# Patient Record
Sex: Male | Born: 1950 | Race: Asian | Hispanic: No | Marital: Married | Smoking: Former smoker
Health system: Southern US, Community
[De-identification: ages and names within clinical notes are randomized; demographics above are authoritative.]

## PROBLEM LIST (undated history)

## (undated) DIAGNOSIS — A809 Acute poliomyelitis, unspecified: Secondary | ICD-10-CM

## (undated) DIAGNOSIS — I10 Essential (primary) hypertension: Secondary | ICD-10-CM

## (undated) DIAGNOSIS — I639 Cerebral infarction, unspecified: Secondary | ICD-10-CM

## (undated) HISTORY — PX: NO PAST SURGERIES: SHX2092

## (undated) HISTORY — DX: Cerebral infarction, unspecified: I63.9

---

## 2006-04-21 ENCOUNTER — Ambulatory Visit: Payer: Self-pay | Admitting: Cardiology

## 2006-04-26 ENCOUNTER — Ambulatory Visit: Payer: Self-pay

## 2006-05-20 ENCOUNTER — Ambulatory Visit: Payer: Self-pay | Admitting: Cardiology

## 2006-06-03 ENCOUNTER — Ambulatory Visit: Payer: Self-pay | Admitting: Cardiology

## 2006-06-03 LAB — CONVERTED CEMR LAB
Chloride: 106 meq/L (ref 96–112)
Creatinine, Ser: 0.4 mg/dL (ref 0.4–1.5)
Glucose, Bld: 106 mg/dL — ABNORMAL HIGH (ref 70–99)
Potassium: 3.9 meq/L (ref 3.5–5.1)
Sodium: 142 meq/L (ref 135–145)

## 2009-07-22 ENCOUNTER — Encounter (INDEPENDENT_AMBULATORY_CARE_PROVIDER_SITE_OTHER): Payer: Self-pay | Admitting: *Deleted

## 2010-05-12 NOTE — Letter (Signed)
Summary: Colonoscopy Date Change Letter  [facility]  [address] [place], [place] [address]   Phone: [phone]  Fax: [phone]      July 22, 2009 MRN: [id number]   [facility] [address] [place], [place]  [address]   Dear Mr. Boyde,   Previously you were recommended to have a repeat colonoscopy around this time. Your chart was recently reviewed by Dr. Judie Petit T. Russella Dar of [facility]. Follow up colonoscopy is now recommended in March 2014. This revised recommendation is based on current, nationally recognized guidelines for colorectal cancer screening and polyp surveillance. These guidelines are endorsed by the American Cancer Society, [employer] on Colorectal Cancer as well as numerous other major medical organizations.  Please understand that our recommendation assumes that you do not have any new symptoms such as bleeding, a change in bowel habits, anemia, or significant abdominal discomfort. If you do have any concerning GI symptoms or want to discuss the guideline recommendations, please call to arrange an office visit at your earliest convenience. Otherwise we will keep you in our reminder system and contact you 1-2 months prior to the date listed above to schedule your next colonoscopy.  Thank you,  Judie Petit T. Russella Dar, M.D.  [facility] [phone]

## 2010-11-17 ENCOUNTER — Emergency Department (HOSPITAL_COMMUNITY): Payer: Medicare Other

## 2010-11-17 ENCOUNTER — Inpatient Hospital Stay (HOSPITAL_COMMUNITY)
Admission: EM | Admit: 2010-11-17 | Discharge: 2010-11-20 | DRG: 066 | Disposition: A | Discharge status: Home Health | Payer: Medicare Other | Attending: Neurology | Admitting: Neurology

## 2010-11-17 DIAGNOSIS — I619 Nontraumatic intracerebral hemorrhage, unspecified: Principal | ICD-10-CM | POA: Diagnosis present

## 2010-11-17 DIAGNOSIS — Z8673 Personal history of transient ischemic attack (TIA), and cerebral infarction without residual deficits: Secondary | ICD-10-CM

## 2010-11-17 DIAGNOSIS — Z8612 Personal history of poliomyelitis: Secondary | ICD-10-CM

## 2010-11-17 DIAGNOSIS — G8389 Other specified paralytic syndromes: Secondary | ICD-10-CM | POA: Diagnosis present

## 2010-11-17 DIAGNOSIS — I10 Essential (primary) hypertension: Secondary | ICD-10-CM | POA: Diagnosis present

## 2010-11-17 DIAGNOSIS — M216X9 Other acquired deformities of unspecified foot: Secondary | ICD-10-CM | POA: Diagnosis present

## 2010-11-17 DIAGNOSIS — F17200 Nicotine dependence, unspecified, uncomplicated: Secondary | ICD-10-CM | POA: Diagnosis present

## 2010-11-17 LAB — COMPREHENSIVE METABOLIC PANEL
Alkaline Phosphatase: 59 U/L (ref 39–117)
BUN: 21 mg/dL (ref 6–23)
Calcium: 9.7 mg/dL (ref 8.4–10.5)
Creatinine, Ser: 0.48 mg/dL — ABNORMAL LOW (ref 0.50–1.35)
GFR calc Af Amer: >60 mL/min (ref >60)
Glucose, Bld: 125 mg/dL — ABNORMAL HIGH (ref 70–99)
Total Protein: 8.1 g/dL (ref 6.0–8.3)

## 2010-11-17 LAB — CBC
HCT: 45.7 % (ref 39.0–52.0)
Hemoglobin: 16.1 g/dL (ref 13.0–17.0)
MCH: 30.7 pg (ref 26.0–34.0)
MCHC: 35.2 g/dL (ref 30.0–36.0)
MCV: 87.2 fL (ref 78.0–100.0)

## 2010-11-17 LAB — URINE MICROSCOPIC-ADD ON

## 2010-11-17 LAB — URINALYSIS, ROUTINE W REFLEX MICROSCOPIC
Ketones, ur: 15 mg/dL — AB
Leukocytes, UA: NEGATIVE
Protein, ur: NEGATIVE mg/dL
Urobilinogen, UA: 0.2 mg/dL (ref 0.0–1.0)

## 2010-11-17 LAB — CK TOTAL AND CKMB (NOT AT ARMC)
CK, MB: 2.2 ng/mL (ref 0.3–4.0)
Relative Index: INVALID (ref 0.0–2.5)
Total CK: 88 U/L (ref 7–232)

## 2010-11-17 LAB — DIFFERENTIAL
Lymphocytes Relative: 11 % — ABNORMAL LOW (ref 12–46)
Monocytes Absolute: 0.6 10*3/uL (ref 0.1–1.0)
Monocytes Relative: 5 % (ref 3–12)
Neutro Abs: 9.3 10*3/uL — ABNORMAL HIGH (ref 1.7–7.7)

## 2010-11-17 LAB — TROPONIN I: Troponin I: <0.3 ng/mL (ref <0.30)

## 2010-11-18 ENCOUNTER — Inpatient Hospital Stay (HOSPITAL_COMMUNITY): Payer: Medicare Other

## 2010-11-18 DIAGNOSIS — I517 Cardiomegaly: Secondary | ICD-10-CM

## 2010-11-19 DIAGNOSIS — I619 Nontraumatic intracerebral hemorrhage, unspecified: Secondary | ICD-10-CM

## 2010-12-03 NOTE — H&P (Signed)
NAMERAYDEL, HOSICK                  ACCOUNT NO.:  [id number]  MEDICAL RECORD NO.:  [id number]  LOCATION:  3104                         FACILITY:  [facility]  PHYSICIAN:  Marlan Palau, M.D.  DATE OF BIRTH:  1951/03/05  DATE OF ADMISSION:  11/17/2010 DATE OF DISCHARGE:                             HISTORY & PHYSICAL   This is a Neurology admission note on this patient who is being admitted on November 18, 2010.  HISTORY OF PRESENT ILLNESS:  [name] is a 60 year old left-handed [place] male, born on 1950-09-20, with a history of hypertension.  This patient comes to the [facility] Emergency Room for an evaluation of a headache that began approximately 5 days prior to this admission.  The patient began having dizziness and on the day prior to admission, had nausea and vomiting.  The patient noted no new numbness or weakness of the face, arms, or legs.  The patient denied any visual field changes, trouble with speech or swallowing.  This patient has significant deficits with right arm weakness, atrophy.  Both legs are weak since age 60.  The patient has undergone a CT scan of the brain that shows evidence of a small right medial thalamic intracranial hemorrhage with extension to the third ventricle without significant obstructive hydrocephalus.  The patient has evidence of basal ganglia lacunar type infarcts and left greater than right deep white matter infarcts that are chronic in nature.  Blood pressures upon arrival were 201/110.  The patient is being admitted for blood pressure management and treatment of the intracranial hemorrhage.  The patient is on a Cardene drip.  PAST MEDICAL HISTORY:  Significant for: 1. History of new onset of intracranial hemorrhage, headache as above     5 days prior to admission. 2. History of right arm weakness, bilateral leg weakness since age 31,     etiology unclear. 3. History of hypertension.  The patient has no surgical  history.  MEDICATIONS ON ADMISSION: 1. Hydrochlorothiazide 25 mg daily. 2. Naproxen if needed. 3. Tylenol if needed.  The patient has no known allergies.  Smokes about a pack of cigarettes a week.  Drinks alcohol on the form of beer on occasion.  SOCIAL HISTORY:  This patient is single, lives with the girlfriend.  Has one son, age 62, in the [place], [place] area.  The patient is on disability.  FAMILY MEDICAL HISTORY:  Mother is still living, has age 3, lives in [place], no known medical illnesses.  Father died of unknown cause.  The patient has one brother and one sister who are alive and well.  REVIEW OF SYSTEMS:  Notable for no recent fevers, chills.  The patient does note headache, dizziness, nausea, vomiting.  Denies shortness of breath, chest pains, abdominal pains.  Denies problems controlling the bowels or bladder.  Denies any blackout episodes or seizures.  PHYSICAL EXAMINATION:  VITAL SIGNS:  Blood pressure is 201/110 initially, heart rate 71, respiratory rate 18, temperature afebrile. GENERAL:  This patient is a fairly well-developed [place] male who is alert and cooperative at the time of examination. HEENT:  Head is atraumatic.  Eyes:  Pupils are 2-3 mm,  round, and reactive to light. NECK:  Supple.  No carotid bruits noted. RESPIRATORY:  Clear. CARDIOVASCULAR:  Reveals regular rate and rhythm.  No obvious murmurs or rubs noted. ABDOMEN:  Reveals positive bowel sounds.  No organomegaly or tenderness noted. EXTREMITIES:  Reveals some atrophy of the right arm as compared to the left.  The patient has a "frog leg" positioning of the legs bilaterally. NEUROLOGIC:  Facial symmetry is present.  The patient has full extraocular movements.  Visual fields are full.  Speech is normal.  No aphasia or dysarthria noted.  The patient has drift of the right arm to the bed, both legs drift to the bed with diffuse weakness in the right arm and both legs noted.  The  patient has significant bilateral foot drops.  The patient has good strength to the left arm.  No drift seen with left arm.  The patient has no obvious ataxia on the extremities. Pinprick, soft touch, and vibratory sensation are intact throughout.  No evidence of extinction is noted.  Deep tendon reflexes are depressed, but symmetric.  Toes are neutral bilaterally.  LABORATORY VALUES:  Notable for white count of 11.1, hemoglobin of 16.1, hematocrit of 45.7, MCV of 87.2, platelets of 230.  Sodium 135, potassium 3.2, chloride of 97, CO2 of 25, glucose of 125, BUN of 21, creatinine of 0.48.  Total bili 1.0, alk phosphatase of 59, SGOT of 28, SGPT of 36, total protein 8.1, albumin 4.2, calcium 9.7.  CK of 88, troponin-I of less than 0.3.  Urinalysis reveals specific gravity of 1.023, pH of 5.5, white count of 0-2, red cell count 3-6.  CT of the head is as above.  IMPRESSION: 1. New onset of medial right thalamic intracranial hemorrhage with     third ventricle extension. 2. Hypertension.  NIH stroke scale score is 7.  The patient is not a t-PA candidate secondary to duration of symptoms and intracranial hemorrhage.  The patient will be admitted for management of the bleed and for blood pressure control.  The patient will be admitted to the 3100 Intensive Care Unit.  Checks MRI of the brain and MRA of the head.  The patient will undergo Physical and Occupational Therapy evaluation, 2-D echocardiogram and carotid Doppler study.  We will follow the patient's clinical course while in- house.     Marlan Palau, M.D.     CKW/MEDQ  D:  11/18/2010  T:  11/18/2010  Job:  [id number]  cc:   [facility]  Electronically Signed by Thana Farr M.D. on 12/03/2010 09:24:05 AM

## 2010-12-09 NOTE — Discharge Summary (Signed)
  [name], [name]                  ACCOUNT NO.:  [id number]  MEDICAL RECORD NO.:  [id number]  LOCATION:  3104                         FACILITY:  [facility]  PHYSICIAN:  Pramod P. Pearlean Brownie, MD    DATE OF BIRTH:  [birth year]  DATE OF ADMISSION:  11/17/2010 DATE OF DISCHARGE:  11/20/2010                              DISCHARGE SUMMARY   ADMISSION DIAGNOSIS:  Intracerebral hemorrhage.  DISCHARGE DIAGNOSES: 1. Small right medial thalamic hemorrhage secondary to hypertension. 2. Hypertension. 3. 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  HOSPITAL COURSE:  Mr. [name] is a 60 year old [place] who was brought to [facility] Emergency Room for evaluation for headache that began 5 days prior to admission.  He also had some dizziness on the day prior to admission.  He had mild nausea and vomiting as well.  He denied any new focal weakness involving his body though he does have significant 3 extremity weakness from childhood, history of polio-like illness with wasting and atrophy in the muscles.  This has fallen him illness at age 47.  The patient had a CT scan of the head on admission which showed a small right middle thalamic hemorrhage with slight mass effect on third ventricle, but without hydrocephalus or intraventricular extension.  There was evidence of old-lacunar type infarcts in basal ganglia deep white matter bilaterally.  His blood pressure was elevated on admission.  He was started on Cardene drip for blood pressure control and transferred to the neurological intensive care unit where blood pressure was tightly controlled initially with an IV drip of nicardipine and subsequently his oral blood pressure medications were increased when he was able to swallow safely.  Complete metabolic panel labs on admission was unremarkable.  CBC was normal.  UA showed no evidence of infection and was normal.  MRSA screen was negative.  The patient was seen by physical, occupational,  speech therapy and was felt to be at his neurological baseline.  He was able to walk by himself with minimum assistance with bilateral foot drop.  He was even seen by inpatient rehab but was not felt to be a candidate for rehabilitation in the hospital.  On the day of discharge he was stable with blood pressure of 177/87.  MEDICATIONS:  At the time of discharge included clonidine 0.2 mg patch per day.  Hydrochlorothiazide 25 mg daily.  Lisinopril 5 mg daily. Potassium 20 mEq daily.  Tylenol p.r.n. for headache.  The patient was advised to follow up with Dr. Pearlean Brownie in his office in 2 months and with his primary care physician in a month.  He was also advised to maintain tight control of his hypertension.     Pramod P. Pearlean Brownie, MD    PPS/MEDQ  D:  11/20/2010  T:  11/20/2010  Job:  [id number]  Electronically Signed by Delia Heady MD on 12/09/2010 08:32:07 PM

## 2010-12-25 ENCOUNTER — Ambulatory Visit: Payer: Medicare Other | Attending: Neurology | Admitting: Physical Therapy

## 2011-01-22 ENCOUNTER — Other Ambulatory Visit: Payer: Self-pay | Admitting: Neurology

## 2011-01-22 DIAGNOSIS — I619 Nontraumatic intracerebral hemorrhage, unspecified: Secondary | ICD-10-CM

## 2012-06-09 ENCOUNTER — Encounter: Payer: Self-pay | Admitting: Gastroenterology

## 2013-08-22 ENCOUNTER — Encounter (HOSPITAL_COMMUNITY): Payer: Self-pay | Admitting: Emergency Medicine

## 2013-08-22 ENCOUNTER — Emergency Department (HOSPITAL_COMMUNITY): Payer: Medicare Other

## 2013-08-22 ENCOUNTER — Inpatient Hospital Stay (HOSPITAL_COMMUNITY)
Admission: EM | Admit: 2013-08-22 | Discharge: 2013-08-28 | DRG: 065 | Disposition: A | Discharge status: Rehabilitation Facility | Payer: Medicare Other | Attending: Neurology | Admitting: Neurology

## 2013-08-22 DIAGNOSIS — I498 Other specified cardiac arrhythmias: Secondary | ICD-10-CM | POA: Diagnosis present

## 2013-08-22 DIAGNOSIS — I10 Essential (primary) hypertension: Secondary | ICD-10-CM | POA: Diagnosis present

## 2013-08-22 DIAGNOSIS — E876 Hypokalemia: Secondary | ICD-10-CM | POA: Diagnosis present

## 2013-08-22 DIAGNOSIS — L8990 Pressure ulcer of unspecified site, unspecified stage: Secondary | ICD-10-CM | POA: Diagnosis present

## 2013-08-22 DIAGNOSIS — I619 Nontraumatic intracerebral hemorrhage, unspecified: Principal | ICD-10-CM | POA: Diagnosis present

## 2013-08-22 DIAGNOSIS — Z91199 Patient's noncompliance with other medical treatment and regimen due to unspecified reason: Secondary | ICD-10-CM

## 2013-08-22 DIAGNOSIS — F17200 Nicotine dependence, unspecified, uncomplicated: Secondary | ICD-10-CM | POA: Diagnosis present

## 2013-08-22 DIAGNOSIS — G8220 Paraplegia, unspecified: Secondary | ICD-10-CM | POA: Diagnosis not present

## 2013-08-22 DIAGNOSIS — D696 Thrombocytopenia, unspecified: Secondary | ICD-10-CM | POA: Diagnosis not present

## 2013-08-22 DIAGNOSIS — Z79899 Other long term (current) drug therapy: Secondary | ICD-10-CM

## 2013-08-22 DIAGNOSIS — Z5189 Encounter for other specified aftercare: Secondary | ICD-10-CM | POA: Diagnosis not present

## 2013-08-22 DIAGNOSIS — I517 Cardiomegaly: Secondary | ICD-10-CM | POA: Diagnosis not present

## 2013-08-22 DIAGNOSIS — R5383 Other fatigue: Secondary | ICD-10-CM | POA: Diagnosis not present

## 2013-08-22 DIAGNOSIS — I161 Hypertensive emergency: Secondary | ICD-10-CM

## 2013-08-22 DIAGNOSIS — I639 Cerebral infarction, unspecified: Secondary | ICD-10-CM | POA: Diagnosis present

## 2013-08-22 DIAGNOSIS — B91 Sequelae of poliomyelitis: Secondary | ICD-10-CM

## 2013-08-22 DIAGNOSIS — R52 Pain, unspecified: Secondary | ICD-10-CM

## 2013-08-22 DIAGNOSIS — Z9119 Patient's noncompliance with other medical treatment and regimen: Secondary | ICD-10-CM

## 2013-08-22 DIAGNOSIS — I6350 Cerebral infarction due to unspecified occlusion or stenosis of unspecified cerebral artery: Secondary | ICD-10-CM | POA: Diagnosis not present

## 2013-08-22 DIAGNOSIS — R918 Other nonspecific abnormal finding of lung field: Secondary | ICD-10-CM | POA: Diagnosis not present

## 2013-08-22 DIAGNOSIS — I6340 Cerebral infarction due to embolism of unspecified cerebral artery: Secondary | ICD-10-CM | POA: Diagnosis not present

## 2013-08-22 DIAGNOSIS — R5381 Other malaise: Secondary | ICD-10-CM | POA: Diagnosis not present

## 2013-08-22 DIAGNOSIS — M25579 Pain in unspecified ankle and joints of unspecified foot: Secondary | ICD-10-CM | POA: Diagnosis not present

## 2013-08-22 DIAGNOSIS — L89899 Pressure ulcer of other site, unspecified stage: Secondary | ICD-10-CM | POA: Diagnosis present

## 2013-08-22 DIAGNOSIS — R404 Transient alteration of awareness: Secondary | ICD-10-CM | POA: Diagnosis not present

## 2013-08-22 HISTORY — DX: Acute poliomyelitis, unspecified: A80.9

## 2013-08-22 HISTORY — DX: Essential (primary) hypertension: I10

## 2013-08-22 LAB — CBC
HCT: 45.6 % (ref 39.0–52.0)
Hemoglobin: 15.7 g/dL (ref 13.0–17.0)
MCH: 30.8 pg (ref 26.0–34.0)
MCHC: 34.4 g/dL (ref 30.0–36.0)
MCV: 89.4 fL (ref 78.0–100.0)
Platelets: 158 10*3/uL (ref 150–400)
RBC: 5.1 MIL/uL (ref 4.22–5.81)
RDW: 13.2 % (ref 11.5–15.5)
WBC: 5.5 10*3/uL (ref 4.0–10.5)

## 2013-08-22 LAB — URINALYSIS, ROUTINE W REFLEX MICROSCOPIC
Bilirubin Urine: NEGATIVE
GLUCOSE, UA: NEGATIVE mg/dL
Hgb urine dipstick: NEGATIVE
Ketones, ur: NEGATIVE mg/dL
LEUKOCYTES UA: NEGATIVE
Nitrite: NEGATIVE
Protein, ur: NEGATIVE mg/dL
SPECIFIC GRAVITY, URINE: 1.014 (ref 1.005–1.030)
Urobilinogen, UA: 1 mg/dL (ref 0.0–1.0)
pH: 7 (ref 5.0–8.0)

## 2013-08-22 LAB — I-STAT TROPONIN, ED: Troponin i, poc: 0.05 ng/mL (ref 0.00–0.08)

## 2013-08-22 LAB — BASIC METABOLIC PANEL
BUN: 23 mg/dL (ref 6–23)
CO2: 24 mEq/L (ref 19–32)
Calcium: 9.5 mg/dL (ref 8.4–10.5)
Chloride: 102 mEq/L (ref 96–112)
Creatinine, Ser: 0.53 mg/dL (ref 0.50–1.35)
GFR calc Af Amer: >90 mL/min (ref >90)
GFR calc non Af Amer: >90 mL/min (ref >90)
Glucose, Bld: 112 mg/dL — ABNORMAL HIGH (ref 70–99)
Potassium: 3.9 mEq/L (ref 3.7–5.3)
Sodium: 141 mEq/L (ref 137–147)

## 2013-08-22 LAB — GLUCOSE, CAPILLARY: GLUCOSE-CAPILLARY: 94 mg/dL (ref 70–99)

## 2013-08-22 LAB — MRSA PCR SCREENING: MRSA by PCR: NEGATIVE

## 2013-08-22 LAB — CBG MONITORING, ED: Glucose-Capillary: 91 mg/dL (ref 70–99)

## 2013-08-22 MED ORDER — ATROPINE SULFATE 1 MG/ML IJ SOLN: 0.5000 mg | Freq: Once | INTRAMUSCULAR | Status: DC

## 2013-08-22 MED ORDER — HYDROCHLOROTHIAZIDE 25 MG PO TABS
25.0000 mg | ORAL_TABLET | Freq: Every day | ORAL | Status: DC
  Administered 2013-08-22 – 2013-08-28 (×7): 25 mg via ORAL
  Filled 2013-08-22 (×7): qty 1

## 2013-08-22 MED ORDER — NICARDIPINE HCL IN NACL 20-0.86 MG/200ML-% IV SOLN: 3.0000 mg/h | INTRAVENOUS | Status: DC

## 2013-08-22 MED ORDER — SENNOSIDES-DOCUSATE SODIUM 8.6-50 MG PO TABS
1.0000 | ORAL_TABLET | Freq: Two times a day (BID) | ORAL | Status: DC
  Administered 2013-08-23 – 2013-08-28 (×10): 1 via ORAL
  Filled 2013-08-22 (×13): qty 1

## 2013-08-22 MED ORDER — SODIUM CHLORIDE 0.9 % IV SOLN
INTRAVENOUS | Status: DC
  Administered 2013-08-23 – 2013-08-27 (×6): via INTRAVENOUS

## 2013-08-22 MED ORDER — ATROPINE SULFATE 0.1 MG/ML IJ SOLN
1.0000 mg | Freq: Once | INTRAMUSCULAR | Status: AC
  Administered 2013-08-22: 0.5 mg via INTRAVENOUS

## 2013-08-22 MED ORDER — ACETAMINOPHEN 650 MG RE SUPP: 650.0000 mg | RECTAL | Status: DC | PRN

## 2013-08-22 MED ORDER — LISINOPRIL 10 MG PO TABS
10.0000 mg | ORAL_TABLET | Freq: Once | ORAL | Status: AC
  Administered 2013-08-22: 10 mg via ORAL
  Filled 2013-08-22: qty 1

## 2013-08-22 MED ORDER — SODIUM CHLORIDE 0.9 % IV BOLUS (SEPSIS)
1000.0000 mL | Freq: Once | INTRAVENOUS | Status: AC
  Administered 2013-08-22: 1000 mL via INTRAVENOUS

## 2013-08-22 MED ORDER — CLONIDINE HCL 0.2 MG PO TABS
0.2000 mg | ORAL_TABLET | Freq: Once | ORAL | Status: AC
  Administered 2013-08-22: 0.2 mg via ORAL
  Filled 2013-08-22: qty 1

## 2013-08-22 MED ORDER — NICARDIPINE HCL IN NACL 20-0.86 MG/200ML-% IV SOLN
3.0000 mg/h | INTRAVENOUS | Status: DC
  Administered 2013-08-22 – 2013-08-23 (×2): 5 mg/h via INTRAVENOUS
  Administered 2013-08-23: 3 mg/h via INTRAVENOUS
  Administered 2013-08-23 (×2): 5 mg/h via INTRAVENOUS
  Administered 2013-08-24: 1.5 mg/h via INTRAVENOUS
  Administered 2013-08-24 – 2013-08-26 (×5): 5 mg/h via INTRAVENOUS
  Filled 2013-08-22 (×13): qty 200

## 2013-08-22 MED ORDER — ACETAMINOPHEN 325 MG PO TABS: 650.0000 mg | ORAL_TABLET | ORAL | Status: DC | PRN

## 2013-08-22 MED ORDER — PANTOPRAZOLE SODIUM 40 MG IV SOLR
40.0000 mg | Freq: Every day | INTRAVENOUS | Status: DC
  Administered 2013-08-22: 40 mg via INTRAVENOUS
  Filled 2013-08-22 (×2): qty 40
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
## 2013-08-22 NOTE — H&P (Addendum)
H&P    Chief Complaint: ICH  HPI:                                                                                                                                         [name] is an 63 y.o. male who has not been feeling well for the last 10 days.  He describes a feeling of generalized weakness. Per daughter he is able to walk with assistance of cain but over last few days he has been so weak he cannot walk.  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 Per daughter he takes his BP medications only when he feels ill but not when he is feeling good--same with ASA. He has not seen his primary MD since early 2014. Due to his generalized weakness he was brought to the ED.  One in the ED he was noted to have BP 240/108, 235/111, 264/140.  While in the ED he was noted to be drowsy and HR dropped to 47. 0.5 mg Atropine were given and HR increased to 47.  On consultation he was noted to have BP of 201/89 and nicardipine was started. HE is awake and oriented and only complaint is general weakness.   Date last known well: Date: 08/12/2013 Time last known well: Unable to determine tPA Given: No: ICH  Past Medical History  Diagnosis Date  . Hypertension   . Polio     History reviewed. No pertinent past surgical history.  Family History  Problem Relation Age of Onset  . Hypertension Mother   . Hypertension Father    Social History:  reports that he has been smoking Cigarettes.  He has been smoking about 0.00 packs per day. He does not have any smokeless tobacco history on file. He reports that he does not drink alcohol or use illicit drugs.  Allergies: No Known Allergies  Medications:                                                                                                                           Current Facility-Administered Medications  Medication Dose Route Frequency Provider Last Rate Last Dose  . hydrochlorothiazide  (HYDRODIURIL) tablet 25 mg  25 mg Oral Daily Virgel Manifold, MD   25 mg at 08/22/13 1303  . niCARdipine (CARDENE-IV) infusion (0.1  mg/ml)  3-15 mg/hr Intravenous Continuous Marliss Coots, PA-C       Current Outpatient Prescriptions  Medication Sig Dispense Refill  . cloNIDine (CATAPRES) 0.2 MG tablet Take 0.2 mg by mouth 2 (two) times daily.      . hydrochlorothiazide (HYDRODIURIL) 25 MG tablet Take 25 mg by mouth daily.      Marland Kitchen lisinopril (PRINIVIL,ZESTRIL) 10 MG tablet Take 10 mg by mouth daily.      . naproxen (NAPROSYN) 500 MG tablet Take 500 mg by mouth 2 (two) times daily with a meal.         ROS:                                                                                                                                       History obtained from the patient  General ROS: negative for - chills, fatigue, fever, night sweats, weight gain or weight loss Psychological ROS: negative for - behavioral disorder, hallucinations, memory difficulties, mood swings or suicidal ideation Ophthalmic ROS: negative for - blurry vision, double vision, eye pain or loss of vision ENT ROS: negative for - epistaxis, nasal discharge, oral lesions, sore throat, tinnitus or vertigo Allergy and Immunology ROS: negative for - hives or itchy/watery eyes Hematological and Lymphatic ROS: negative for - bleeding problems, bruising or swollen lymph nodes Endocrine ROS: negative for - galactorrhea, hair pattern changes, polydipsia/polyuria or temperature intolerance Respiratory ROS: negative for - cough, hemoptysis, shortness of breath or wheezing Cardiovascular ROS: negative for - chest pain, dyspnea on exertion, edema or irregular heartbeat Gastrointestinal ROS: negative for - abdominal pain, diarrhea, hematemesis, nausea/vomiting or stool incontinence Genito-Urinary ROS: negative for - dysuria, hematuria, incontinence or urinary frequency/urgency Musculoskeletal ROS: negative for - joint swelling or muscular  weakness Neurological ROS: as noted in HPI Dermatological ROS: negative for rash and skin lesion changes  Neurologic Examination:                                                                                                      Blood pressure 164/94, pulse 49, temperature 98.8 F (37.1 C), temperature source Oral, resp. rate 20, SpO2 100.00%.  General: CV: S1, S2 Abd: S ND/NT Resp: CTAB Skin: WDI  Mental Status: Alert, oriented, thought content appropriate.  Speech fluent without evidence of aphasia.  Able to follow simple commands without difficulty. Cranial Nerves: II: Discs flat bilaterally; Visual fields grossly normal, pupils equal, round, reactive to light and accommodation III,IV, VI: ptosis not present,  extra-ocular motions intact bilaterally V,VII: smile symmetric, facial light touch sensation normal bilaterally VIII: hearing normal bilaterally IX,X: gag reflex present XI: bilateral shoulder shrug weak XII: midline tongue extension   Motor: Right grip moderately strong, unable to flex extend elbow and left right arm off the bed.  He has polio which has left him with old right sided weakness.  Left grip is strong, 4/5 left bicep flexion, no triceps extension or shoulder abduction. Unable to flex bilateral hips, flex and extend knees bilaterally. (pateint keeps stating he is too weak).  HE is able to plantar flex bilateral ankles and shows no DF.  Sensory: Pinprick and light touch intact throughout, bilaterally Deep Tendon Reflexes:  Right: Upper Extremity   Left: Upper extremity   biceps (C-5 to C-6) 2/4   biceps (C-5 to C-6) 2/4 tricep (C7) 2/4    triceps (C7) 2/4 Brachioradialis (C6) 2/4  Brachioradialis (C6) 2/4  Lower Extremity Lower Extremity  quadriceps (L-2 to L-4) 1/4   quadriceps (L-2 to L-4) 1/4 Achilles (S1) 2/4   Achilles (S1) 1/4  Plantars: Right: downgoing   Left: downgoing Cerebellar: Unable to take part in .  Gait: not tested.  CV: pulses  palpable throughout    Lab Results: Basic Metabolic Panel:  Recent Labs Lab 08/22/13 1150  NA 141  K 3.9  CL 102  CO2 24  GLUCOSE 112*  BUN 23  CREATININE 0.53  CALCIUM 9.5    Liver Function Tests: No results found for this basename: AST, ALT, ALKPHOS, BILITOT, PROT, ALBUMIN,  in the last 168 hours No results found for this basename: LIPASE, AMYLASE,  in the last 168 hours No results found for this basename: AMMONIA,  in the last 168 hours  CBC:  Recent Labs Lab 08/22/13 1150  WBC 5.5  HGB 15.7  HCT 45.6  MCV 89.4  PLT 158    Cardiac Enzymes: No results found for this basename: CKTOTAL, CKMB, CKMBINDEX, TROPONINI,  in the last 168 hours  Lipid Panel: No results found for this basename: CHOL, TRIG, HDL, CHOLHDL, VLDL, LDLCALC,  in the last 168 hours  CBG: No results found for this basename: GLUCAP,  in the last 168 hours  Microbiology: Results for orders placed during the hospital encounter of 11/17/10  MRSA PCR SCREENING     Status: None   Collection Time    11/18/10  3:37 AM      Result Value Ref Range Status   MRSA by PCR NEGATIVE  NEGATIVE Final   Comment:            The GeneXpert MRSA Assay (FDA     approved for NASAL specimens     only), is one component of a     comprehensive MRSA colonization     surveillance program. It is not     intended to diagnose MRSA     infection nor to guide or     monitor treatment for     MRSA infections.    Coagulation Studies: No results found for this basename: LABPROT, INR,  in the last 72 hours  Imaging: Ct Head Wo Contrast  08/22/2013   CLINICAL DATA:  WEAKNESS  EXAM: CT HEAD WITHOUT CONTRAST  TECHNIQUE: Contiguous axial images were obtained from the base of the skull through the vertex without intravenous contrast.  COMPARISON:  MR MRA HEAD W/O CM dated 11/18/2010  FINDINGS: A 1.5 x 1.6 cm intraparenchymal hemorrhage within the right basal ganglial region. There is mild surrounding  edema. No evidence of  subfalcine or tonsillar herniation. Ventricles and cisterns are patent. There is mild diffuse global atrophy. Diffuse areas of low attenuation within the subcortical, deep, and periventricular white matter regions consistent with small vessel white matter ischaemia all. Focal area of low attenuation within the basal ganglia on the left and right consistent with chronic lacunar infarctions. The visualized paranasal sinuses and mastoid air cells are patent. The calvarium is unremarkable.  IMPRESSION: Acute Intraparenchymal hemorrhage right basal ganglial. Critical Value/emergent results were called by telephone at the time of interpretation on 08/22/2013 at 2:20 PM to Dr. Virgel Manifold , who verbally acknowledged these results.  Chronic and involutional changes.   Electronically Signed   By: Margaree Mackintosh M.D.   On: 08/22/2013 14:21       Assessment and plan discussed with with attending physician and they are in agreement.    Etta Quill PA-C [facility] [phone]  08/22/2013, 3:13 PM  I have seen and evaluated the patient. I have reviewed the above note and made appropriate changes.     Assessment: 63 y.o. male with past history of Polio leaving him with right arm and leg weakness.  Over past 10 days he has noted generalized weakness which I suspect is actually weakness of his dominant side.  On admission BP 240/140 and CT head showing acute right BG hemorrhage.    Stroke Risk Factors - hypertension  Plan: 1) Admit to ICU 2) BP control with gaol SBP <160 3) hold all antiplatelet 4) repeat head CT in AM  Asymptomatic bradycardia- Continue to monitor.    This patient is critically ill and at significant risk of neurological worsening, death and care requires constant monitoring of vital signs, hemodynamics,respiratory and cardiac monitoring, neurological assessment, discussion with family, other specialists and medical decision making of high complexity. I spent 45 minutes  of neurocritical care time  in the care of  this patient.  Roland Rack, MD [facility] [phone]  If 7pm- 7am, please page neurology on call as listed in [place]. 08/22/2013  7:13 PM

## 2013-08-22 NOTE — ED Notes (Signed)
Social Worker at bedside talking with daughter about Medical Power Attorney status.

## 2013-08-22 NOTE — ED Notes (Signed)
Pt has hx of high blood pressure-- states not taking medicine like supposed to-- took it yesterday, hx of polio as a child. States feels weak for 10 days, and takes BP medicine when feeling bad. Pt is montagnard-- daughter at bedside.

## 2013-08-22 NOTE — ED Notes (Signed)
Pt presents to department for evaluation of weakness to bilateral legs x10 day. States difficulty standing and walking. States he feels very fatigued. He is alert and oriented x4. History of polio as a child. Also states hypertension, but not taking medications at the time.

## 2013-08-22 NOTE — ED Notes (Signed)
CBG 91 

## 2013-08-23 ENCOUNTER — Inpatient Hospital Stay (HOSPITAL_COMMUNITY): Payer: Medicare Other

## 2013-08-23 DIAGNOSIS — I6350 Cerebral infarction due to unspecified occlusion or stenosis of unspecified cerebral artery: Secondary | ICD-10-CM

## 2013-08-23 LAB — LIPID PANEL
CHOL/HDL RATIO: 4.6 ratio
CHOLESTEROL: 157 mg/dL (ref 0–200)
HDL: 34 mg/dL — AB (ref >39)
LDL Cholesterol: 89 mg/dL (ref 0–99)
Triglycerides: 168 mg/dL — ABNORMAL HIGH (ref <150)
VLDL: 34 mg/dL (ref 0–40)

## 2013-08-23 LAB — GLUCOSE, CAPILLARY
GLUCOSE-CAPILLARY: 90 mg/dL (ref 70–99)
Glucose-Capillary: 102 mg/dL — ABNORMAL HIGH (ref 70–99)
Glucose-Capillary: 106 mg/dL — ABNORMAL HIGH (ref 70–99)
Glucose-Capillary: 132 mg/dL — ABNORMAL HIGH (ref 70–99)

## 2013-08-23 MED ORDER — PANTOPRAZOLE SODIUM 40 MG PO TBEC
40.0000 mg | DELAYED_RELEASE_TABLET | Freq: Every day | ORAL | Status: DC
  Administered 2013-08-23 – 2013-08-27 (×5): 40 mg via ORAL
  Filled 2013-08-23 (×5): qty 1

## 2013-08-23 MED FILL — Medication: Qty: 1 | Status: AC

## 2013-08-23 NOTE — Evaluation (Signed)
Physical Therapy Evaluation Patient Details Name: [name] MRN: [id number] DOB: 08/06/1950 Today's Date: 08/23/2013   History of Present Illness  [name] is a 63 y.o. male who had not been feeling well for the last 10 days. He describes a feeling of generalized weakness. Per daughter he is able to walk with assistance of cane but over last few days he has been so weak he cannot walk. 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 Per daughter he takes his BP medications only when he feels ill but not when he is feeling good--same with ASA. He has not seen his primary MD since early 2014. Due to his generalized weakness he was brought to the ED. One in the ED he was noted to have BP 240/108, 235/111, 264/140. While in the ED he was noted to be drowsy and HR dropped to 47.   Clinical Impression  Pt admitted with above. Pt currently with functional limitations due to the deficits listed below (see PT Problem List). Pt will benefit from skilled PT to increase their independence and safety with mobility to allow discharge to the venue listed below.     Follow Up Recommendations CIR;Supervision/Assistance - 24 hour    Equipment Recommendations  Other (comment) (TBA)    Recommendations for Other Services Rehab consult     Precautions / Restrictions Precautions Precautions: Fall Restrictions Weight Bearing Restrictions: No      Mobility  Bed Mobility Overal bed mobility: Needs Assistance;+2 for physical assistance Bed Mobility: Supine to Sit     Supine to sit: Max assist;+2 for physical assistance     General bed mobility comments: Pt needed assist for LEs and elevation of trunk.  Transfers                 General transfer comment: Unable to stand pt as he only had active movement trace knee on left in sitting.  Feet plantar flexed.    Ambulation/Gait                Stairs            Wheelchair  Mobility    Modified Rankin (Stroke Patients Only) Modified Rankin (Stroke Patients Only) Pre-Morbid Rankin Score: Moderately severe disability Modified Rankin: Severe disability     Balance Overall balance assessment: Needs assistance;History of Falls Sitting-balance support: Bilateral upper extremity supported;Feet supported Sitting balance-Leahy Scale: Poor Sitting balance - Comments: Pt initially needing mod to max assist to sit EOB.As pt sat, he was able to obtain balanc with UE support and did maintain balance with min guard assist for brief episode of time.  Pt kept asking to lie down and BP 196/99 therefore did lie pt back down and notified nursing.   Postural control: Left lateral lean;Posterior lean                                   Pertinent Vitals/Pain BP 196/99 sitting EOB.  Nursing made aware.  Other VSS, No pain    Home Living Family/patient expects to be discharged to:: Private residence Living Arrangements: Children Available Help at Discharge: Family;Other (Comment) (unsure)             Additional Comments: No family present so unsure of home situation or assist level PTA.    Prior Function Level of Independence: Needs assistance   Gait / Transfers Assistance  Needed: per chart, ambulated with cane PTA.           Hand Dominance        Extremity/Trunk Assessment   Upper Extremity Assessment: Defer to OT evaluation           Lower Extremity Assessment: RLE deficits/detail;LLE deficits/detail RLE Deficits / Details: Noted 1/5 movement at knee but no active movement noted in hip and ankle.   Lying in frog leg position on arrival.  Pt with significant plantar flexion as well as external hip rotation.   LLE Deficits / Details: Ankle no active movement noted with significant plantar flexion and external rotation of foot.  Knee 2/5 movement and hip 2-/5.  Keeps LE in frog leg position in supine and sitting.  Cervical / Trunk  Assessment: Kyphotic  Communication   Communication: Expressive difficulties  Cognition Arousal/Alertness: Awake/alert Behavior During Therapy: Flat affect Overall Cognitive Status: Difficult to assess                      General Comments      Exercises General Exercises - Lower Extremity Ankle Circles/Pumps: AAROM;Both;5 reps;Supine Heel Slides: AAROM;Both;10 reps;Supine      Assessment/Plan    PT Assessment Patient needs continued PT services  PT Diagnosis Generalized weakness   PT Problem List Decreased activity tolerance;Decreased balance;Decreased mobility;Decreased knowledge of use of DME;Decreased safety awareness;Decreased knowledge of precautions  PT Treatment Interventions DME instruction;Gait training;Functional mobility training;Therapeutic activities;Therapeutic exercise;Balance training;Neuromuscular re-education;Cognitive remediation;Patient/family education   PT Goals (Current goals can be found in the Care Plan section) Acute Rehab PT Goals Patient Stated Goal: pt unable to state PT Goal Formulation: With patient Time For Goal Achievement: 08/30/13 Potential to Achieve Goals: Good    Frequency Min 3X/week   Barriers to discharge Decreased caregiver support unsure of family support    Co-evaluation               End of Session Equipment Utilized During Treatment: Gait belt Activity Tolerance: Patient limited by fatigue Patient left: in bed;with call bell/phone within reach Nurse Communication: Mobility status;Need for lift equipment         Time: 0918-0940 PT Time Calculation (min): 22 min   Charges:   PT Evaluation $Initial PT Evaluation Tier I: 1 Procedure PT Treatments $Therapeutic Activity: 8-22 mins   PT G Codes:          Christianne Dolin 08/28/13, 10:22 AM [facility] [phone] (pager)

## 2013-08-23 NOTE — Progress Notes (Signed)
Stroke Team Progress Note  HISTORY [name] is an 63 y.o. male who has not been feeling well for the last 10 days. He describes a feeling of generalized weakness. Per daughter he is able to walk with assistance of cain but over last few days he has been so weak he cannot walk. 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 Per daughter he takes his BP medications only when he feels ill but not when he is feeling good--same with ASA. He has not seen his primary MD since early 2014. Due to his generalized weakness he was brought to the ED. One in the ED he was noted to have BP 240/108, 235/111, 264/140. While in the ED he was noted to be drowsy and HR dropped to 47. 0.5 mg Atropine were given and HR increased to 47. On consultation he was noted to have BP of 201/89 and nicardipine was started. HE is awake and oriented and only complaint is general weakness.  Date last known well: Date: 08/12/2013  Time last known well: Unable to determine  tPA Given: No: ICH   . He was admitted to the medical ICU for further evaluation and treatment. Overnight his blood pressure has been well controlled and he has remained neurologically stable. He also has a remote history of right thalamic intracerebral hemorrhage in 2012 from which he apparently made good recovery. He has history of childhood polio with significant deformities in both feet and hands but at baseline is able to ambulate with a cane  SUBJECTIVE His wife is at the bedside.  Overall he feels his condition is unchanged.    OBJECTIVE Most recent Vital Signs: Filed Vitals:   08/23/13 1530 08/23/13 1545 08/23/13 1600 08/23/13 1700  BP: 129/68 154/60 144/64 142/69  Pulse: 52 54 51 56  Temp:   98.7 F (37.1 C)   TempSrc:   Oral   Resp: 18 20 14 22   Height:      Weight:      SpO2: 100% 96% 100% 94%   CBG (last 3)   Recent Labs  08/23/13 0740 08/23/13 1217 08/23/13 1605  GLUCAP 90 102*  106*    IV Fluid Intake:   . sodium chloride 75 mL/hr at 08/23/13 1257  . niCARDipine 5 mg/hr (08/23/13 1700)    MEDICATIONS  . hydrochlorothiazide  25 mg Oral Daily  . pantoprazole  40 mg Oral QHS  . senna-docusate  1 tablet Oral BID   PRN:  acetaminophen, acetaminophen  Diet:  Cardiac   Activity:  Bedrest  DVT Prophylaxis:  SCDs CLINICALLY SIGNIFICANT STUDIES Basic Metabolic Panel:  Recent Labs Lab 08/22/13 1150  NA 141  K 3.9  CL 102  CO2 24  GLUCOSE 112*  BUN 23  CREATININE 0.53  CALCIUM 9.5   Liver Function Tests: No results found for this basename: AST, ALT, ALKPHOS, BILITOT, PROT, ALBUMIN,  in the last 168 hours CBC:  Recent Labs Lab 08/22/13 1150  WBC 5.5  HGB 15.7  HCT 45.6  MCV 89.4  PLT 158   Coagulation: No results found for this basename: LABPROT, INR,  in the last 168 hours Cardiac Enzymes: No results found for this basename: CKTOTAL, CKMB, CKMBINDEX, TROPONINI,  in the last 168 hours Urinalysis:  Recent Labs Lab 08/22/13 [place] 7.0  [place]  1.0  NITRITE NEGATIVE  LEUKOCYTESUR NEGATIVE   Lipid Panel No results found for this basename: chol, trig, hdl, cholhdl, vldl, ldlcalc   HgbA1C  No results found for this basename: HGBA1C    Urine Drug Screen:   No results found for this basename: labopia, cocainscrnur, labbenz, amphetmu, thcu, labbarb    Alcohol Level: No results found for this basename: ETH,  in the last 168 hours  Ct Head Wo Contrast  08/23/2013   CLINICAL DATA:  Followup intracranial hemorrhage  EXAM: CT HEAD WITHOUT CONTRAST  TECHNIQUE: Contiguous axial images were obtained from the base of the skull through the vertex without intravenous contrast.  COMPARISON:  08/22/2013  FINDINGS: Bony calvarium is intact.  Atrophic changes are seen. There is again noted a a an area of acute  hemorrhage within the right basal ganglia. It measures approximately 18 x 18 mm in greatest dimension. This is a slight increase of 2-3 mm in the each dimension from the prior exam. No significant midline shift is noted. Chronic white matter ischemic changes are seen. No other areas of hemorrhage are noted.  IMPRESSION: Slight increase in the right basal ganglia hemorrhage as described above.  Atrophic changes.  No other acute abnormality is noted.   Electronically Signed   By: Inez Catalina M.D.   On: 08/23/2013 03:00   Ct Head Wo Contrast  08/22/2013   CLINICAL DATA:  WEAKNESS  EXAM: CT HEAD WITHOUT CONTRAST  TECHNIQUE: Contiguous axial images were obtained from the base of the skull through the vertex without intravenous contrast.  COMPARISON:  MR MRA HEAD W/O CM dated 11/18/2010  FINDINGS: A 1.5 x 1.6 cm intraparenchymal hemorrhage within the right basal ganglial region. There is mild surrounding edema. No evidence of subfalcine or tonsillar herniation. Ventricles and cisterns are patent. There is mild diffuse global atrophy. Diffuse areas of low attenuation within the subcortical, deep, and periventricular white matter regions consistent with small vessel white matter ischaemia all. Focal area of low attenuation within the basal ganglia on the left and right consistent with chronic lacunar infarctions. The visualized paranasal sinuses and mastoid air cells are patent. The calvarium is unremarkable.  IMPRESSION: Acute Intraparenchymal hemorrhage right basal ganglial. Critical Value/emergent results were called by telephone at the time of interpretation on 08/22/2013 at 2:20 PM to Dr. Virgel Manifold , who verbally acknowledged these results.  Chronic and involutional changes.   Electronically Signed   By: Margaree Mackintosh M.D.   On: 08/22/2013 14:21    CT of the brain    MRI of the brain  ordered  MRA of the brain  Not ordered  2D Echocardiogram  ordered  Carotid Doppler  ordered  CXR  ordered  EKG  Sinus bradycardia Probable left atrial enlargement LVH with secondary repolarization abnormality Anterior ST elevation, probably due to LVH   Therapy Recommendations pending  Physical Exam  Frail elderly [place] male not in distress.Awake alert. Afebrile. Head is nontraumatic. Neck is supple without bruit. Hearing is normal. Cardiac exam no murmur or gallop. Lungs are clear to auscultation. Distal pulses are well felt. Mental Status:  Alert, oriented, thought content appropriate. Speech fluent without evidence of aphasia. Able to follow simple commands without difficulty.  Cranial Nerves:  II: Discs flat bilaterally; Visual fields grossly normal, pupils equal, round, reactive to light and accommodation  III,IV, VI: ptosis not present, extra-ocular motions intact bilaterally  V,VII: smile symmetric, facial light touch sensation normal bilaterally  VIII: hearing normal bilaterally  IX,X: gag  reflex present  XI: bilateral shoulder shrug weak  XII: midline tongue extension  Motor:  Right grip moderately strong, unable to flex extend elbow and left right arm off the bed. He has polio which has left him with old right sided weakness. Left grip is strong, 4/5 left bicep flexion, no triceps extension or shoulder abduction. Unable to flex bilateral hips, flex and extend knees bilaterally. (pateint keeps stating he is too weak). HE is able to plantar flex bilateral ankles and shows no DF.  Sensory: Pinprick and light touch intact throughout, bilaterally  Deep Tendon Reflexes:  Right: Upper Extremity Left: Upper extremity  biceps (C-5 to C-6) 2/4 biceps (C-5 to C-6) 2/4  tricep (C7) 2/4 triceps (C7) 2/4  Brachioradialis (C6) 2/4 Brachioradialis (C6) 2/4  Lower Extremity Lower Extremity  quadriceps (L-2 to L-4) 1/4 quadriceps (L-2 to L-4) 1/4  Achilles (S1) 2/4 Achilles (S1) 1/4  Plantars:  Right: downgoing Left: downgoing  Cerebellar:  Unable to take part in .  Gait: not tested.    ASSESSMENT Mr. [name] is a 63 y.o. male presenting with  Generalized weakness x 10 days.  . 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  On no prior to admission. Now on no anticoagulants for secondary stroke prevention. Patient with resultant  Paraparesis and left UE weanesss. Stroke work up underway.   Malignant HT  LDL pending  Childhood [facility] day # 1  TREATMENT/PLAN Continue ICU level of care with strict blood pressure control with Cardene drip and SBP goal below 16 0 for24 hours and then below 180. Close neurological monitoring. Repeat CT head without contrast to look for hematoma growth. Maintain euglycemia, normothermia and euvolemia.  Check swallow evaluation and resume home medications if able to swallow.  Check MRI scan of the brain, echocardiogram, Doppler studies, lipid profile.  This patient is critically ill and at significant risk of neurological worsening, death and care requires constant monitoring of vital signs, hemodynamics,respiratory and cardiac monitoring,review of multiple databases, neurological assessment, discussion with family, other specialists and medical decision making of high complexity. I spent 40 minutes of neurocritical care time  in the care of  this patient.  Antony Contras, MD 08/23/2013 6:41 PM      To contact Stroke Continuity provider, please refer to [email or web address] After hours, contact General Neurology

## 2013-08-23 NOTE — Progress Notes (Signed)
Rehab Admissions Coordinator Note:  Patient was screened by Retta Diones for appropriateness for an Inpatient Acute Rehab Consult.  At this time, we are recommending Inpatient Rehab consult.  [name] 08/23/2013, 11:06 AM  I can be reached at [phone].

## 2013-08-23 NOTE — Evaluation (Addendum)
Clinical/Bedside Swallow Evaluation Patient Details  Name: [name] MRN: [id number] Date of Birth: 08-Jul-1950  Today's Date: 08/23/2013 Time: 1000-1015 SLP Time Calculation (min): 15 min  Past Medical History:  Past Medical History  Diagnosis Date  . Hypertension   . Polio    Past Surgical History: History reviewed. No pertinent past surgical history. HPI:  63 y.o. male with past history of Polio leaving him with right arm and leg weakness. Admitted with generalized waekness. On admission BP 240/140 and CT head showed acute right BG hemorrhage.    Assessment / Plan / Recommendation Clinical Impression  Patient presents with a functional oropharyngeal swallow without overt indication of aspiration. No SLP f/u indicated for swallow at this time. Language barrier prevented screening of cognitive-linguistic function at this time although overall appeared intact for tasks provided. Will f/u for full cognitive-linguistic evaluation with interpreter 5/15.     Aspiration Risk  Mild    Diet Recommendation Regular;Thin liquid   Liquid Administration via: Cup;Straw Medication Administration: Whole meds with liquid Supervision: Patient able to self feed (may need staff assist) Compensations: Slow rate;Small sips/bites Postural Changes and/or Swallow Maneuvers: Seated upright 90 degrees    Other  Recommendations Oral Care Recommendations: Oral care BID   Follow Up Recommendations  None (for swallow)    Frequency and Duration        Pertinent Vitals/Pain n/a        Swallow Study    General HPI: 63 y.o. male with past history of Polio leaving him with right arm and leg weakness. Admitted with generalized waekness. On admission BP 240/140 and CT head showed acute right BG hemorrhage.  Type of Study: Bedside swallow evaluation Previous Swallow Assessment: none noted Diet Prior to this Study: NPO Temperature Spikes Noted: No Respiratory Status: Room air History of Recent  Intubation: No Behavior/Cognition: Alert;Cooperative;Pleasant mood Oral Cavity - Dentition: Adequate natural dentition Self-Feeding Abilities: Able to feed self;Needs assist (witih SLP assist) Patient Positioning: Upright in bed Baseline Vocal Quality: Clear Volitional Cough: Weak Volitional Swallow: Able to elicit    Oral/Motor/Sensory Function Overall Oral Motor/Sensory Function: Appears within functional limits for tasks assessed (subtle lingual deviation to the left?)   Ice Chips Ice chips: Not tested   Thin Liquid Thin Liquid: Within functional limits Presentation: Cup;Self Fed;Straw    Nectar Thick Nectar Thick Liquid: Not tested   Honey Thick Honey Thick Liquid: Not tested   Puree Puree: Within functional limits Presentation: Spoon   Solid   GO   Darrell Molyneux MA, CCC-SLP [phone]  Solid: Within functional limits Presentation: Self Fed       [name] [name] 08/23/2013,10:23 AM

## 2013-08-23 NOTE — Progress Notes (Signed)
Chaplain responded to request for advance directive assistance. Pt does not speak good [place], speaks [place]. Pt's daughter speaks [place] and was present. She would like an an interpreter to review AD with patient. Chaplain consulted with charge nurse about calling for an interpreter in the morning. Chaplain will follow up and be present with interpreter to review AD materials with patient and pt's daughter.   Ethelene Browns [phone]

## 2013-08-24 ENCOUNTER — Inpatient Hospital Stay (HOSPITAL_COMMUNITY): Payer: Medicare Other

## 2013-08-24 DIAGNOSIS — I619 Nontraumatic intracerebral hemorrhage, unspecified: Secondary | ICD-10-CM

## 2013-08-24 DIAGNOSIS — I10 Essential (primary) hypertension: Secondary | ICD-10-CM

## 2013-08-24 DIAGNOSIS — I517 Cardiomegaly: Secondary | ICD-10-CM

## 2013-08-24 LAB — GLUCOSE, CAPILLARY
GLUCOSE-CAPILLARY: 119 mg/dL — AB (ref 70–99)
GLUCOSE-CAPILLARY: 127 mg/dL — AB (ref 70–99)
Glucose-Capillary: 107 mg/dL — ABNORMAL HIGH (ref 70–99)
Glucose-Capillary: 117 mg/dL — ABNORMAL HIGH (ref 70–99)

## 2013-08-24 MED ORDER — CLONIDINE HCL 0.2 MG PO TABS
0.2000 mg | ORAL_TABLET | Freq: Two times a day (BID) | ORAL | Status: DC
  Administered 2013-08-24 (×2): 0.2 mg via ORAL
  Filled 2013-08-24 (×4): qty 1

## 2013-08-24 MED ORDER — LISINOPRIL 10 MG PO TABS
10.0000 mg | ORAL_TABLET | Freq: Every day | ORAL | Status: DC
  Administered 2013-08-24: 10 mg via ORAL
  Filled 2013-08-24 (×2): qty 1

## 2013-08-24 NOTE — Evaluation (Signed)
Speech Language Pathology Evaluation Patient Details Name: [name] MRN: [id number] DOB: 29-Mar-1951 Today's Date: 08/24/2013 Time: 3825-0539 SLP Time Calculation (min): 15 min  Problem List:  Patient Active Problem List   Diagnosis Date Noted  . ICH (intracerebral hemorrhage) 08/22/2013  . Stroke 08/22/2013   Past Medical History:  Past Medical History  Diagnosis Date  . Hypertension   . Polio    Past Surgical History: History reviewed. No pertinent past surgical history. HPI:   63 y.o. male presenting with  Generalized weakness x 10 days. 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  MRI 08/23/13: 1. right basal ganglia hemorrhage, compatible with acute hemorrhagic infarct related to history of malignant hypertension. 2. Additional multi focal ischemic infarcts involving the bilateral parietal and temporal lobes as well as the right occipital lobe as above. 3. Remote infarcts involving the left basal ganglia/left corona radiata and right middle cerebellar peduncle. 4. Atrophy with advanced chronic microvascular ischemic disease.    Assessment / Plan / Recommendation Clinical Impression  Pt's primary language is [place]; his daughter, [name], was present for eval and assisted with translation.  Pt speaks and understands limited English - has lived in the [place] for two years.  Presents with decreased spontaneity of verbal output; naming errors; difficulty following multi-step commands; disorientation to elements of time and situation.  Daughter perceives that his behavior is different than baseline.  Recommend trial of SLP intervention to address initiation and communication in order for pt to express basic needs/ideas.    SLP Assessment  Patient needs continued Speech Language Pathology Services    Follow Up Recommendations  Inpatient Rehab    Frequency and Duration min 2x/week  1 week      SLP Goals  SLP Goals Potential to Achieve  Goals: Fair Potential Considerations:  (language) Progress/Goals/Alternative treatment plan discussed with pt/caregiver and they: Agree  SLP Evaluation Prior Functioning  Cognitive/Linguistic Baseline: Information not available  Lives With: Spouse;Son Available Help at Discharge: Family   Cognition  Overall Cognitive Status: Impaired/Different from baseline Arousal/Alertness: Awake/alert Orientation Level: Disoriented to time;Oriented to person;Oriented to place Attention: Focused;Sustained Focused Attention: Appears intact Sustained Attention: Appears intact Awareness: Impaired Awareness Impairment: Intellectual impairment Safety/Judgment: Impaired    Comprehension  Auditory Comprehension Commands: Impaired Two Step Basic Commands: 75-100% accurate Conversation: Simple    Expression Expression Primary Mode of Expression: Verbal Verbal Expression Overall Verbal Expression: Impaired Naming: Impairment Confrontation: Impaired Written Expression Written Expression: Not tested   Oral / Motor Oral Motor/Sensory Function Overall Oral Motor/Sensory Function: Appears within functional limits for tasks assessed Motor Speech Overall Motor Speech: Appears within functional limits for tasks assessed   [name], [place] CCC/SLP Pager [phone]      Assunta Curtis 08/24/2013, 2:23 PM

## 2013-08-24 NOTE — Progress Notes (Signed)
Pts. Result of MRI was relayed to Dr. Tressie Ellis and updated neuro status.

## 2013-08-24 NOTE — Progress Notes (Signed)
Chaplain met with patient and his daughter with interpreter to assist with advance directive. Chaplain confirmed with charge RN that interpreter is approved to interpret legal documents such as advance directives. With assistance from interpreter with translation and cultural consultation, chaplain assessed that pt appears confused and was hesitating with advance directive. Uncertain as to whether patient's wishes are to complete advance directive or if it is primarily wishes of family. Interpreter expressed concern that there may be complicated family and cultural dynamics. Chaplain available for follow up with family, if needed.  ° °Interpreter: Thai Chung, [employer] [facility] [place], [phone] ° °Hillary D Irusta, Chaplain °[phone] °

## 2013-08-24 NOTE — Progress Notes (Signed)
Stroke Team Progress Note  HISTORY [name] is a 63 y.o. male who had not been feeling well for about 10 days prior to admission. He described a feeling of generalized weakness. Per daughter he was able to walk with assistance of cain but over the few days prior to admission he had been so weak he could not walk. 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 Per daughter he takes his BP medications only when he feels ill but not when he is feeling good--same with ASA. He had not seen his primary MD since early 2014. Due to his generalized weakness he was brought to the ED. Once in the ED he was noted to have BP 240/108, 235/111, 264/140. While in the ED he was noted to be drowsy and HR dropped to 47. 0.5 mg Atropine was given and HR increased to 47. On consultation he was noted to have BP of 201/89 and nicardipine was started. He was awake and oriented and only complained of general weakness.   Date last known well: Date: 08/12/2013  Time last known well: Unable to determine  tPA Given: No: ICH   . He was admitted to the medical ICU for further evaluation and treatment. Overnight his blood pressure had been well controlled and he has remained neurologically stable. He also has a remote history of right thalamic intracerebral hemorrhage in 2012 from which he apparently made good recovery. 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 The patient still has right-sided weakness. He will need a full stroke workup. We will check an EKG secondary to abnormalities on telemetry.  OBJECTIVE Most recent Vital Signs: Filed Vitals:   08/24/13 0300 08/24/13 0343 08/24/13 0400 08/24/13 0500  BP: 138/66  155/61 167/68  Pulse: 60  57 61  Temp:  98.6 F (37 C)    TempSrc:  Oral    Resp: 21  23 17   Height:      Weight:      SpO2:  98%  98% 93%   CBG (last 3)   Recent Labs  08/23/13 1217 08/23/13 1605 08/23/13 2202  GLUCAP 102* 106* 132*    IV Fluid Intake:   . sodium chloride 75 mL/hr at 08/24/13 0500  . niCARDipine 5 mg/hr (08/24/13 0713)    MEDICATIONS  . hydrochlorothiazide  25 mg Oral Daily  . pantoprazole  40 mg Oral QHS  . senna-docusate  1 tablet Oral BID   PRN:  acetaminophen, acetaminophen  Diet:  Cardiac  with thin liquids. Activity:  Up with assistance DVT Prophylaxis:  SCDs CLINICALLY SIGNIFICANT STUDIES Basic Metabolic Panel:   Recent Labs Lab 08/22/13 1150  NA 141  K 3.9  CL 102  CO2 24  GLUCOSE 112*  BUN 23  CREATININE 0.53  CALCIUM 9.5   Liver Function Tests: No results found for this basename: AST, ALT, ALKPHOS, BILITOT, PROT, ALBUMIN,  in the last 168 hours CBC:   Recent Labs Lab 08/22/13 1150  WBC 5.5  HGB 15.7  HCT 45.6  MCV 89.4  PLT 158   Coagulation: No results found for this basename: LABPROT, INR,  in the last 168 hours Cardiac Enzymes: No results found for this basename: CKTOTAL, CKMB, CKMBINDEX, TROPONINI,  in the last 168 hours Urinalysis:   Recent Labs Lab 08/22/13 [place]  LABSPEC 1.014  PHURINE 7.0  GLUCOSEU NEGATIVE  HGBUR NEGATIVE  BILIRUBINUR NEGATIVE  KETONESUR NEGATIVE  PROTEINUR NEGATIVE  UROBILINOGEN 1.0  NITRITE NEGATIVE  LEUKOCYTESUR NEGATIVE   Lipid Panel    Component Value Date/Time   CHOL 157 08/23/2013 2225   HgbA1C  No results found for this basename: HGBA1C    Urine Drug Screen:   No results found for this basename: labopia,  cocainscrnur,  labbenz,  amphetmu,  thcu,  labbarb    Alcohol Level: No results found for this basename: ETH,  in the last 168 hours  Ct Head Wo Contrast 08/24/2013    1. No significant interval change in size and appearance of right basal ganglia intraparenchymal hemorrhage with associated vasogenic edema. No midline shift.  2. Grossly stable appearance of additional multi  focal ischemic infarcts, better evaluated on prior brain MRI.      Ct Head Wo Contrast 08/23/2013    Slight increase in the right basal ganglia hemorrhage as described above.  Atrophic changes.  No other acute abnormality is noted.     Ct Head Wo Contrast 08/22/2013    Acute Intraparenchymal hemorrhage right basal ganglial.    Mr Brain Wo Contrast 08/23/2013    1. Similar appearance of right basal ganglia hemorrhage, compatible with acute hemorrhagic infarct related to history of malignant hypertension.  2. Additional multi focal ischemic infarcts involving the bilateral parietal and temporal lobes as well as the right occipital lobe as above.  3. Remote infarcts involving the left basal ganglia/left corona radiata and right middle cerebellar peduncle.  4. Atrophy with advanced chronic microvascular ischemic disease.      2D Echocardiogram  ordered  Carotid Doppler  ordered  CXR  ordered  EKG Sinus bradycardia Probable left atrial enlargement LVH with secondary repolarization abnormality Anterior ST elevation, probably due to LVH   ECG - 08/24/2013 - heart rate 84 beats per minute otherwise no significant change from admission.  Therapy Recommendations pending  Physical Exam  Frail elderly [place] male not in distress.Awake alert. Afebrile. Head is nontraumatic. Neck is supple without bruit. Hearing is normal. Cardiac exam no murmur or gallop. Lungs are clear to auscultation. Distal pulses are well felt. Mental Status:  Alert, oriented, thought content appropriate. Speech fluent without evidence of aphasia. Able to follow simple commands without difficulty.  Cranial Nerves:  II: Discs flat bilaterally; Visual fields grossly normal, pupils equal, round, reactive to light and accommodation  III,IV, VI: ptosis not present, extra-ocular motions intact bilaterally  V,VII: smile symmetric, facial light touch sensation normal bilaterally  VIII: hearing normal bilaterally  IX,X:  gag reflex present  XI: bilateral shoulder shrug weak  XII: midline tongue extension  Motor:  Right grip moderately strong, unable to flex extend elbow and left right arm off the bed. He has polio which has left him with old right sided weakness. Left grip is strong, 4/5 left bicep flexion, no triceps extension or shoulder abduction. Unable to flex bilateral hips, flex and extend knees bilaterally. (pateint keeps stating he is too weak). HE is able to plantar flex bilateral ankles and shows no DF.  Sensory: Pinprick and light touch intact throughout, bilaterally  Deep Tendon Reflexes:  Right: Upper Extremity Left: Upper extremity  biceps (C-5 to C-6) 2/4 biceps (C-5 to C-6) 2/4  tricep (C7) 2/4 triceps (C7) 2/4  Brachioradialis (C6) 2/4 Brachioradialis (C6) 2/4  Lower Extremity Lower Extremity  quadriceps (L-2 to L-4) 1/4 quadriceps (L-2 to L-4) 1/4  Achilles (S1) 2/4  Achilles (S1) 1/4  Plantars:  Right: downgoing Left: downgoing  Cerebellar:  Unable to take part in .  Gait: not tested.   ASSESSMENT [name] is a 63 y.o. male presenting with  Generalized weakness x 10 days. 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  On no prior to admission. Now on no anticoagulants for secondary stroke prevention due to hemorrhagic stroke.  Patient with resultant  paraparesis and left UE weanesss. Stroke work up underway.   Malignant HT  CHOLESTEROL 168  - LDL 89  Childhood Polio  History of noncompliance with medications.   Hospital day # 2  TREATMENT/PLAN  Taper and discontinue Cardene. Systolic blood pressure goal <180.   Resume home medications. Resume Catapres and Lotensin and HCTZ. Hold NSAID for now.  Transfer out of the unit when blood pressure is controlled on PO medications  Await carotid Dopplers and 2-D echo  Await therapy evaluations.  No antithrombotic secondary to hemorrhagic stroke.  Risk factor modification - encourage  compliance with medications.  Mikey Bussing PA-C [facility] Pager [phone] 08/24/2013, 11:34 AM This patient is critically ill and at significant risk of neurological worsening, death and care requires constant monitoring of vital signs, hemodynamics,respiratory and cardiac monitoring,review of multiple databases, neurological assessment, discussion with family, other specialists and medical decision making of high complexity. I spent 30 minutes of neurocritical care time  in the care of  this patient. I have examined this patient, reviewed pertinent data, the plan of care and agree with the above  Antony Contras, MD       To contact Stroke Continuity provider, please refer to [email or web address] After hours, contact General Neurology

## 2013-08-24 NOTE — Progress Notes (Signed)
VASCULAR LAB PRELIMINARY  PRELIMINARY  PRELIMINARY  PRELIMINARY  Carotid duplex  completed.    Preliminary report:  Bilateral:  1-39% ICA stenosis.  Vertebral artery flow is antegrade.      Nani Ravens, RVT 08/24/2013, 10:31 AM

## 2013-08-24 NOTE — Progress Notes (Signed)
  Echocardiogram 2D Echocardiogram has been performed.  Libby 08/24/2013, 12:17 PM

## 2013-08-25 ENCOUNTER — Encounter (HOSPITAL_COMMUNITY): Payer: Self-pay | Admitting: *Deleted

## 2013-08-25 ENCOUNTER — Inpatient Hospital Stay (HOSPITAL_COMMUNITY): Payer: Medicare Other

## 2013-08-25 DIAGNOSIS — I10 Essential (primary) hypertension: Secondary | ICD-10-CM

## 2013-08-25 LAB — GLUCOSE, CAPILLARY
GLUCOSE-CAPILLARY: 120 mg/dL — AB (ref 70–99)
Glucose-Capillary: 115 mg/dL — ABNORMAL HIGH (ref 70–99)
Glucose-Capillary: 123 mg/dL — ABNORMAL HIGH (ref 70–99)
Glucose-Capillary: 165 mg/dL — ABNORMAL HIGH (ref 70–99)
Glucose-Capillary: 72 mg/dL (ref 70–99)

## 2013-08-25 MED ORDER — ATROPINE SULFATE 0.1 MG/ML IJ SOLN
INTRAMUSCULAR | Status: AC
  Filled 2013-08-25: qty 10

## 2013-08-25 MED ORDER — LISINOPRIL 20 MG PO TABS
20.0000 mg | ORAL_TABLET | Freq: Every day | ORAL | Status: DC
  Administered 2013-08-25 – 2013-08-28 (×4): 20 mg via ORAL
  Filled 2013-08-25 (×4): qty 1

## 2013-08-25 NOTE — Progress Notes (Signed)
Back from stat CT scan .  Pt. Continue to be sleepy hard to arouse sometimes.

## 2013-08-25 NOTE — Progress Notes (Signed)
Pt. Has change in neuro status sleepy and at times hard to arouse, open eyes to name calling grips noticed to be weaker. HR is slower than before in  low 40's, Dr. Tressie Ellis made aware of pts. neuro status with orders made.

## 2013-08-25 NOTE — Progress Notes (Signed)
Pt. Brought down for stat CT of the head without contrast as ordered.

## 2013-08-25 NOTE — Progress Notes (Signed)
Pts. HR sustained in the low 40's bradycardic, breathing at times shallow, neuro same lethargic and at times hard to arouse, Dr. Tressie Ellis made aware of pts. Vital signs and neuro status and the result of CT scan which no changes.  Will continue to monitor.

## 2013-08-25 NOTE — Progress Notes (Signed)
Stroke Team Progress Note  HISTORY [name] is a 63 y.o. male who had not been feeling well for about 10 days prior to admission. He described a feeling of generalized weakness. Per daughter he was able to walk with assistance of cain but over the few days prior to admission he had been so weak he could not walk. 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 Per daughter he takes his BP medications only when he feels ill but not when he is feeling good--same with ASA. He had not seen his primary MD since early 2014. Due to his generalized weakness he was brought to the ED. Once in the ED he was noted to have BP 240/108, 235/111, 264/140. While in the ED he was noted to be drowsy and HR dropped to 47. 0.5 mg Atropine was given and HR increased to 47. On consultation he was noted to have BP of 201/89 and nicardipine was started. He was awake and oriented and only complained of general weakness.   Date last known well: Date: 08/12/2013  Time last known well: Unable to determine  tPA Given: No: ICH   . He was admitted to the medical ICU for further evaluation and treatment. Overnight his blood pressure had been well controlled and he has remained neurologically stable. He also has a remote history of right thalamic intracerebral hemorrhage in 2012 from which he apparently made good recovery. 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 The patient still has right-sided weakness. He will need a full stroke workup.  OBJECTIVE Most recent Vital Signs: Filed Vitals:   08/25/13 0500 08/25/13 0600 08/25/13 0700 08/25/13 0740  BP:  131/56 129/68   Pulse:  45 36   Temp:    98.1 F (36.7 C)  TempSrc:    Oral  Resp:  23 22   Height:      Weight: 123 lb 7.3 oz (56 kg)     SpO2:  100% 100%    CBG (last 3)   Recent  Labs  08/24/13 2139 08/25/13 0114 08/25/13 0738  GLUCAP 127* 120* 115*    IV Fluid Intake:   . sodium chloride 75 mL/hr at 08/25/13 0728  . niCARDipine 0.5 mg/hr (08/24/13 2100)    MEDICATIONS  . atropine      . cloNIDine  0.2 mg Oral BID  . hydrochlorothiazide  25 mg Oral Daily  . lisinopril  10 mg Oral Daily  . pantoprazole  40 mg Oral QHS  . senna-docusate  1 tablet Oral BID   PRN:  acetaminophen, acetaminophen  Diet:  Cardiac  with thin liquids. Activity:  Up with assistance DVT Prophylaxis:  SCDs CLINICALLY SIGNIFICANT STUDIES Basic Metabolic Panel:   Recent Labs Lab 08/22/13 1150  NA 141  K 3.9  CL 102  CO2 24  GLUCOSE 112*  BUN 23  CREATININE 0.53  CALCIUM 9.5   Liver Function Tests: No results found for this basename: AST, ALT, ALKPHOS, BILITOT, PROT, ALBUMIN,  in the last 168 hours CBC:   Recent Labs Lab 08/22/13 1150  WBC 5.5  HGB 15.7  HCT 45.6  MCV 89.4  PLT 158   Coagulation: No results found for this basename: LABPROT, INR,  in the last 168 hours Cardiac Enzymes: No results found for this basename: CKTOTAL,  CKMB, CKMBINDEX, TROPONINI,  in the last 168 hours Urinalysis:   Recent Labs Lab 08/22/13 1550  COLORURINE YELLOW  LABSPEC 1.014  PHURINE 7.0  GLUCOSEU NEGATIVE  HGBUR NEGATIVE  BILIRUBINUR NEGATIVE  KETONESUR NEGATIVE  PROTEINUR NEGATIVE  UROBILINOGEN 1.0  NITRITE NEGATIVE  LEUKOCYTESUR NEGATIVE   Lipid Panel    Component Value Date/Time   CHOL 157 08/23/2013 2225   HgbA1C  No results found for this basename: HGBA1C    Urine Drug Screen:   No results found for this basename: labopia,  cocainscrnur,  labbenz,  amphetmu,  thcu,  labbarb    Alcohol Level: No results found for this basename: ETH,  in the last 168 hours  Ct Head Wo Contrast 08/24/2013    1. No significant interval change in size and appearance of right basal ganglia intraparenchymal hemorrhage with associated vasogenic edema. No midline shift.  2.  Grossly stable appearance of additional multi focal ischemic infarcts, better evaluated on prior brain MRI.      Ct Head Wo Contrast 08/23/2013    Slight increase in the right basal ganglia hemorrhage as described above.  Atrophic changes.  No other acute abnormality is noted.     Ct Head Wo Contrast 08/22/2013    Acute Intraparenchymal hemorrhage right basal ganglial.    Mr Brain Wo Contrast 08/23/2013    1. Similar appearance of right basal ganglia hemorrhage, compatible with acute hemorrhagic infarct related to history of malignant hypertension.  2. Additional multi focal ischemic infarcts involving the bilateral parietal and temporal lobes as well as the right occipital lobe as above.  3. Remote infarcts involving the left basal ganglia/left corona radiata and right middle cerebellar peduncle.  4. Atrophy with advanced chronic microvascular ischemic disease.      2D Echocardiogram   Study Conclusions  - Left ventricle: The cavity size was normal. There was moderate concentric hypertrophy. Systolic function was vigorous. The estimated ejection fraction was 75%, in the range of 65% to 70%. Wall motion was normal; there were no regional wall motion abnormalities. Doppler parameters are consistent with abnormal left ventricular relaxation (grade 1 diastolic dysfunction). - Aortic valve: Trileaflet; normal thickness leaflets. No regurgitation. - Mitral valve: No regurgitation. - Right ventricle: Systolic function was normal. - Right atrium: The atrium was normal in size. - Tricuspid valve: No regurgitation. - Pulmonary arteries: Systolic pressure was within the normal range. - Pericardium, extracardiac: There was no pericardial effusion.   Carotid Doppler   Carotid duplex completed.  Preliminary report: Bilateral: 1-39% ICA stenosis. Vertebral artery flow is antegrade.   CXR  ordered  EKG Sinus bradycardia Probable left atrial enlargement LVH with secondary  repolarization abnormality Anterior ST elevation, probably due to LVH   ECG - 08/24/2013 - heart rate 84 beats per minute otherwise no significant change from admission.  Therapy Recommendations CIR/24 hr assistance   Physical Exam  General: The patient is sleepy, but he will alert at the time of the exam.  Respiratory: clear lung fields  Cardiovascular: regular rate, 55  Abdomen: Soft, nontender  Skin: No significant peripheral edema is noted.   Neurologic Exam  Mental status: The patient is minimally verbal.  Cranial nerves: Facial symmetry is present. Speech is dysarthric, difficult to understand. Extraocular movements are full. Visual fields are full to threat.  Motor: The patient has good effort with the left arm, right arm is flaccid. Legs are externally rotated, minimal voluntary movement is seen with either side.  Sensory examination: Difficult to determine, minimal  response to pain stimulation on all 4's.  Coordination: The patient will not cooperate for cerebellar testing.  Gait and station: The gait could not be tested.  Reflexes: Deep tendon reflexes are symmetric, depressed, toes are down bilaterally.   ASSESSMENT [name] is a 63 y.o. male presenting with  Generalized weakness x 10 days. 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  On no prior to admission. Now on no anticoagulants for secondary stroke prevention due to hemorrhagic stroke.  Patient with resultant  paraparesis and left UE weanesss. Stroke work up underway.   Malignant HT  CHOLESTEROL 168  - LDL 89  Childhood Polio  History of noncompliance with medications.   Hospital day # 3  The patient has been noted to have bradycardia over night, and the CT head was done, shows not change in ICH. Patient is on catapres, ? Cause of the bradycardia.  TREATMENT/PLAN  Taper and discontinue Cardene. Systolic blood pressure goal <180.   Resume home  medications.  Hold NSAID for now.  Transfer out of the unit when blood pressure is controlled on PO medications  Discontinue the catapress due to bradycardia, follow heart rates, Cardiology to see if bradycardia does not resolve.  PT and ST following  No antithrombotic secondary to hemorrhagic stroke.  Risk factor modification - encourage compliance with medications.   08/25/2013, 8:48 AM Kathrynn Ducking [phone]     To contact Stroke Continuity provider, please refer to [email or web address] After hours, contact General Neurology

## 2013-08-26 ENCOUNTER — Inpatient Hospital Stay (HOSPITAL_COMMUNITY): Payer: Medicare Other

## 2013-08-26 LAB — CBC WITH DIFFERENTIAL/PLATELET
BASOS ABS: 0 10*3/uL (ref 0.0–0.1)
Basophils Relative: 0 % (ref 0–1)
Eosinophils Absolute: 0.2 10*3/uL (ref 0.0–0.7)
Eosinophils Relative: 2 % (ref 0–5)
HEMATOCRIT: 39.3 % (ref 39.0–52.0)
HEMOGLOBIN: 13.4 g/dL (ref 13.0–17.0)
LYMPHS ABS: 1.4 10*3/uL (ref 0.7–4.0)
LYMPHS PCT: 15 % (ref 12–46)
MCH: 30.5 pg (ref 26.0–34.0)
MCHC: 34.1 g/dL (ref 30.0–36.0)
MCV: 89.5 fL (ref 78.0–100.0)
MONO ABS: 0.8 10*3/uL (ref 0.1–1.0)
Monocytes Relative: 9 % (ref 3–12)
NEUTROS ABS: 7.3 10*3/uL (ref 1.7–7.7)
Neutrophils Relative %: 74 % (ref 43–77)
Platelets: 141 10*3/uL — ABNORMAL LOW (ref 150–400)
RBC: 4.39 MIL/uL (ref 4.22–5.81)
RDW: 13.4 % (ref 11.5–15.5)
WBC: 9.8 10*3/uL (ref 4.0–10.5)

## 2013-08-26 LAB — COMPREHENSIVE METABOLIC PANEL
ALBUMIN: 3.1 g/dL — AB (ref 3.5–5.2)
ALK PHOS: 42 U/L (ref 39–117)
ALT: 21 U/L (ref 0–53)
AST: 23 U/L (ref 0–37)
BUN: 14 mg/dL (ref 6–23)
CALCIUM: 8.9 mg/dL (ref 8.4–10.5)
CO2: 22 mEq/L (ref 19–32)
CREATININE: 0.69 mg/dL (ref 0.50–1.35)
Chloride: 104 mEq/L (ref 96–112)
GFR calc non Af Amer: >90 mL/min (ref >90)
GLUCOSE: 98 mg/dL (ref 70–99)
Potassium: 3.6 mEq/L — ABNORMAL LOW (ref 3.7–5.3)
Sodium: 141 mEq/L (ref 137–147)
Total Bilirubin: 1.4 mg/dL — ABNORMAL HIGH (ref 0.3–1.2)
Total Protein: 6.5 g/dL (ref 6.0–8.3)

## 2013-08-26 LAB — GLUCOSE, CAPILLARY
GLUCOSE-CAPILLARY: 144 mg/dL — AB (ref 70–99)
GLUCOSE-CAPILLARY: 90 mg/dL (ref 70–99)
Glucose-Capillary: 98 mg/dL (ref 70–99)
Glucose-Capillary: 85 mg/dL (ref 70–99)

## 2013-08-26 MED ORDER — ENSURE COMPLETE PO LIQD
237.0000 mL | Freq: Two times a day (BID) | ORAL | Status: DC
  Administered 2013-08-27 – 2013-08-28 (×3): 237 mL via ORAL

## 2013-08-26 MED ORDER — AMLODIPINE BESYLATE 5 MG PO TABS
5.0000 mg | ORAL_TABLET | Freq: Every day | ORAL | Status: DC
  Administered 2013-08-26 – 2013-08-28 (×3): 5 mg via ORAL
  Filled 2013-08-26 (×3): qty 1

## 2013-08-26 MED ORDER — POTASSIUM CHLORIDE CRYS ER 10 MEQ PO TBCR
10.0000 meq | EXTENDED_RELEASE_TABLET | Freq: Two times a day (BID) | ORAL | Status: DC
  Administered 2013-08-26 – 2013-08-28 (×4): 10 meq via ORAL
  Filled 2013-08-26 (×6): qty 1

## 2013-08-26 NOTE — Progress Notes (Addendum)
Pt admitted in from 73M into 3S13. Pt alert and oriented, speaks very little english but is able to communicate needs. Pt oriented to room and all vital signs WDL. Will continue to monitor.

## 2013-08-26 NOTE — Progress Notes (Signed)
Stroke Team Progress Note  HISTORY [name] is a 63 y.o. male who had not been feeling well for about 10 days prior to admission. He described a feeling of generalized weakness. Per daughter he was able to walk with assistance of cain but over the few days prior to admission he had been so weak he could not walk. 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 Per daughter he takes his BP medications only when he feels ill but not when he is feeling good--same with ASA. He had not seen his primary MD since early 2014. Due to his generalized weakness he was brought to the ED. Once in the ED he was noted to have BP 240/108, 235/111, 264/140. While in the ED he was noted to be drowsy and HR dropped to 47. 0.5 mg Atropine was given and HR increased to 47. On consultation he was noted to have BP of 201/89 and nicardipine was started. He was awake and oriented and only complained of general weakness.   Date last known well: Date: 08/12/2013  Time last known well: Unable to determine  tPA Given: No: ICH   . He was admitted to the medical ICU for further evaluation and treatment. Overnight his blood pressure had been well controlled and he has remained neurologically stable. He also has a remote history of right thalamic intracerebral hemorrhage in 2012 from which he apparently made good recovery. 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 The patient still has right-sided weakness. More alert today. C/O left ankle pain with movement.  OBJECTIVE Most recent Vital Signs: Filed Vitals:   08/26/13 0400 08/26/13 0500 08/26/13 0600 08/26/13 0700  BP: 166/71 159/74 153/68 174/75  Pulse: 69 74 67 73  Temp: 98 F (36.7 C)   98.9 F (37.2 C)  TempSrc: Oral   Oral  Resp: 23 19 22 29   Height:      Weight:      SpO2: 97%  96% 97% 98%   CBG (last 3)   Recent Labs  08/25/13 2216 08/26/13 0442 08/26/13 0735  GLUCAP 72 98 85    IV Fluid Intake:   . sodium chloride 75 mL/hr at 08/25/13 2105  . niCARDipine 5 mg/hr (08/26/13 0523)    MEDICATIONS  . hydrochlorothiazide  25 mg Oral Daily  . lisinopril  20 mg Oral Daily  . pantoprazole  40 mg Oral QHS  . senna-docusate  1 tablet Oral BID   PRN:  acetaminophen, acetaminophen  Diet:  Cardiac  with thin liquids. Activity:  Up with assistance DVT Prophylaxis:  SCDs CLINICALLY SIGNIFICANT STUDIES Basic Metabolic Panel:   Recent Labs Lab 08/22/13 1150 08/26/13 0246  NA 141 141  K 3.9 3.6*  CL 102 104  CO2 24 22  GLUCOSE 112* 98  BUN 23 14  CREATININE 0.53 0.69  CALCIUM 9.5 8.9   Liver Function Tests:   Recent Labs Lab 08/26/13 0246  AST 23  ALT 21  ALKPHOS 42  BILITOT 1.4*  PROT 6.5  ALBUMIN 3.1*   CBC:   Recent Labs Lab 08/22/13 1150 08/26/13 0246  WBC 5.5 9.8  NEUTROABS  --  7.3  HGB 15.7 13.4  HCT 45.6 39.3  MCV 89.4 89.5  PLT 158 141*   Coagulation: No results found for this basename:  LABPROT, INR,  in the last 168 hours Cardiac Enzymes: No results found for this basename: CKTOTAL, CKMB, CKMBINDEX, TROPONINI,  in the last 168 hours Urinalysis:   Recent Labs Lab 08/22/13 1550  COLORURINE YELLOW  LABSPEC 1.014  PHURINE 7.0  Rafael Gonzalez 1.0  NITRITE NEGATIVE  LEUKOCYTESUR NEGATIVE   Lipid Panel    Component Value Date/Time   CHOL 157 08/23/2013 2225   HgbA1C  No results found for this basename: HGBA1C    Urine Drug Screen:   No results found for this basename: labopia,  cocainscrnur,  labbenz,  amphetmu,  thcu,  labbarb    Alcohol Level: No results found for this basename: ETH,  in the last 168 hours  Ct Head Wo Contrast 08/24/2013    1. No significant interval change in size and appearance of right  basal ganglia intraparenchymal hemorrhage with associated vasogenic edema. No midline shift.  2. Grossly stable appearance of additional multi focal ischemic infarcts, better evaluated on prior brain MRI.      Ct Head Wo Contrast 08/23/2013    Slight increase in the right basal ganglia hemorrhage as described above.  Atrophic changes.  No other acute abnormality is noted.     Ct Head Wo Contrast 08/22/2013    Acute Intraparenchymal hemorrhage right basal ganglial.    Mr Brain Wo Contrast 08/23/2013    1. Similar appearance of right basal ganglia hemorrhage, compatible with acute hemorrhagic infarct related to history of malignant hypertension.  2. Additional multi focal ischemic infarcts involving the bilateral parietal and temporal lobes as well as the right occipital lobe as above.  3. Remote infarcts involving the left basal ganglia/left corona radiata and right middle cerebellar peduncle.  4. Atrophy with advanced chronic microvascular ischemic disease.      2D Echocardiogram   Study Conclusions  - Left ventricle: The cavity size was normal. There was moderate concentric hypertrophy. Systolic function was vigorous. The estimated ejection fraction was 75%, in the range of 65% to 70%. Wall motion was normal; there were no regional wall motion abnormalities. Doppler parameters are consistent with abnormal left ventricular relaxation (grade 1 diastolic dysfunction). - Aortic valve: Trileaflet; normal thickness leaflets. No regurgitation. - Mitral valve: No regurgitation. - Right ventricle: Systolic function was normal. - Right atrium: The atrium was normal in size. - Tricuspid valve: No regurgitation. - Pulmonary arteries: Systolic pressure was within the normal range. - Pericardium, extracardiac: There was no pericardial effusion.   Carotid Doppler   Carotid duplex completed.  Preliminary report: Bilateral: 1-39% ICA stenosis. Vertebral artery flow is antegrade.   CXR   ordered  EKG Sinus bradycardia Probable left atrial enlargement LVH with secondary repolarization abnormality Anterior ST elevation, probably due to LVH   ECG - 08/24/2013 - heart rate 84 beats per minute otherwise no significant change from admission.  Therapy Recommendations CIR/24 hr assistance   Physical Exam  General: The patient is alert, cooperative.  Respiratory: clear lung fields  Cardiovascular: regular rate, 75  Abdomen: Soft, nontender  Skin: No significant peripheral edema is noted.   Neurologic Exam  Mental status: The patient is alert and fully cooperative.  Cranial nerves: Facial symmetry is present. Speech is dysarthric, difficult to understand. Extraocular movements are full. Visual fields are full.  Motor: The patient has good effort with the left arm, left deltoid weakness is seen. right arm is almost flaccid, some flexion at the elbow  is noted. Legs are externally rotated, minimal voluntary movement is seen with either side. Left leg is in flexion. The left ankle is painful to move, slightly warm.  Sensory examination: Symmetric to soft touch.  Coordination: The patient cannot perform heal to shin on either side, cannot perform finger nose finger with the right arm and has difficulty with the left arm due to deltoid weakness.  Gait and station: The gait could not be tested.  Reflexes: Deep tendon reflexes are symmetric, depressed, toes are down bilaterally.   ASSESSMENT Mr. Vihaan Gloss is a 63 y.o. male presenting with  Generalized weakness x 10 days. 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  On no prior to admission. Now on no anticoagulants for secondary stroke prevention due to hemorrhagic stroke.  Patient with resultant  paraparesis and left UE weanesss. Stroke work up underway.   Malignant HT  CHOLESTEROL 168  - LDL 89  Childhood Polio  History of noncompliance with medications.   Hospital day #  4  The patient has been noted to have bradycardia that has improved with cessation of the clonidine. Drowsiness has also improved. The patient is not eating well.  TREATMENT/PLAN  Taper and discontinue Cardene. Systolic blood pressure goal <180. Will add norvasc.  Resume home medications.  Hold NSAID for now.  Transfer out of the unit when blood pressure is controlled on PO medications will transfer today  X-ray left ankle due to pain ? Could be gout  PT and ST following  No antithrombotic secondary to hemorrhagic stroke.  Risk factor modification - encourage compliance with medications.  OOB into chair   08/26/2013, 7:59 AM Kathrynn Ducking [phone]     To contact Stroke Continuity provider, please refer to [email or web address] After hours, contact General Neurology

## 2013-08-27 ENCOUNTER — Encounter (HOSPITAL_COMMUNITY): Payer: Self-pay | Admitting: *Deleted

## 2013-08-27 ENCOUNTER — Inpatient Hospital Stay (HOSPITAL_COMMUNITY): Payer: Medicare Other

## 2013-08-27 MED ORDER — ENOXAPARIN SODIUM 40 MG/0.4ML ~~LOC~~ SOLN
40.0000 mg | SUBCUTANEOUS | Status: DC
  Administered 2013-08-27: 40 mg via SUBCUTANEOUS
  Filled 2013-08-27 (×2): qty 0.4

## 2013-08-27 NOTE — Progress Notes (Signed)
Stroke Team Progress Note  HISTORY [name] is a 63 y.o. male who had not been feeling well for about 10 days prior to admission. He described a feeling of generalized weakness. Per daughter he was able to walk with assistance of cain but over the few days prior to admission he had been so weak he could not walk. 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 Per daughter he takes his BP medications only when he feels ill but not when he is feeling good--same with ASA. He had not seen his primary MD since early 2014. Due to his generalized weakness he was brought to the ED. Once in the ED he was noted to have BP 240/108, 235/111, 264/140. While in the ED he was noted to be drowsy and HR dropped to 47. 0.5 mg Atropine was given and HR increased to 47. On consultation he was noted to have BP of 201/89 and nicardipine was started. He was awake and oriented and only complained of general weakness.   Date last known well: Date: 08/12/2013  Time last known well: Unable to determine  tPA Given: No: ICH   . He was admitted to the medical ICU for further evaluation and treatment. Overnight his blood pressure had been well controlled and he has remained neurologically stable. He also has a remote history of right thalamic intracerebral hemorrhage in 2012 from which he apparently made good recovery. 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 Off cardene drip and Bp better, left ankle pain present  OBJECTIVE Most recent Vital Signs: Filed Vitals:   08/27/13 0400 08/27/13 0500 08/27/13 0600 08/27/13 0735  BP: 148/74 145/81 165/77 141/82  Pulse: 63 52 52 62  Temp:    98.5 F (36.9 C)  TempSrc:    Oral  Resp: 15 24 18 19   Height:      Weight:      SpO2: 95% 96% 100% 97%   CBG (last 3)   Recent Labs  08/26/13 0735 08/26/13 1123  08/26/13 1536  GLUCAP 85 144* 90    IV Fluid Intake:   . sodium chloride 75 mL/hr at 08/25/13 2105    MEDICATIONS  . amLODipine  5 mg Oral Daily  . feeding supplement (ENSURE COMPLETE)  237 mL Oral BID BM  . hydrochlorothiazide  25 mg Oral Daily  . lisinopril  20 mg Oral Daily  . pantoprazole  40 mg Oral QHS  . potassium chloride  10 mEq Oral BID  . senna-docusate  1 tablet Oral BID   PRN:  acetaminophen, acetaminophen  Diet:  Cardiac  with thin liquids. Activity:  Up with assistance DVT Prophylaxis:  SCDs  CLINICALLY SIGNIFICANT STUDIES Basic Metabolic Panel:   Recent Labs Lab 08/22/13 1150 08/26/13 0246  NA 141 141  K 3.9 3.6*  CL 102 104  CO2 24 22  GLUCOSE 112* 98  BUN 23 14  CREATININE 0.53 0.69  CALCIUM 9.5 8.9   Liver Function Tests:   Recent Labs Lab 08/26/13 0246  AST 23  ALT 21  ALKPHOS 42  BILITOT 1.4*  PROT 6.5  ALBUMIN 3.1*   CBC:   Recent Labs Lab 08/22/13 1150 08/26/13 0246  WBC 5.5 9.8  NEUTROABS  --  7.3  HGB 15.7 13.4  HCT 45.6 39.3  MCV 89.4 89.5  PLT 158 141*   Coagulation: No results found for this basename: LABPROT, INR,  in the last 168 hours Cardiac Enzymes: No results found for this basename: CKTOTAL, CKMB, CKMBINDEX, TROPONINI,  in the last 168 hours Urinalysis:   Recent Labs Lab 08/22/13 1550  COLORURINE YELLOW  LABSPEC 1.014  PHURINE 7.0  [place] 1.0  NITRITE NEGATIVE  LEUKOCYTESUR NEGATIVE   Lipid Panel    Component Value Date/Time   CHOL 157 08/23/2013 2225   HgbA1C  No results found for this basename: HGBA1C    Urine Drug Screen:   No results found for this basename: labopia,  cocainscrnur,  labbenz,  amphetmu,  thcu,  labbarb    Alcohol Level: No results found for this basename: ETH,  in the last 168 hours  Ct Head Wo Contrast 08/24/2013    1. No significant interval change in size and  appearance of right basal ganglia intraparenchymal hemorrhage with associated vasogenic edema. No midline shift.  2. Grossly stable appearance of additional multi focal ischemic infarcts, better evaluated on prior brain MRI.     Ct Head Wo Contrast 08/23/2013    Slight increase in the right basal ganglia hemorrhage as described above.  Atrophic changes.  No other acute abnormality is noted.     Ct Head Wo Contrast 08/22/2013    Acute Intraparenchymal hemorrhage right basal ganglial.   Mr Brain Wo Contrast 08/23/2013    1. Similar appearance of right basal ganglia hemorrhage, compatible with acute hemorrhagic infarct related to history of malignant hypertension.  2. Additional multi focal ischemic infarcts involving the bilateral parietal and temporal lobes as well as the right occipital lobe as above.  3. Remote infarcts involving the left basal ganglia/left corona radiata and right middle cerebellar peduncle.  4. Atrophy with advanced chronic microvascular ischemic disease.     2D Echocardiogram   - Left ventricle: The cavity size was normal. There was moderate concentric hypertrophy. Systolic function was vigorous. The estimated ejection fraction was 75%, in the range of 65% to 70%. Wall motion was normal; there were no regional wall motion abnormalities. Doppler parameters are consistent with abnormal left ventricular relaxation (grade 1 diastolic dysfunction). - Aortic valve: Trileaflet; normal thickness leaflets. No regurgitation. - Mitral valve: No regurgitation. - Right ventricle: Systolic function was normal. - Right atrium: The atrium was normal in size. - Tricuspid valve: No regurgitation. - Pulmonary arteries: Systolic pressure was within the normal range. - Pericardium, extracardiac: There was no pericardial effusion.  Carotid Doppler   Carotid duplex completed.  Preliminary report: Bilateral: 1-39% ICA stenosis. Vertebral artery flow is antegrade.   CXR   pending Left ankle xray 08/26/13 ; Question old trauma to the talus with remodeling. 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 No acute fracture. Mortise  appears grossly intact. No joint effusion. Bones osteoporotic.   EKG Sinus bradycardia Probable left atrial enlargement LVH with secondary repolarization abnormality Anterior ST elevation, probably due to LVH   ECG - 08/24/2013 - heart rate 84 beats per minute otherwise no significant change from admission.  Therapy Recommendations CIR/24 hr assistance  Physical Exam General: The patient is alert, cooperative. Respiratory: clear lung fields Cardiovascular: regular rate, 75 Abdomen: Soft, nontender Skin: No significant peripheral edema is noted. Left ankle movements painful. Bilateral ankle and wrist deformities from polio  Neurologic Exam Mental status: The patient is alert and fully cooperative. Cranial nerves: Facial symmetry  is present. Speech is dysarthric, difficult to understand. Extraocular movements are full. Visual fields are full. Motor: The patient has good effort with the left arm, left deltoid weakness is seen. right arm is almost flaccid, some flexion at the elbow is noted. Legs are externally rotated, minimal voluntary movement is seen with either side. Left leg is in flexion. The left ankle is painful to move, slightly warm. Sensory examination: Symmetric to soft touch. Coordination: The patient cannot perform heal to shin on either side, cannot perform finger nose finger with the right arm and has difficulty with the left arm due to deltoid weakness. Gait and station: The gait could not be tested. Reflexes: Deep tendon reflexes are symmetric, depressed, toes are down bilaterally.   ASSESSMENT Mr. [name] is a 63 y.o. male presenting with  Generalized weakness x 10 days. 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  On no prior to admission. Now on no anticoagulants for  secondary stroke prevention due to hemorrhagic stroke.  Patient with resultant  paraparesis and left UE weanesss. Stroke work up underway.   Malignant HT  CHOLESTEROL 168  - LDL 89  Childhood Polio  History of noncompliance with medications.   Hospital day # 5  The patient has been noted to have bradycardia that has improved with cessation of the clonidine. Drowsiness has also improved. The patient is not eating well.  TREATMENT/PLAN  Taper and discontinue Cardene. Systolic blood pressure goal <180. Will add norvasc.  Resume home medications.  Hold NSAID for now.  Transfer out of the unit when blood pressure is controlled on PO medications will transfer today  X-ray left ankle no fracturet  PT and ST following  No antithrombotic secondary to hemorrhagic stroke.  Risk factor modification - encourage compliance with medications.  OOB into chair   start tramadol for pain  Transfer to floor bed  Burnetta Sabin, MSN, RN, ANVP-BC, ANP-BC, [facility] Pager: [phone] 08/27/2013 8:48 AM  I have personally obtained a history, examined the patient, evaluated imaging results, and formulated the assessment and plan of care. I agree with the above.  Antony Contras, MD   To contact Stroke Continuity provider, please refer to [email or web address] After hours, contact General Neurology

## 2013-08-27 NOTE — Progress Notes (Signed)
Occupational Therapy Evaluation Patient Details Name: [name] MRN: [id number] DOB: [birth year] Today's Date: 08/27/2013    History of Present Illness  Pt with right basal ganglia hemorrhage, additional multi focal ischemic infarcts involving the bilateral parietal and temporal lobes as well as the right occipital lobe.  Remote infarcts involving the left basal ganglia/left corona radiata and right middle cerebellar peduncle. 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     Clinical Impression   PTA, per pt and daughter, pt mod I with ADL and  mobility @ cane level. Pt swam and fished. Lived at home with wife and 43 yo son. Per family, R side was weaker from polio. Pt with significant functional decline, requiring total A with ADL and +2 assist with mobility. Supportive family, but unsure if family can provide 24/7 assistance after D/C. Feel pt is excellent CIR candidate if family can provide support after D/C. Pt will benefit from skilled OT services to facilitate D/C to next venue due to below deficits.    Follow Up Recommendations  CIR;Supervision/Assistance - 24 hour    Equipment Recommendations  3 in 1 bedside comode;Tub/shower bench;Wheelchair (measurements OT);Wheelchair cushion (measurements OT);Hospital bed    Recommendations for Other Services Rehab consult     Precautions / Restrictions Precautions Precautions: Fall      Mobility Bed Mobility Overal bed mobility: +2 for physical assistance;Needs Assistance                Transfers Overall transfer level:  (currently need to use lift system - maximove)                    Balance Overall balance assessment: Needs assistance Sitting-balance support: Bilateral upper extremity supported Sitting balance-Leahy Scale: Poor Sitting balance - Comments: Unable to maintain postural control upright in bed without assistance. Using BUE to help position at midline                                     ADL Overall ADL's : Needs assistance/impaired Eating/Feeding: Total assistance   Grooming: Total assistance;Bed level   Upper Body Bathing: Total assistance;Bed level   Lower Body Bathing: Total assistance;Bed level   Upper Body Dressing : Total assistance;Bed level   Lower Body Dressing: Total assistance;Bed level               Functional mobility during ADLs:  (Needs +2) General ADL Comments: total assist at this time. Pt became tearful and said " i use to be able to do this for myself".      Vision                 Additional Comments: poor saccades and tracking. ? L field cut   Perception Perception Perception Tested?: No   Praxis Praxis Praxis tested?: Deficits Deficits: Perseveration;Limb apraxia;Ideomotor    Pertinent Vitals/Pain C/o pain L foot - nsg aware     Hand Dominance Right   Extremity/Trunk Assessment Upper Extremity Assessment Upper Extremity Assessment: RUE deficits/detail;LUE deficits/detail RUE Deficits / Details: Holding hand tightly fisted. Unsure of baseline. R shoudler with apparent ant/inf subluxation. Pt sttesthsi is baseline? Apraxic. Attempts to use RUE to assist but unable to open fist to grasp items. Able to complete hand to mouth movement wit gravity eliminated.  RUE Sensation: decreased proprioception RUE Coordination: decreased fine motor;decreased gross motor LUE Deficits / Details: limb apraxia. unable to raise  L shoulder against gravity from bed, but able to elicit active shoudler flexion with assistance. elbow flexion /ext agains gravity. Able to complete self feeding movment. difficulty releasing itmes on command, but able to use L hand funcitonally with dysmetric movemetns.  LUE Coordination: decreased fine motor;decreased gross motor   Lower Extremity Assessment Lower Extremity Assessment: Defer to PT evaluation RLE Deficits / Details:  ("frog leg position") LLE Deficits / Details: passively extended leg. Able  to actively flex hp and knee. No movment noted in foot. States L leg was his strong leg. c/o pain L foot.   Cervical / Trunk Assessment Cervical / Trunk Assessment: Other exceptions Cervical / Trunk Exceptions: poor trunk control   Communication     Cognition Arousal/Alertness: Awake/alert Behavior During Therapy: Flat affect (labile) Overall Cognitive Status: Impaired/Different from baseline Area of Impairment: Orientation;Attention;Memory;Following commands;Safety/judgement;Awareness;Problem solving Orientation Level: Disoriented to;Time Current Attention Level: Sustained (exteranlly distracted)   Following Commands: Follows one step commands with increased time   Awareness: Emergent Problem Solving: Slow processing;Difficulty sequencing     General Comments       Exercises       Shoulder Instructions  Instructed daughter to keep RUE elevated/supported on 2 pillows due to subluxed shoulder    Home Living Family/patient expects to be discharged to:: Inpatient rehab                                 Additional Comments: PTA, pt lived with wife and 28 yo son  Lives With: Spouse;Son    Prior Functioning/Environment Level of Independence: Independent with assistive device(s)  Gait / Transfers Assistance Needed: ambulated with cane. Mod I with ADL     Comments: daughter states that pt swims and loves to fish    OT Diagnosis:     OT Problem List: Decreased strength;Decreased range of motion;Decreased activity tolerance;Impaired balance (sitting and/or standing);Impaired vision/perception;Decreased coordination;Decreased cognition;Decreased safety awareness;Decreased knowledge of use of DME or AE;Decreased knowledge of precautions;Cardiopulmonary status limiting activity;Impaired sensation;Impaired tone;Impaired UE functional use;Pain;Increased edema   OT Treatment/Interventions: Self-care/ADL training;Therapeutic exercise;Neuromuscular education;DME and/or AE  instruction;Therapeutic activities;Cognitive remediation/compensation;Visual/perceptual remediation/compensation;Patient/family education;Balance training    OT Goals(Current goals can be found in the care plan section) Acute Rehab OT Goals Patient Stated Goal: to get better OT Goal Formulation: With patient/family Time For Goal Achievement: 09/10/13 Potential to Achieve Goals: Good  OT Frequency: Min 3X/week   Barriers to D/C:  (unsure of level of caregiver suppot)          Co-evaluation              End of Session Nurse Communication: Mobility status;Other (comment);Precautions;Need for lift equipment (will need to use maximove; support R UE with 2 pillows; keep)  Activity Tolerance: Patient tolerated treatment well Patient left: in bed;with call bell/phone within reach;with family/visitor present   Time: 1230-1301 OT Time Calculation (min): 31 min Charges:  OT General Charges $OT Visit: 1 Procedure OT Evaluation $Initial OT Evaluation Tier I: 1 Procedure OT Treatments $Self Care/Home Management : 8-22 mins G-Codes:    Roney Jaffe Ellenore Roscoe Sep 21, 2013, 2:15 PM   [place], OTR/L  [phone] 09/21/2013

## 2013-08-27 NOTE — Care Management Note (Unsigned)
    Page 1 of 1   08/27/2013     4:07:08 PM CARE MANAGEMENT NOTE 08/27/2013  Patient:  [facility]   Account Number:  [id number]  Date Initiated:  08/23/2013  Documentation initiated by:  Luz Lex  Subjective/Objective Assessment:   Admitted with weakness - found to be hypertensive.     Action/Plan:   Anticipated DC Date:  08/27/2013   Anticipated DC Plan:  [place]  CM consult      Choice offered to / List presented to:             Status of service:  In process, will continue to follow Medicare Important Message given?   (If response is "NO", the following Medicare IM given date fields will be blank) Date Medicare IM given:   Date Additional Medicare IM given:    Discharge Disposition:    Per UR Regulation:  Reviewed for med. necessity/level of care/duration of stay  If discussed at [place] of Stay Meetings, dates discussed:   08/28/2013    Comments:  ContactKenard Gower Spouse [phone]  08-27-13 [address] RN,BSN [phone] Per MD notes planto taper and discontinue Cardene. Systolic blood pressure goal <180. CIR has been consulted. CM will continue to monitor for disposition needs.

## 2013-08-27 NOTE — Progress Notes (Signed)
Occupational Therapy Treatment Patient Details Name: [name] MRN: [id number] DOB: 03/12/1951 Today's Date: 08/27/2013    History of present illness Pt with right basal ganglia hemorrhage, additional multi focal ischemic infarcts involving the bilateral parietal and temporal lobes as well as the right occipital lobe.  Remote infarcts involving the left basal ganglia/left corona radiata and right middle cerebellar peduncle. 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    OT comments  Focus of second session on increasing independence with self feeding. Pt with increased ability to self fed using compensatory techniques, adjusted positioning to compensate for decreased L shoulder strength and apparent limb apraxia. Pt using theratubing anad "sippy cup" with straw. Will continue to follow.   Follow Up Recommendations  CIR;Supervision/Assistance - 24 hour    Equipment Recommendations  3 in 1 bedside comode;Tub/shower bench;Wheelchair (measurements OT);Wheelchair cushion (measurements OT);Hospital bed    Recommendations for Other Services Rehab consult    Precautions / Restrictions Precautions Precautions: Fall       Mobility Bed Mobility Overal bed mobility: +2 for physical assistance;Needs Assistance                Transfers Overall transfer level:  (currently need to use lift system - maximove)                    Balance Overall balance assessment: Needs assistance Sitting-balance support: Bilateral upper extremity supported Sitting balance-Leahy Scale: Poor Sitting balance - Comments: Unable to maintain postural control upright in bed without assistance. Using BUE to help position at midline                           ADL Overall ADL's : Needs assistance/impaired Eating/Feeding: Moderate assistance;Sitting;Bed level   Grooming: Total assistance;Bed level   Upper Body Bathing: Total assistance;Bed level   Lower Body Bathing: Total  assistance;Bed level   Upper Body Dressing : Total assistance;Bed level   Lower Body Dressing: Total assistance;Bed level               Functional mobility during ADLs:  (Needs +2) General ADL Comments: Second session focusedon teaching pt/family compensatory techniques. Tray placed over pt on L side. elbow positioned on table with towel underneathto cushion elbow. Spoon adapted with curve to bowl. used theratubing to build up handle. physical cues for bite siae. Assisted with loading utensil. Given sippy cup to loop L fingers through to bring to mouth using straw in cup. Daughter able to return demonstrate. Pt very happy about self feeding.      Vision                 Additional Comments: poor saccades and tracking. ? L field cut   Perception Perception Perception Tested?: No   Praxis Praxis Praxis tested?: Deficits Deficits: Perseveration;Limb apraxia;Ideomotor    Cognition   Behavior During Therapy: Flat affect Overall Cognitive Status: Impaired/Different from baseline Area of Impairment: Orientation;Attention;Memory;Following commands;Safety/judgement;Awareness;Problem solving Orientation Level: Disoriented to;Time Current Attention Level: Sustained (exteranlly distracted)    Following Commands: Follows one step commands with increased time   Awareness: Emergent Problem Solving: Slow processing;Difficulty sequencing      Extremity/Trunk Assessment  Upper Extremity Assessment Upper Extremity Assessment: RUE deficits/detail;LUE deficits/detail RUE Deficits / Details: Holding hand tightly fisted. Unsure of baseline. R shoudler with apparent ant/inf subluxation. Pt sttesthsi is baseline? Apraxic. Attempts to use RUE to assist but unable to open fist to grasp items. Able to complete  hand to mouth movement wit gravity eliminated.  RUE Sensation: decreased proprioception RUE Coordination: decreased fine motor;decreased gross motor LUE Deficits / Details: limb  apraxia. unable to raise L shoulder against gravity from bed, but able to elicit active shoudler flexion with assistance. elbow flexion /ext agains gravity. Able to complete self feeding movment. difficulty releasing itmes on command, but able to use L hand funcitonally with dysmetric movemetns.  LUE Coordination: decreased fine motor;decreased gross motor   Lower Extremity Assessment Lower Extremity Assessment: Defer to PT evaluation RLE Deficits / Details:  ("frog leg position") LLE Deficits / Details: passively extended leg. Able to actively flex hp and knee. No movment noted in foot. States L leg was his strong leg. c/o pain L foot.   Cervical / Trunk Assessment Cervical / Trunk Assessment: Other exceptions Cervical / Trunk Exceptions: poor trunk control    Exercises Other Exercises Other Exercises: daughter educated onproper positionoing for RUE on 2 pillows Other Exercises: daughter educated on hand over hand AAROM RUE for hand to mouth pattern with elbow supported   Shoulder Instructions       General Comments      Pertinent Vitals/ Pain       Painful L foot.  Home Living Family/patient expects to be discharged to:: Inpatient rehab                                 Additional Comments: PTA, pt lived with wife and 61 yo son  Lives With: Spouse;Son    Prior Functioning/Environment Level of Independence: Independent with assistive device(s)  Gait / Transfers Assistance Needed: ambulated with cane. Mod I with ADL     Comments: daughter states that pt swims and loves to fish   Frequency Min 3X/week     Progress Toward Goals  OT Goals(current goals can now be found in the care plan section)  Progress towards OT goals: Progressing toward goals  Acute Rehab OT Goals Patient Stated Goal: to get better OT Goal Formulation: With patient/family Time For Goal Achievement: 09/10/13 Potential to Achieve Goals: Good  Plan Discharge plan remains appropriate     Co-evaluation                 End of Session Equipment Utilized During Treatment: Other (comment) (AE)   Activity Tolerance Patient tolerated treatment well   Patient Left in bed;with call bell/phone within reach;with family/visitor present   Nurse Communication Mobility status;Other (comment) (nursing tech educated on proper set up for feeding)        Time: 3825-0539 OT Time Calculation (min): 25 min  Charges: OT General Charges $OT Visit: 1 Procedure OT Evaluation $Initial OT Evaluation Tier I: 1 Procedure OT Treatments $Self Care/Home Management : 23-37 mins  Roney Jaffe [name] 08/27/2013, 2:46 PM   [facility], OTR/L  [phone] 08/27/2013

## 2013-08-27 NOTE — Progress Notes (Signed)
Physical Therapy Treatment Patient Details Name: Edmon Magid MRN: [id number] DOB: 1951-03-31 Today's Date: 08/27/2013    History of Present Illness Pt with right basal ganglia hemorrhage, additional multi focal ischemic infarcts involving the bilateral parietal and temporal lobes as well as the right occipital lobe.  Remote infarcts involving the left basal ganglia/left corona radiata and right middle cerebellar peduncle. 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     PT Comments    Patient with min to no LE movement.  Noted decreased tone BLE's, with feet in supinated/plantarflexed positions.  In sitting, unable to maintain balance, with decreased tone in trunk.  Agree with need for extensive therapies post-acute.  Unsure of family's ability to provide 24 hour assist.   Follow Up Recommendations  CIR;Supervision/Assistance - 24 hour     Equipment Recommendations  Wheelchair (measurements PT);Wheelchair cushion (measurements PT)    Recommendations for Other Services Rehab consult     Precautions / Restrictions Precautions Precautions: Fall Restrictions Weight Bearing Restrictions: No    Mobility  Bed Mobility Overal bed mobility: Needs Assistance;+2 for physical assistance Bed Mobility: Supine to Sit;Sit to Supine     Supine to sit: Max assist;+2 for physical assistance Sit to supine: Max assist;+2 for physical assistance   General bed mobility comments: Verbal and tactile cues for technique.  Patient reaching for PT's arm to pull self up.  Instructed patient to push up with UE's - patient unable to use RUE and continued to reach with LUE.  Assist to move LE's off of bed and bring trunk to sitting position.  Once sitting, patient required max assist to maintain sitting balance.  Attempted to have patient use LUE to support weight - unable to problem-solve how to do this.  Not using RUE for support at all.  Patient sat EOB x8 minutes.  Facilitated lower trunk extension  - unable to maintain.  Returned to supine with +2 assist.  Positioned LE's in neutral position with pillows on lateral aspect of knees.   Transfers                 General transfer comment: Unable to bear any weight on LE's.  Ambulation/Gait                 Stairs            Wheelchair Mobility    Modified Rankin (Stroke Patients Only) Modified Rankin (Stroke Patients Only) Pre-Morbid Rankin Score: Moderate disability Modified Rankin: Severe disability     Balance Overall balance assessment: Needs assistance Sitting-balance support: Single extremity supported;Feet unsupported Sitting balance-Leahy Scale: Poor Sitting balance - Comments: Patient unable to maintain balance today at EOB.  Not functionally using RUE for support.  Attempting to use LUE - unable to support self.  LE's with decreased tone, feet rolled into supination/plantarflexion. Postural control: Posterior lean;Left lateral lean                          Cognition Arousal/Alertness: Awake/alert Behavior During Therapy: Flat affect Overall Cognitive Status: Difficult to assess Area of Impairment: Following commands;Problem solving       Following Commands: Follows one step commands with increased time     Problem Solving: Slow processing;Decreased initiation;Difficulty sequencing;Requires verbal cues      Exercises Other Exercises Other Exercises: daughter educated onproper positionoing for RUE on 2 pillows Other Exercises: daughter educated on hand over hand AAROM RUE for hand to mouth pattern with  elbow supported    General Comments        Pertinent Vitals/Pain Pain Lt foot continues.    Home Living                      Prior Function            PT Goals (current goals can now be found in the care plan section) Acute Rehab PT Goals Patient Stated Goal: to get better Progress towards PT goals: Progressing toward goals    Frequency  Min 3X/week     PT Plan Current plan remains appropriate    Co-evaluation             End of Session   Activity Tolerance: Patient limited by fatigue;Patient limited by pain Patient left: in bed;with call bell/phone within reach;with family/visitor present     Time: 1410-1434 PT Time Calculation (min): 24 min  Charges:  $Therapeutic Activity: 23-37 mins                    G Codes:      Despina Pole 09/21/2013, 5:22 PM Carita Pian. Sanjuana Kava, [place] Pager [phone]

## 2013-08-27 NOTE — Consult Note (Signed)
Physical Medicine and Rehabilitation Consult  Reason for Consult: Paraparesis, LUE weakness in patient with postpolio syndrome.  Referring Physician: Dr. Leonie Man   HPI: [name] is a 63 y.o. 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  CT head with acute Intraparenchymal hemorrhage right basal ganglia. He had increase in lethargy on 05/14 and follow up CCT with additional multifocal ischemic infarcts involving bilateral parietal and temporal lobes as well as right occipital lobe. 2D echo with EF 65-70% with no wall abnormality. Carotid dopplers without ICA stenosis. Patient with resultant paraparesis as well as LUE weakness, apraxia, decreased verbal output with naming errors as well as difficulty with multistep commands. CIR recommended by rehab team.    Review of Systems  Unable to perform ROS Respiratory: Positive for cough.        Runny nose    Past Medical History  Diagnosis Date  . Hypertension   . Polio    History reviewed. No pertinent past surgical history. Family History  Problem Relation Age of Onset  . Hypertension Mother   . Hypertension Father    Social History:  Lives with wife and family. Has been in [place] for 20+ years?  Wife works at nights? He reports that he has been smoking Cigarettes--< 1 PPD.  He has been smoking about 0.00 packs per day. He does not have any smokeless tobacco history on file. Per reports that he does not drink alcohol or use illicit drugs.  Allergies: No Known Allergies  Medications Prior to Admission  Medication Sig Dispense Refill  . cloNIDine (CATAPRES) 0.2 MG tablet Take 0.2 mg by mouth 2 (two) times daily.      . hydrochlorothiazide (HYDRODIURIL) 25 MG  tablet Take 25 mg by mouth daily.      Marland Kitchen lisinopril (PRINIVIL,ZESTRIL) 10 MG tablet Take 10 mg by mouth daily.      . naproxen (NAPROSYN) 500 MG tablet Take 500 mg by mouth 2 (two) times daily with a meal.        Home: Home Living Family/patient expects to be discharged to:: Inpatient rehab Living Arrangements: Spouse/significant other Available Help at Discharge: Family Additional Comments: PTA, pt lived with wife and 15 yo son  Lives With: Spouse;Son  Functional History: Prior Function Level of Independence: Independent with assistive device(s) Gait / Transfers Assistance Needed: ambulated with cane. Mod I with ADL Comments: daughter states that pt swims and loves to fish Functional Status:  Mobility: Bed Mobility Overal bed mobility: Needs Assistance;+2 for physical assistance Bed Mobility: Supine to Sit;Sit to Supine Supine to sit: Max assist;+2 for physical assistance Sit to supine: Max assist;+2 for physical assistance General bed mobility comments: Verbal and tactile cues for technique.  Patient reaching for PT's arm to pull self up.  Instructed patient to push up with UE's - patient unable to use RUE and continued to reach with LUE.  Assist to move LE's off of bed and bring trunk to sitting position.  Once sitting, patient required max assist to maintain sitting balance.  Attempted to have patient use LUE to support weight - unable to problem-solve how to do this.  Not using RUE for support at all.  Patient  sat EOB x8 minutes.  Facilitated lower trunk extension - unable to maintain.  Returned to supine with +2 assist.  Positioned LE's in neutral position with pillows on lateral aspect of knees.  Transfers Overall transfer level:  (currently need to use lift system - maximove) General transfer comment: Unable to bear any weight on LE's.      ADL: ADL Overall ADL's : Needs assistance/impaired Eating/Feeding: Moderate assistance;Sitting;Bed level Grooming: Total assistance;Bed  level Upper Body Bathing: Total assistance;Bed level Lower Body Bathing: Total assistance;Bed level Upper Body Dressing : Total assistance;Bed level Lower Body Dressing: Total assistance;Bed level Functional mobility during ADLs:  (Needs +2) General ADL Comments: Second session focusedon teaching pt/family compensatory techniques. Tray placed over pt on L side. elbow positioned on table with towel underneathto cushion elbow. Spoon adapted with curve to bowl. used theratubing to build up handle. physical cues for bite siae. Assisted with loading utensil. Given sippy cup to loop L fingers through to bring to mouth using straw in cup. Daughter able to return demonstrate. Pt very happy about self feeding.  Cognition: Cognition Overall Cognitive Status: Difficult to assess Arousal/Alertness: Awake/alert Orientation Level: Oriented to person;Oriented to place;Oriented to time;Disoriented to situation (difficult neuro assessment due to language barrier) Attention: Focused;Sustained Focused Attention: Appears intact Sustained Attention: Appears intact Awareness: Impaired Awareness Impairment: Intellectual impairment Safety/Judgment: Impaired Cognition Arousal/Alertness: Awake/alert Behavior During Therapy: Flat affect Overall Cognitive Status: Difficult to assess Area of Impairment: Following commands;Problem solving Orientation Level: Disoriented to;Time Current Attention Level: Sustained (exteranlly distracted) Following Commands: Follows one step commands with increased time Awareness: Emergent Problem Solving: Slow processing;Decreased initiation;Difficulty sequencing;Requires verbal cues Difficult to assess due to: Non-English speaking  Blood pressure 169/85, pulse 57, temperature 97.9 F (36.6 C), temperature source Oral, resp. rate 22, height 5' (1.524 m), weight 56 kg (123 lb 7.3 oz), SpO2 98.00%. Physical Exam  Nursing note and vitals reviewed. Constitutional: He has a sickly  appearance.  Thin, ill appearing male with watery eyes with runny nose noted.   HENT:  Head: Normocephalic and atraumatic.  Eyes: Pupils are equal, round, and reactive to light.  Neck: Normal range of motion. Neck supple.  Cardiovascular: Normal rate and regular rhythm.   Respiratory: Effort normal and breath sounds normal.  GI: Soft. Bowel sounds are normal. He exhibits no distension.  Musculoskeletal:  Numerous RA deformities of hands/feet, both shoulders subluxed, right more than left. Had pain with PROM of numerous joints.   Neurological: He is alert.  Right facial paresis. Patient was able to answer occasional biographic question but expressive deficits as well as tendency to revert to [place].  BLE and RUE paresis noted. Was able to extend fingers on right hand with minimal assistance. Kept BLE flexed in frog leg position and had pain with attempts at extension of bilateral knees < feet. .  Multiple deformities BLE and bilateral hands noted.   Skin: Skin is warm and dry. Rash (Fine macular rash noted on bilateral thighs and RUE) noted.  Psychiatric: He has a normal mood and affect. His behavior is normal. Judgment and thought content normal.    No results found for this or any previous visit (from the past 24 hour(s)). Dg Chest 2 View  08/27/2013   CLINICAL DATA:  Stroke.  EXAM: CHEST  2 VIEW  COMPARISON:  Chest CT 07/21/2005.  FINDINGS: Patchy bilateral pulmonary infiltrates are noted most consistent with pneumonia, possibly related to aspiration. Interstitial pulmonary edema cannot be excluded. Cardiomegaly is present with normal pulmonary vascularity.  No pleural effusion or pneumothorax.  IMPRESSION: Patchy pulmonary infiltrates noted bilaterally most consistent with pneumonia.   Electronically Signed   By: Marcello Moores  Register   On: 08/27/2013 12:21   Dg Ankle Left Port  08/26/2013   CLINICAL DATA:  Pain and limited range of motion  EXAM: PORTABLE LEFT ANKLE - 2 VIEW  COMPARISON:   None.  FINDINGS: Frontal and lateral views were obtained. There is no acute fracture. Ankle mortise appears intact. There is remodeling of the tailor dome suggesting prior trauma in this area. There is narrowing posteriorly consistent with a degree of osteoarthritic change. There is pes planus. Bones appear somewhat osteoporotic.  IMPRESSION: Question old trauma to the talus with remodeling. 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 No acute fracture. Mortise appears grossly intact. No joint effusion. Bones osteoporotic.   Electronically Signed   By: Lowella Grip M.D.   On: 08/26/2013 13:44    Assessment/Plan: Diagnosis: ICH, embolic infarcts in a patient with post-polio syndrome 1. Does the need for close, 24 hr/day medical supervision in concert with the patient's rehab needs make it unreasonable for this patient to be served in a less intensive setting? Yes 2. Co-Morbidities requiring supervision/potential complications: pain, joint deformities 3. Due to bladder management, bowel management, safety, skin/wound care, disease management, medication administration, pain management and patient education, does the patient require 24 hr/day rehab nursing? Yes 4. Does the patient require coordinated care of a physician, rehab nurse, PT (1-2 hrs/day, 5 days/week), OT (1-2 hrs/day, 5 days/week) and SLP (1-2 hrs/day, 5 days/week) to address physical and functional deficits in the context of the above medical diagnosis(es)? Yes Addressing deficits in the following areas: balance, endurance, locomotion, strength, transferring, bowel/bladder control, bathing, dressing, feeding, grooming, toileting, cognition, speech, language, swallowing and psychosocial support 5. Can the patient actively participate in an intensive therapy program of at least 3 hrs of therapy per day at least 5 days per week? Yes and Potentially 6. The potential for patient to make measurable gains while on inpatient rehab is  excellent 7. Anticipated functional outcomes upon discharge from inpatient rehab are min assist and mod assist  with PT, min assist and mod assist with OT, supervision with SLP. 8. Estimated rehab length of stay to reach the above functional goals is: 20-28 days 9. Does the patient have adequate social supports to accommodate these discharge functional goals? Yes and Potentially 10. Anticipated D/C setting: Home 11. Anticipated post D/C treatments: HH therapy and Outpatient therapy 12. Overall Rehab/Functional Prognosis: good  RECOMMENDATIONS: This patient's condition is appropriate for continued rehabilitative care in the following setting: CIR Patient has agreed to participate in recommended program. Yes Note that insurance prior authorization may be required for reimbursement for recommended care.  Comment: Rehab Admissions Coordinator to follow up.  Thanks,  Meredith Staggers, MD, [facility]     08/28/2013

## 2013-08-27 NOTE — Progress Notes (Signed)
Report called to [facility].

## 2013-08-28 ENCOUNTER — Inpatient Hospital Stay (HOSPITAL_COMMUNITY)
Admission: RE | Admit: 2013-08-28 | Discharge: 2013-09-14 | DRG: 945 | Disposition: A | Discharge status: Skilled Nursing Facility | Payer: Medicare Other | Source: Intra-hospital | Attending: Physical Medicine & Rehabilitation | Admitting: Physical Medicine & Rehabilitation

## 2013-08-28 DIAGNOSIS — F17200 Nicotine dependence, unspecified, uncomplicated: Secondary | ICD-10-CM | POA: Diagnosis present

## 2013-08-28 DIAGNOSIS — F418 Other specified anxiety disorders: Secondary | ICD-10-CM | POA: Diagnosis present

## 2013-08-28 DIAGNOSIS — M109 Gout, unspecified: Secondary | ICD-10-CM | POA: Diagnosis not present

## 2013-08-28 DIAGNOSIS — M7752 Other enthesopathy of left foot: Secondary | ICD-10-CM | POA: Diagnosis present

## 2013-08-28 DIAGNOSIS — N289 Disorder of kidney and ureter, unspecified: Secondary | ICD-10-CM | POA: Diagnosis not present

## 2013-08-28 DIAGNOSIS — M7750 Other enthesopathy of unspecified foot: Secondary | ICD-10-CM | POA: Diagnosis present

## 2013-08-28 DIAGNOSIS — B91 Sequelae of poliomyelitis: Secondary | ICD-10-CM

## 2013-08-28 DIAGNOSIS — F341 Dysthymic disorder: Secondary | ICD-10-CM | POA: Diagnosis present

## 2013-08-28 DIAGNOSIS — M7989 Other specified soft tissue disorders: Secondary | ICD-10-CM | POA: Diagnosis not present

## 2013-08-28 DIAGNOSIS — M79609 Pain in unspecified limb: Secondary | ICD-10-CM | POA: Diagnosis not present

## 2013-08-28 DIAGNOSIS — G1221 Amyotrophic lateral sclerosis: Secondary | ICD-10-CM | POA: Diagnosis not present

## 2013-08-28 DIAGNOSIS — I6340 Cerebral infarction due to embolism of unspecified cerebral artery: Secondary | ICD-10-CM | POA: Diagnosis not present

## 2013-08-28 DIAGNOSIS — C384 Malignant neoplasm of pleura: Secondary | ICD-10-CM | POA: Diagnosis not present

## 2013-08-28 DIAGNOSIS — R52 Pain, unspecified: Secondary | ICD-10-CM

## 2013-08-28 DIAGNOSIS — R488 Other symbolic dysfunctions: Secondary | ICD-10-CM | POA: Diagnosis present

## 2013-08-28 DIAGNOSIS — R918 Other nonspecific abnormal finding of lung field: Secondary | ICD-10-CM | POA: Diagnosis not present

## 2013-08-28 DIAGNOSIS — G8220 Paraplegia, unspecified: Secondary | ICD-10-CM | POA: Diagnosis present

## 2013-08-28 DIAGNOSIS — Z8249 Family history of ischemic heart disease and other diseases of the circulatory system: Secondary | ICD-10-CM | POA: Diagnosis not present

## 2013-08-28 DIAGNOSIS — E860 Dehydration: Secondary | ICD-10-CM | POA: Diagnosis not present

## 2013-08-28 DIAGNOSIS — M7660 Achilles tendinitis, unspecified leg: Secondary | ICD-10-CM | POA: Diagnosis present

## 2013-08-28 DIAGNOSIS — L89899 Pressure ulcer of other site, unspecified stage: Secondary | ICD-10-CM | POA: Diagnosis present

## 2013-08-28 DIAGNOSIS — K5900 Constipation, unspecified: Secondary | ICD-10-CM | POA: Diagnosis not present

## 2013-08-28 DIAGNOSIS — R404 Transient alteration of awareness: Secondary | ICD-10-CM | POA: Diagnosis present

## 2013-08-28 DIAGNOSIS — F3289 Other specified depressive episodes: Secondary | ICD-10-CM | POA: Diagnosis not present

## 2013-08-28 DIAGNOSIS — F329 Major depressive disorder, single episode, unspecified: Secondary | ICD-10-CM | POA: Diagnosis not present

## 2013-08-28 DIAGNOSIS — R4789 Other speech disturbances: Secondary | ICD-10-CM | POA: Diagnosis not present

## 2013-08-28 DIAGNOSIS — K219 Gastro-esophageal reflux disease without esophagitis: Secondary | ICD-10-CM | POA: Diagnosis not present

## 2013-08-28 DIAGNOSIS — R279 Unspecified lack of coordination: Secondary | ICD-10-CM | POA: Diagnosis not present

## 2013-08-28 DIAGNOSIS — I10 Essential (primary) hypertension: Secondary | ICD-10-CM | POA: Diagnosis not present

## 2013-08-28 DIAGNOSIS — M19072 Primary osteoarthritis, left ankle and foot: Secondary | ICD-10-CM | POA: Diagnosis present

## 2013-08-28 DIAGNOSIS — Z5189 Encounter for other specified aftercare: Secondary | ICD-10-CM | POA: Diagnosis not present

## 2013-08-28 DIAGNOSIS — M6281 Muscle weakness (generalized): Secondary | ICD-10-CM | POA: Diagnosis not present

## 2013-08-28 DIAGNOSIS — L8990 Pressure ulcer of unspecified site, unspecified stage: Secondary | ICD-10-CM | POA: Diagnosis present

## 2013-08-28 DIAGNOSIS — E46 Unspecified protein-calorie malnutrition: Secondary | ICD-10-CM | POA: Diagnosis not present

## 2013-08-28 DIAGNOSIS — I619 Nontraumatic intracerebral hemorrhage, unspecified: Secondary | ICD-10-CM | POA: Diagnosis present

## 2013-08-28 DIAGNOSIS — M25579 Pain in unspecified ankle and joints of unspecified foot: Secondary | ICD-10-CM | POA: Diagnosis not present

## 2013-08-28 DIAGNOSIS — G40909 Epilepsy, unspecified, not intractable, without status epilepticus: Secondary | ICD-10-CM | POA: Diagnosis not present

## 2013-08-28 DIAGNOSIS — G14 Postpolio syndrome: Secondary | ICD-10-CM

## 2013-08-28 DIAGNOSIS — I639 Cerebral infarction, unspecified: Secondary | ICD-10-CM | POA: Diagnosis present

## 2013-08-28 MED ORDER — ALUM & MAG HYDROXIDE-SIMETH 200-200-20 MG/5ML PO SUSP
30.0000 mL | ORAL | Status: DC | PRN
Start: 2013-08-28 — End: 2013-09-14

## 2013-08-28 MED ORDER — LISINOPRIL 20 MG PO TABS
20.0000 mg | ORAL_TABLET | Freq: Every day | ORAL | Status: DC
  Administered 2013-08-29 – 2013-08-30 (×2): 20 mg via ORAL
  Filled 2013-08-28 (×4): qty 1

## 2013-08-28 MED ORDER — PANTOPRAZOLE SODIUM 40 MG PO TBEC
40.0000 mg | DELAYED_RELEASE_TABLET | Freq: Every day | ORAL | Status: DC
  Administered 2013-08-28 – 2013-09-13 (×17): 40 mg via ORAL
  Filled 2013-08-28 (×17): qty 1

## 2013-08-28 MED ORDER — TRAMADOL HCL 50 MG PO TABS
50.0000 mg | ORAL_TABLET | Freq: Four times a day (QID) | ORAL | Status: DC | PRN
  Administered 2013-08-31 – 2013-09-14 (×10): 50 mg via ORAL
  Filled 2013-08-28 (×11): qty 1

## 2013-08-28 MED ORDER — AMLODIPINE BESYLATE 10 MG PO TABS: 10.0000 mg | ORAL_TABLET | Freq: Every day | ORAL | Status: DC

## 2013-08-28 MED ORDER — POLYETHYLENE GLYCOL 3350 17 G PO PACK
17.0000 g | PACK | Freq: Every day | ORAL | Status: DC | PRN
  Administered 2013-09-14: 17 g via ORAL
  Filled 2013-08-28: qty 1

## 2013-08-28 MED ORDER — POTASSIUM CHLORIDE CRYS ER 10 MEQ PO TBCR
10.0000 meq | EXTENDED_RELEASE_TABLET | Freq: Two times a day (BID) | ORAL | Status: DC
  Administered 2013-08-28 – 2013-09-05 (×16): 10 meq via ORAL
  Filled 2013-08-28 (×19): qty 1

## 2013-08-28 MED ORDER — TRAZODONE HCL 50 MG PO TABS: 25.0000 mg | ORAL_TABLET | Freq: Every evening | ORAL | Status: DC | PRN

## 2013-08-28 MED ORDER — FLEET ENEMA 7-19 GM/118ML RE ENEM: 1.0000 | ENEMA | Freq: Once | RECTAL | Status: AC | PRN

## 2013-08-28 MED ORDER — HYDROCHLOROTHIAZIDE 25 MG PO TABS
25.0000 mg | ORAL_TABLET | Freq: Every day | ORAL | Status: DC
  Administered 2013-08-29 – 2013-08-30 (×2): 25 mg via ORAL
  Filled 2013-08-28 (×4): qty 1

## 2013-08-28 MED ORDER — TRAMADOL HCL 50 MG PO TABS
50.0000 mg | ORAL_TABLET | Freq: Four times a day (QID) | ORAL | Status: DC | PRN
  Filled 2013-08-28: qty 1

## 2013-08-28 MED ORDER — GUAIFENESIN-DM 100-10 MG/5ML PO SYRP: 5.0000 mL | ORAL_SOLUTION | Freq: Four times a day (QID) | ORAL | Status: DC | PRN

## 2013-08-28 MED ORDER — BISACODYL 10 MG RE SUPP: 10.0000 mg | Freq: Every day | RECTAL | Status: DC | PRN

## 2013-08-28 MED ORDER — PROCHLORPERAZINE EDISYLATE 5 MG/ML IJ SOLN
5.0000 mg | Freq: Four times a day (QID) | INTRAMUSCULAR | Status: DC | PRN
  Filled 2013-08-28: qty 2

## 2013-08-28 MED ORDER — ACETAMINOPHEN 325 MG PO TABS
325.0000 mg | ORAL_TABLET | ORAL | Status: DC | PRN
  Administered 2013-08-29 – 2013-09-12 (×3): 650 mg via ORAL
  Filled 2013-08-28 (×3): qty 2

## 2013-08-28 MED ORDER — PROCHLORPERAZINE MALEATE 5 MG PO TABS
5.0000 mg | ORAL_TABLET | Freq: Four times a day (QID) | ORAL | Status: DC | PRN
  Filled 2013-08-28: qty 2

## 2013-08-28 MED ORDER — LEVOFLOXACIN 750 MG PO TABS
750.0000 mg | ORAL_TABLET | Freq: Every day | ORAL | Status: DC
  Administered 2013-08-28: 750 mg via ORAL
  Filled 2013-08-28 (×2): qty 1

## 2013-08-28 MED ORDER — ENOXAPARIN SODIUM 40 MG/0.4ML ~~LOC~~ SOLN
40.0000 mg | SUBCUTANEOUS | Status: DC
  Administered 2013-08-28 – 2013-09-13 (×17): 40 mg via SUBCUTANEOUS
  Filled 2013-08-28 (×18): qty 0.4

## 2013-08-28 MED ORDER — LABETALOL HCL 5 MG/ML IV SOLN: 10.0000 mg | INTRAVENOUS | Status: DC | PRN

## 2013-08-28 MED ORDER — LABETALOL HCL 5 MG/ML IV SOLN
10.0000 mg | Freq: Once | INTRAVENOUS | Status: AC
  Administered 2013-08-28: 10 mg via INTRAVENOUS
  Filled 2013-08-28: qty 4

## 2013-08-28 MED ORDER — SENNOSIDES-DOCUSATE SODIUM 8.6-50 MG PO TABS
1.0000 | ORAL_TABLET | Freq: Two times a day (BID) | ORAL | Status: DC
  Administered 2013-08-28 – 2013-09-13 (×32): 1 via ORAL
  Filled 2013-08-28 (×10): qty 1
  Filled 2013-08-28: qty 2
  Filled 2013-08-28 (×23): qty 1

## 2013-08-28 MED ORDER — PROCHLORPERAZINE 25 MG RE SUPP
12.5000 mg | Freq: Four times a day (QID) | RECTAL | Status: DC | PRN
  Filled 2013-08-28: qty 1

## 2013-08-28 MED ORDER — ENSURE COMPLETE PO LIQD
237.0000 mL | Freq: Two times a day (BID) | ORAL | Status: DC
  Administered 2013-08-30 – 2013-09-13 (×22): 237 mL via ORAL

## 2013-08-28 MED ORDER — AMLODIPINE BESYLATE 5 MG PO TABS
5.0000 mg | ORAL_TABLET | Freq: Once | ORAL | Status: AC
  Administered 2013-08-28: 5 mg via ORAL
  Filled 2013-08-28: qty 1

## 2013-08-28 NOTE — Progress Notes (Signed)
Rehab admissions - Evaluated for possible admission.  I met with patient and his grandson at the bedside.  I gave them rehab booklets and explained inpatient rehab.  They are in agreement to inpatient rehab admission.  Bed available and will admit to acute inpatient rehab today.  Call me for questions.  #[phone]

## 2013-08-28 NOTE — Progress Notes (Signed)
Report called to [facility] on [address]. Patient will be transferred to unit once dinner is finished. Burnell Blanks, RN

## 2013-08-28 NOTE — H&P (View-Only) (Signed)
Physical Medicine and Rehabilitation Admission H&P    Chief Complaint  Patient presents with  . Paraparesis, LUE weakness in patient with postpolio syndrome    HPI: [name] is a 63 y.o. 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 CT head with acute Intraparenchymal hemorrhage right basal ganglia. He had increase in lethargy on 05/14 and follow up CCT with additional multifocal ischemic infarcts involving bilateral parietal and temporal lobes as well as right occipital lobe. 2D echo with EF 65-70% with no wall abnormality. Carotid dopplers without ICA stenosis. Patient with resultant paraparesis as well as LUE weakness, apraxia, decreased verbal output with naming errors as well as difficulty with multistep commands. CIR recommended by rehab team and patient admitted today.    Review of Systems  Respiratory: Positive for cough.        Runny nose  Musculoskeletal: Positive for joint pain (left ankle> right ankle pain.) and myalgias.  Neurological: Positive for speech change.    Past Medical History  Diagnosis Date  . Hypertension   . Polio    History reviewed. No pertinent past surgical history. Family History  Problem Relation Age of Onset  . Hypertension Mother   . Hypertension Father    Social History: Lives with wife and family. Has been in [place] for 20+ years? Wife works at nights? He reports that he has been smoking Cigarettes--< 1 PPD. He has been smoking about 0.00 packs per day. He does not have any smokeless tobacco history on file. Per reports that he does not drink alcohol or use illicit drugs.    Allergies: No Known Allergies   Medications Prior to Admission  Medication Sig Dispense  Refill  . cloNIDine (CATAPRES) 0.2 MG tablet Take 0.2 mg by mouth 2 (two) times daily.      . hydrochlorothiazide (HYDRODIURIL) 25 MG tablet Take 25 mg by mouth daily.      Marland Kitchen lisinopril (PRINIVIL,ZESTRIL) 10 MG tablet Take 10 mg by mouth daily.      . naproxen (NAPROSYN) 500 MG tablet Take 500 mg by mouth 2 (two) times daily with a meal.        Home: Home Living Family/patient expects to be discharged to:: Inpatient rehab Living Arrangements: Spouse/significant other Available Help at Discharge: Family Additional Comments: PTA, pt lived with wife and 19 yo son  Lives With: Spouse;Son   Functional History: Prior Function Level of Independence: Independent with assistive device(s) Gait / Transfers Assistance Needed: ambulated with cane. Mod I with ADL Comments: daughter states that pt swims and loves to fish  Functional Status:  Mobility: Bed Mobility Overal bed mobility: Needs Assistance;+2 for physical assistance Bed Mobility: Supine to Sit;Sit to Supine Supine to sit: Max assist;+2 for physical assistance Sit to supine: Max assist;+2 for physical assistance General bed mobility comments: Verbal and tactile cues for technique.  Patient reaching for PT's arm to pull self up.  Instructed patient to push up with UE's - patient unable to use RUE and continued to reach with LUE.  Assist to move LE's off of bed and bring trunk to sitting position.  Once sitting, patient required max assist to maintain sitting balance.  Attempted to have patient use LUE to support  weight - unable to problem-solve how to do this.  Not using RUE for support at all.  Patient sat EOB x8 minutes.  Facilitated lower trunk extension - unable to maintain.  Returned to supine with +2 assist.  Positioned LE's in neutral position with pillows on lateral aspect of knees.  Transfers Overall transfer level:  (currently need to use lift system - maximove) General transfer comment: Unable to bear any weight on LE's.       ADL: ADL Overall ADL's : Needs assistance/impaired Eating/Feeding: Moderate assistance;Sitting;Bed level Grooming: Total assistance;Bed level Upper Body Bathing: Total assistance;Bed level Lower Body Bathing: Total assistance;Bed level Upper Body Dressing : Total assistance;Bed level Lower Body Dressing: Total assistance;Bed level Functional mobility during ADLs:  (Needs +2) General ADL Comments: Second session focusedon teaching pt/family compensatory techniques. Tray placed over pt on L side. elbow positioned on table with towel underneathto cushion elbow. Spoon adapted with curve to bowl. used theratubing to build up handle. physical cues for bite siae. Assisted with loading utensil. Given sippy cup to loop L fingers through to bring to mouth using straw in cup. Daughter able to return demonstrate. Pt very happy about self feeding.  Cognition: Cognition Overall Cognitive Status: Difficult to assess Arousal/Alertness: Awake/alert Orientation Level: Oriented to person;Disoriented to place;Disoriented to time;Oriented to situation (difficult neuro assessment due to language barrier) Attention: Focused;Sustained Focused Attention: Appears intact Sustained Attention: Appears intact Awareness: Impaired Awareness Impairment: Intellectual impairment Safety/Judgment: Impaired Cognition Arousal/Alertness: Awake/alert Behavior During Therapy: Flat affect Overall Cognitive Status: Difficult to assess Area of Impairment: Following commands;Problem solving Orientation Level: Disoriented to;Time Current Attention Level: Sustained (exteranlly distracted) Following Commands: Follows one step commands with increased time Awareness: Emergent Problem Solving: Slow processing;Decreased initiation;Difficulty sequencing;Requires verbal cues Difficult to assess due to: Non-English speaking   Blood pressure 169/85, pulse 60, temperature 98.3 F (36.8 C), temperature source Axillary, resp. rate 20,  height 5' (1.524 m), weight 56 kg (123 lb 7.3 oz), SpO2 100.00%. Physical Exam Nursing note and vitals reviewed.  Constitutional: He has a sickly appearance.  Thin, ill appearing male with watery eyes with runny nose noted.  HENT: oral mucosa pink/moist Head: Normocephalic and atraumatic.  Eyes: Pupils are equal, round, and reactive to light.  Neck: Normal range of motion. Neck supple.  Cardiovascular: Normal rate and regular rhythm. No murmurs Respiratory: Effort normal and breath sounds normal. No wheezes GI: Soft. Bowel sounds are normal. He exhibits no distension.  Musculoskeletal:  Numerous joint deformities of hands/feet, both shoulders subluxed, right more than left. Had pain with PROM of numerous joints and tolerated limited movement today. Neurological: He is alert.  Right facial paresis. Patient was able to answer occasional biographic questions but expressive deficits as well as tendency to revert to [place]. BLE and RUE paresis noted. Was able to extend fingers on right hand with minimal assistance. Kept BLE flexed in frog leg position and had pain with attempts at extension of bilateral knees < feet. . Multiple deformities BLE and bilateral hands noted.  Skin: Skin is warm and dry. Rash (Fine macular rash noted on bilateral thighs and RUE) noted.  Psychiatric: He has a normal mood and affect. His behavior is normal. Judgment and thought content normal.     Results for orders placed during the hospital encounter of 08/22/13 (from the past 48 hour(s))  GLUCOSE, CAPILLARY     Status: None   Collection Time    08/26/13  3:36 PM      Result Value Ref Range  Glucose-Capillary 90  70 - 99 mg/dL   Dg Chest 2 View  08/27/2013   CLINICAL DATA:  Stroke.  EXAM: CHEST  2 VIEW  COMPARISON:  Chest CT 07/21/2005.  FINDINGS: Patchy bilateral pulmonary infiltrates are noted most consistent with pneumonia, possibly related to aspiration. Interstitial pulmonary edema cannot be excluded.  Cardiomegaly is present with normal pulmonary vascularity. No pleural effusion or pneumothorax.  IMPRESSION: Patchy pulmonary infiltrates noted bilaterally most consistent with pneumonia.   Electronically Signed   By: Marcello Moores  Register   On: 08/27/2013 12:21       Medical Problem List and Plan: 1. Functional deficits secondary to [place], embolic infarcts in a patient with post-polio syndrome 2.  DVT Prophylaxis/Anticoagulation: Pharmaceutical: Lovenox 3. Pain Management:  Used naprosyn bid at home. May need to schedule tylenol--monitor for now.   4. Mood:  Provide ego support. LCSW to follow for evaluation and support.  5. Neuropsych: This patient is is not capable of making decisions on his own behalf. 6. HTN: Will monitor BP every 8 hours and adjust medication as needed for tighter control. Continue lisinopril and HCTZ 7. Bilateral patchy infiltrates/Question aspiration PNA: Will start Levaquin for treatment. Monitor for temp curve for elevation and check WBC in am.  8. 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 Routine pressure relief measures-- will order air mattress overlay.      Post Admission Physician Evaluation: 1. Functional deficits secondary  to [place], embolic infarcts in a patient with post-polio syndrome 2. Patient is admitted to receive collaborative, interdisciplinary care between the physiatrist, rehab nursing staff, and therapy team. 3. Patient's level of medical complexity and substantial therapy needs in context of that medical necessity cannot be provided at a lesser intensity of care such as a SNF. 4. Patient has experienced substantial functional loss from his/her baseline which was documented above under the "Functional History" and "Functional Status" headings.  Judging by the patient's diagnosis, physical exam, and functional history, the patient has potential for functional progress which will result in  measurable gains while on inpatient rehab.  These gains will be of substantial and practical use upon discharge  in facilitating mobility and self-care at the household level. 5. Physiatrist will provide 24 hour management of medical needs as well as oversight of the therapy plan/treatment and provide guidance as appropriate regarding the interaction of the two. 6. 24 hour rehab nursing will assist with bladder management, bowel management, safety, skin/wound care, disease management, medication administration, pain management and patient education  and help integrate therapy concepts, techniques,education, etc. 7. PT will assess and treat for/with: Lower extremity strength, range of motion, stamina, balance, functional mobility, safety, adaptive techniques and equipment, joint management, contracture control, splinting, pain control, education.   Goals are: min to mod assist. 8. OT will assess and treat for/with: ADL's, functional mobility, safety, upper extremity strength, adaptive techniques and equipment, joint/contracture control, pain mgt, family education, splinting.   Goals are: mod assist. 9. SLP will assess and treat for/with: cognition, communication, swallowing.  Goals are: supervision to min assist. 10. Case Management and Social Worker will assess and treat for psychological issues and discharge planning. 11. Team conference will be held weekly to assess progress toward goals and to determine barriers to discharge. 12. Patient will receive at least 3 hours of therapy per day at least 5 days per week. 13. ELOS: 15-22 days depending upon progress       14. Prognosis:  excellent     Meredith Staggers, MD, [facility]   08/28/2013

## 2013-08-28 NOTE — PMR Pre-admission (Signed)
PMR Admission Coordinator Pre-Admission Assessment  Patient: [name] is an 63 y.o., male MRN: [id number] DOB: 05/13/1950 Height: 5' (152.4 cm) Weight: 56 kg (123 lb 7.3 oz)              Insurance Information HMO: No    PPO:       PCP:       IPA:       80/20:       OTHER:   PRIMARY: Medicare A/B      Policy#: [id number]      Subscriber: [place] Ouch CM Name:        Phone#:       Fax#:   Pre-Cert#:        Employer: Not employed Benefits:  Phone #:       Name: Checked in [place]. Date: 03/12/09     Deduct: $1260      Out of Pocket Max: none      Life Max: unlimited CIR: 100%      SNF: 100 days Outpatient: 80%     Co-Pay: 20% Home Health: 100%      Co-Pay: none DME: 80%     Co-Pay: 20% Providers: patient's choice  SECONDARY: Medicaid of [place]      Policy#: [id number]      Subscriber: [place] Ouch CM Name:        Phone#:       Fax#:   Pre-Cert#:        Employer: Not employed Benefits:  Phone #:  [phone]      Name:   Eff. 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 He was noted to be drowsy with elevated BP in ED. CT head with acute Intraparenchymal hemorrhage right basal ganglia. He had increase in lethargy on 05/14 and follow up CCT with additional multifocal ischemic infarcts involving  bilateral parietal and temporal lobes as well as right occipital lobe.  2D echo with EF 65-70% with no wall abnormality. Carotid dopplers without ICA stenosis. Patient with resultant paraparesis as well as LUE weakness, apraxia, decreased verbal output with naming errors as well as difficulty with multistep commands. CIR recommended by rehab team.     Total: 11=NIH  Past Medical History  Past Medical History  Diagnosis Date  . Hypertension   . Polio     Family History  family history includes Hypertension in his father and mother.  Prior Rehab/Hospitalizations:  No previous rehab admissions   Current Medications  Current facility-administered medications:0.9 %  sodium chloride infusion, , Intravenous, Continuous, Loralee Pacas  Darrell Julian, PA-C, Last Rate: 75 mL/hr at 08/27/13 1418;  acetaminophen (TYLENOL) tablet 650 mg, 650 mg, Oral, Q4H PRN, Darrell Coots, PA-C;  amLODipine (NORVASC) tablet 5 mg, 5 mg, Oral, Daily, Darrell Ducking, MD, 5 mg at 08/28/13 0954;  enoxaparin (LOVENOX) injection 40 mg, 40 mg, Subcutaneous, Q24H, Darrell Fila, MD, 40 mg at 08/27/13 2058 feeding supplement (ENSURE COMPLETE) (ENSURE COMPLETE) liquid 237 mL, 237 mL, Oral, BID BM, Darrell Ducking, MD, 237 mL at 08/28/13 0954;  hydrochlorothiazide (HYDRODIURIL) tablet 25 mg, 25 mg, Oral, Daily, Darrell Manifold, MD, 25 mg at 08/28/13 0954;  lisinopril (PRINIVIL,ZESTRIL) tablet 20 mg, 20 mg, Oral, Daily, Darrell Ducking, MD, 20 mg at 08/28/13 0954 pantoprazole (PROTONIX) EC tablet 40 mg, 40 mg, Oral, QHS, Darrell Rack, MD, 40 mg at 08/27/13 2225;  potassium chloride (K-DUR,KLOR-CON) CR tablet 10 mEq, 10 mEq, Oral, BID, Darrell Ducking, MD, 10 mEq at 08/28/13 2542;  senna-docusate (Senokot-S) tablet 1 tablet, 1 tablet, Oral, BID, Darrell Coots, PA-C, 1 tablet at 08/28/13 7062;  traMADol (ULTRAM) tablet 50 mg, 50 mg, Oral, Q6H PRN, Early Chars Rinehuls, PA-C  Patients Current Diet: Cardiac  Precautions /  Restrictions Precautions Precautions: Fall Restrictions Weight Bearing Restrictions: No   Prior Activity Level Household: Homebound for past 1-2 weeks.  Went to [place] this past month and had to have assistance with mobility due to weakness.  Home Assistive Devices / Equipment Home Assistive Devices/Equipment: None  Prior Functional Level Prior Function Level of Independence: Independent with assistive device(s) Gait / Transfers Assistance Needed: ambulated with cane. Mod I with ADL Comments: daughter states that pt swims and loves to fish  Current Functional Level Cognition  Arousal/Alertness: Awake/alert Overall Cognitive Status: Difficult to assess Difficult to assess due to: Non-English speaking Current Attention Level: Sustained (exteranlly distracted) Orientation Level: Oriented to person;Disoriented to place;Disoriented to time;Oriented to situation (difficult neuro assessment due to language barrier) Following Commands: Follows one step commands with increased time Attention: Focused;Sustained Focused Attention: Appears intact Sustained Attention: Appears intact Awareness: Impaired Awareness Impairment: Intellectual impairment Safety/Judgment: Impaired    Extremity Assessment (includes Sensation/Coordination)  Upper Extremity Assessment: Defer to OT evaluation  Lower Extremity Assessment: RLE deficits/detail;LLE deficits/detail  RLE Deficits / Details: Noted 1/5 movement at knee but no active movement noted in hip and ankle. Lying in frog leg position on arrival. Pt with significant plantar flexion as well as external hip rotation.  LLE Deficits / Details: Ankle no active movement noted with significant plantar flexion and external rotation of foot. Knee 2/5 movement and hip 2-/5. Keeps LE in frog leg position in supine and sitting.  Cervical / Trunk Assessment: Kyphotic     ADLs  Overall ADL's : Needs assistance/impaired Eating/Feeding: Moderate  assistance;Sitting;Bed level Grooming: Total assistance;Bed level Upper Body Bathing: Total assistance;Bed level Lower Body Bathing: Total assistance;Bed level Upper Body Dressing : Total assistance;Bed level Lower Body Dressing: Total assistance;Bed level Functional mobility during ADLs:  (Needs +2) General ADL Comments: Second session focusedon teaching pt/family compensatory techniques. Tray placed over pt on L side. elbow positioned on table with towel underneathto cushion elbow. Spoon adapted with curve to bowl. used theratubing to build up handle. physical cues for bite siae. Assisted with loading utensil. Given sippy cup to loop L fingers through to bring to mouth using straw in cup. Daughter able to return demonstrate. Pt very happy about self feeding.    Mobility  Overal bed mobility: Needs Assistance;+2 for physical assistance Bed Mobility: Supine  to Sit;Sit to Supine Supine to sit: Max assist;+2 for physical assistance Sit to supine: Max assist;+2 for physical assistance General bed mobility comments: Verbal and tactile cues for technique.  Patient reaching for PT's arm to pull self up.  Instructed patient to push up with UE's - patient unable to use RUE and continued to reach with LUE.  Assist to move LE's off of bed and bring trunk to sitting position.  Once sitting, patient required max assist to maintain sitting balance.  Attempted to have patient use LUE to support weight - unable to problem-solve how to do this.  Not using RUE for support at all.  Patient sat EOB x8 minutes.  Facilitated lower trunk extension - unable to maintain.  Returned to supine with +2 assist.  Positioned LE's in neutral position with pillows on lateral aspect of knees.     Transfers  Overall transfer level:  (currently need to use lift system - maximove) General transfer comment: Unable to bear any weight on LE's.    Ambulation / Gait / Stairs / Scientist, research (physical sciences) / Balance Dynamic Sitting  Balance Sitting balance - Comments: Patient unable to maintain balance today at EOB.  Not functionally using RUE for support.  Attempting to use LUE - unable to support self.  LE's with decreased tone, feet rolled into supination/plantarflexion.    Special needs/care consideration BiPAP/CPAP No CPM No Continuous Drip IV 0.9% NS 75 ml/hr Dialysis No        Life Vest No Oxygen No Special Bed No Trach Size No Wound Vac (area) No       Skin No                               Bowel mgmt: Last BM 08/27/13 Bladder mgmt: Condom catheter in place Diabetic mgmt No    Previous Home Environment Living Arrangements: Spouse/significant other  Lives With: Spouse;Son Available Help at Discharge: Family Home Care Services: No Additional Comments: PTA, pt lived with wife and 2 yo son  Discharge Living Setting Plans for Discharge Living Setting: Patient's home;House;Lives with (comment) (Lives with wife and 90 yo son.) Type of Home at Discharge: House Discharge Home Layout: One level Discharge Home Access: Level entry (Narrow hallways and narrow door openings.) Does the patient have any problems obtaining your medications?: No  Social/Family/Support Systems Patient Roles: Spouse;Parent (Has a wife, 2 Dtrs and 2 sons.) Contact Information: Virgie Dad - daughter  Anticipated Caregiver: Daughter Anticipated Caregiver's Contact Information: [name] - dtr (h) [phone] (c) [phone] Ability/Limitations of Caregiver: Dtr not working and can assist.  Wife works days.  Yolanda Bonine here today but goes to college. Caregiver Availability: 24/7 (Family aware of need for 24/7 supervision.) Discharge Plan Discussed with Primary Caregiver: Yes Is Caregiver In Agreement with Plan?: Yes Does Caregiver/Family have Issues with Lodging/Transportation while Pt is in Rehab?: No  Goals/Additional Needs Patient/Family Goal for Rehab: PT/OT min/mod assist, ST Supervision goals Expected length of stay: 20-28  days Cultural Considerations: From [place].  Has been here 24 yrs.  Understand English, speaks some.  Is a Buddhist, but does not go to [place] Needs: Heart diet, thin liquids Equipment Needs: TBD Pt/Family Agrees to Admission and willing to participate: Yes Program Orientation Provided & Reviewed with Pt/Caregiver Including Roles  & Responsibilities: Yes  Decrease burden of Care through IP rehab admission: N/A  Possible need for SNF placement upon discharge:  Yes, if daughter does not feel she can manage level of care at the end of rehab stay.  Dtr is planning to care for patient after discharge.  Patient Condition: This patient's condition remains as documented in the consult dated 08/28/17, in which the Rehabilitation Physician determined and documented that the patient's condition is appropriate for intensive rehabilitative care in an inpatient rehabilitation facility. Will admit to inpatient rehab today.  Preadmission Screen Completed By:  Retta Diones, 08/28/2013 1:25 PM ______________________________________________________________________   Discussed status with Dr. Naaman Plummer on 08/28/17 at 1331 and received telephone approval for admission today.  Admission Coordinator:  Retta Diones, time1331/Date05/19/15

## 2013-08-28 NOTE — Discharge Summary (Signed)
Stroke Discharge Summary  Patient ID: [name]   MRN: [id number]      DOB: 03/14/1951  Date of Admission: 08/22/2013 Date of Discharge: 08/28/2013  Attending Physician:  Suzzanne Cloud, MD, Stroke MD  Consulting Physician(s):  Treatment Team:  Md Stroke, MD rehabilitation medicine  Patient's PCP:  No PCP Per Patient  Discharge Diagnoses:  Right basal ganglia hemorrhage,  Active Problems:   ICH (intracerebral hemorrhage)   Stroke BMI  Body mass index is 24.11 kg/(m^2).  Past Medical History  Diagnosis Date  . Hypertension   . Polio    History reviewed. No pertinent past surgical history.  Medications to be continued on Rehab . [START ON 08/29/2013] amLODipine  10 mg Oral Daily  . amLODipine  5 mg Oral Once  . enoxaparin (LOVENOX) injection  40 mg Subcutaneous Q24H  . feeding supplement (ENSURE COMPLETE)  237 mL Oral BID BM  . hydrochlorothiazide  25 mg Oral Daily  . lisinopril  20 mg Oral Daily  . pantoprazole  40 mg Oral QHS  . potassium chloride  10 mEq Oral BID  . senna-docusate  1 tablet Oral BID    LABORATORY STUDIES CBC    Component Value Date/Time   WBC 9.8 08/26/2013 0246   RBC 4.39 08/26/2013 0246   HGB 13.4 08/26/2013 0246   HCT 39.3 08/26/2013 0246   PLT 141* 08/26/2013 0246   MCV 89.5 08/26/2013 0246   MCH 30.5 08/26/2013 0246   MCHC 34.1 08/26/2013 0246   RDW 13.4 08/26/2013 0246   LYMPHSABS 1.4 08/26/2013 0246   MONOABS 0.8 08/26/2013 0246   EOSABS 0.2 08/26/2013 0246   BASOSABS 0.0 08/26/2013 0246   CMP    Component Value Date/Time   NA 141 08/26/2013 0246   K 3.6* 08/26/2013 0246   CL 104 08/26/2013 0246   CO2 22 08/26/2013 0246   GLUCOSE 98 08/26/2013 0246   BUN 14 08/26/2013 0246   CREATININE 0.69 08/26/2013 0246   CALCIUM 8.9 08/26/2013 0246   PROT 6.5 08/26/2013 0246   ALBUMIN 3.1* 08/26/2013 0246   AST 23 08/26/2013 0246   ALT 21 08/26/2013 0246   ALKPHOS 42 08/26/2013 0246   BILITOT 1.4* 08/26/2013 0246   GFRNONAA >90 08/26/2013 0246   GFRAA  >90 08/26/2013 0246   COAGS No results found for this basename: INR, PROTIME   Lipid Panel    Component Value Date/Time   CHOL 157 08/23/2013 2225   TRIG 168* 08/23/2013 2225   HDL 34* 08/23/2013 2225   CHOLHDL 4.6 08/23/2013 2225   VLDL 34 08/23/2013 2225   LDLCALC 89 08/23/2013 2225   HgbA1C  No results found for this basename: HGBA1C   Cardiac Panel (last 3 results) No results found for this basename: CKTOTAL, CKMB, TROPONINI, RELINDX,  in the last 72 hours Urinalysis    Component Value Date/Time   COLORURINE YELLOW 08/22/2013 Marissa 08/22/2013 1550   LABSPEC 1.014 08/22/2013 [place] 7.0 08/22/2013 1550   GLUCOSEU NEGATIVE 08/22/2013 1550   HGBUR NEGATIVE 08/22/2013 1550   BILIRUBINUR NEGATIVE 08/22/2013 1550   KETONESUR NEGATIVE 08/22/2013 1550   PROTEINUR NEGATIVE 08/22/2013 1550   UROBILINOGEN 1.0 08/22/2013 1550   NITRITE NEGATIVE 08/22/2013 1550   LEUKOCYTESUR NEGATIVE 08/22/2013 1550   Urine Drug Screen  No results found for this basename: labopia, cocainscrnur, labbenz, amphetmu, thcu, labbarb    Alcohol Level No results found for this basename: eth  SIGNIFICANT DIAGNOSTIC STUDIES Ct Head Wo Contrast  08/24/2013  1. No significant interval change in size and appearance of right basal ganglia intraparenchymal hemorrhage with associated vasogenic edema. No midline shift.  2. Grossly stable appearance of additional multi focal ischemic infarcts, better evaluated on prior brain MRI.   Ct Head Wo Contrast  08/23/2013  Slight increase in the right basal ganglia hemorrhage as described above. Atrophic changes. No other acute abnormality is noted.   Ct Head Wo Contrast  08/22/2013  Acute Intraparenchymal hemorrhage right basal ganglial.   Mr Brain Wo Contrast  08/23/2013  1. Similar appearance of right basal ganglia hemorrhage, compatible with acute hemorrhagic infarct related to history of malignant hypertension.  2. Additional multi focal ischemic  infarcts involving the bilateral parietal and temporal lobes as well as the right occipital lobe as above.  3. Remote infarcts involving the left basal ganglia/left corona radiata and right middle cerebellar peduncle.  4. Atrophy with advanced chronic microvascular ischemic disease.   2D Echocardiogram  - Left ventricle: The cavity size was normal. There was moderate concentric hypertrophy. Systolic function was vigorous. The estimated ejection fraction was 75%, in the range of 65% to 70%. Wall motion was normal; there were no regional wall motion abnormalities. Doppler parameters are consistent with abnormal left ventricular relaxation (grade 1 diastolic dysfunction). - Aortic valve: Trileaflet; normal thickness leaflets. No regurgitation. - Mitral valve: No regurgitation. - Right ventricle: Systolic function was normal. - Right atrium: The atrium was normal in size. - Tricuspid valve: No regurgitation. - Pulmonary arteries: Systolic pressure was within the normal range. - Pericardium, extracardiac: There was no pericardial effusion.  Carotid Doppler  Carotid duplex completed.  Preliminary report: Bilateral: 1-39% ICA stenosis. Vertebral artery flow is antegrade.   CXR pending   Left ankle xray 08/26/13 ; Question old trauma to the talus with remodeling. 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 No acute fracture. Mortise  appears grossly intact. No joint effusion. Bones osteoporotic.   EKG Sinus bradycardia  Probable left atrial enlargement  LVH with secondary repolarization abnormality  Anterior ST elevation, probably due to LVH   ECG - 08/24/2013 - heart rate 84 beats per minute otherwise no significant change from admission.     History of Present Illness   [name] is a 63 y.o. male who had not been feeling well for about 10 days prior to admission. He described a feeling of generalized weakness. Per daughter he was able to walk with assistance of cane but over  the few days prior to admission he had been so weak he could not walk. 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 Per daughter he takes his BP medications only when he feels ill but not when he is feeling good--same with ASA. He had not seen his primary MD since early 2014. Due to his generalized weakness he was brought to the ED. Once in the ED he was noted to have BP 240/108, 235/111, 264/140. While in the ED he was noted to be drowsy and HR dropped to 47. 0.5 mg Atropine was given and HR increased to 47. On consultation he was noted to have BP of 201/89 and nicardipine was started. He was awake and oriented and only complained of general weakness.    Hospital Course  This patient was admitted to [facility] on 08/22/2013 with a right basal ganglia intraparenchymal hemorrhage felt secondary to malignant hypertension. The patient has  a history of noncompliance with medications. He was treated with intravenous Cardene to keep his systolic blood pressure less than 180 mm mercury. He was admitted to the intensive care unit. The following day he was doing better and was transferred a medical floor. The patient's home medications were started except for his nonsteroidal anti-inflammatory drug.   While in the emergency department the patient was noted to be bradycardic and drowsy. Later in his stay his clonidine was discontinued and the bradycardia and drowsiness improved. He was placed on Norvasc instead of the clonidine. The patient has a childhood history of polio. He was experiencing some left ankle pain. The ankle was x-rayed and was negative for a fracture. He was placed on tramadol for the pain.   the patient was evaluated by the physical occupational and speech therapists. Inpatient rehabilitation was recommended. A consult was obtained from Dr. Naaman Plummer who agreed with the admission. Arrangements were made to admit the patient to the rehabilitation unit  on 08/28/2013 in stable condition.    Patient with vascular risk factors of:   Hypertension - malignant  Noncompliance with medications  History of tobacco use  Patient also with:  History of childhood polio  Intolerance to clonidine secondary to bradycardia and drowsiness.  Mild hypokalemia  Mild thrombocytopenia  Patient with resultant right hemiparesis. Physical therapy, occupational therapy and speech therapy evaluated patient. All agreed inpatient rehab is needed. Patient's family is supportive and can provide care at discharge. CIR bed is available today and patient will be transferred there.  Discharge Exam  Blood pressure 196/85, pulse 66, temperature 98 F (36.7 C), temperature source Axillary, resp. rate 20, height 5' (1.524 m), weight 123 lb 7.3 oz (56 kg), SpO2 100.00%.  Mental Status:  Alert, oriented, thought content appropriate. Speech fluent without evidence of aphasia. Able to follow simple commands without difficulty.  Cranial Nerves:  II: Discs flat bilaterally; Visual fields grossly normal, pupils equal, round, reactive to light and accommodation  III,IV, VI: ptosis not present, extra-ocular motions intact bilaterally  V,VII: smile symmetric, facial light touch sensation normal bilaterally  VIII: hearing normal bilaterally  IX,X: gag reflex present  XI: bilateral shoulder shrug weak  XII: midline tongue extension  Motor:  Right grip moderately strong, unable to flex extend elbow and left right arm off the bed. He has polio which has left him with old right sided weakness. Left grip is strong, 4/5 left bicep flexion, no triceps extension or shoulder abduction. Unable to flex bilateral hips, flex and extend knees bilaterally. (pateint keeps stating he is too weak). HE is able to plantar flex bilateral ankles and shows no DF.  Sensory: Pinprick and light touch intact throughout, bilaterally  Deep Tendon Reflexes:  Right: Upper Extremity Left: Upper extremity   biceps (C-5 to C-6) 2/4 biceps (C-5 to C-6) 2/4  tricep (C7) 2/4 triceps (C7) 2/4  Brachioradialis (C6) 2/4 Brachioradialis (C6) 2/4  Lower Extremity Lower Extremity  quadriceps (L-2 to L-4) 1/4 quadriceps (L-2 to L-4) 1/4  Achilles (S1) 2/4 Achilles (S1) 1/4  Plantars:  Right: downgoing Left: downgoing  Cerebellar:  Unable to take part in .  Gait: not tested.  CV: pulses palpable throughout   Discharge Diet  Cardiac thin liquids  Discharge Plan  Disposition:  Transfer to [place] for ongoing PT, OT and ST  No antithrombotic secondary to bleed for secondary stroke prevention.  Recommend ongoing risk factor control by Primary Care Physician at time of discharge from inpatient rehabilitation.  Risk factor recommendations:  Hypertension target range 130-140/70-80   Follow-up No PCP Per Patient in 2 weeks following discharge from rehab.  Follow-up with Dr. Antony Contras, Stroke Clinic in 2 months.  Smoking cessation  Greater than 30 minutes were spent preparing discharge.  Signed Mikey Bussing PA-C [facility] Pager [phone] 08/28/2013, 3:20 PM  I have personally examined this patient, reviewed pertinent data and developed the plan of care. I agree with above.

## 2013-08-28 NOTE — Progress Notes (Signed)
Stroke Team Progress Note  HISTORY [name] is a 63 y.o. male who had not been feeling well for about 10 days prior to admission. He described a feeling of generalized weakness. Per daughter he was able to walk with assistance of cane but over the few days prior to admission he had been so weak he could not walk. 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 Per daughter he takes his BP medications only when he feels ill but not when he is feeling good--same with ASA. He had not seen his primary MD since early 2014. Due to his generalized weakness he was brought to the ED. Once in the ED he was noted to have BP 240/108, 235/111, 264/140. While in the ED he was noted to be drowsy and HR dropped to 47. 0.5 mg Atropine was given and HR increased to 47. On consultation he was noted to have BP of 201/89 and nicardipine was started. He was awake and oriented and only complained of general weakness.   Date last known well: Date: 08/12/2013  Time last known well: Unable to determine  tPA Given: No: ICH  He was admitted to the medical ICU for further evaluation and treatment. Overnight his blood pressure had been well controlled and he has remained neurologically stable. He also has a remote history of right thalamic intracerebral hemorrhage in 2012 from which he apparently made good recovery. 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 The patient speaks limited [place]. The plan is for inpatient rehabilitation. Possibly as early as today.  OBJECTIVE Most recent Vital Signs: Filed Vitals:   08/27/13 1928 08/28/13 0133 08/28/13 0547 08/28/13 1000  BP: 172/84 169/96 169/85 169/85  Pulse: 68 67 57 60  Temp: 99.9 F (37.7 C) 97.7 F (36.5 C) 97.9 F (36.6 C) 98.3 F (36.8 C)  TempSrc: Oral Oral Oral Axillary   Resp: 22 22  20   Height:      Weight:      SpO2: 96% 98% 98% 100%   CBG (last 3)   Recent Labs  08/26/13 0735 08/26/13 1123 08/26/13 1536  GLUCAP 85 144* 90    IV Fluid Intake:   . sodium chloride 75 mL/hr at 08/27/13 1418    MEDICATIONS  . amLODipine  5 mg Oral Daily  . enoxaparin (LOVENOX) injection  40 mg Subcutaneous Q24H  . feeding supplement (ENSURE COMPLETE)  237 mL Oral BID BM  . hydrochlorothiazide  25 mg Oral Daily  . lisinopril  20 mg Oral Daily  . pantoprazole  40 mg Oral QHS  . potassium chloride  10 mEq Oral BID  . senna-docusate  1 tablet Oral BID   PRN:  acetaminophen  Diet:  Cardiac  with thin liquids. Activity:  Up with assistance DVT Prophylaxis:  SCDs  CLINICALLY SIGNIFICANT STUDIES Basic Metabolic Panel:   Recent Labs Lab 08/22/13 1150 08/26/13 0246  NA 141 141  K 3.9 3.6*  CL 102 104  CO2 24 22  GLUCOSE 112* 98  BUN 23 14  CREATININE 0.53 0.69  CALCIUM 9.5 8.9   Liver Function Tests:   Recent Labs Lab 08/26/13 0246  AST 23  ALT 21  ALKPHOS 42  BILITOT 1.4*  PROT 6.5  ALBUMIN 3.1*   CBC:   Recent Labs Lab  08/22/13 1150 08/26/13 0246  WBC 5.5 9.8  NEUTROABS  --  7.3  HGB 15.7 13.4  HCT 45.6 39.3  MCV 89.4 89.5  PLT 158 141*   Coagulation: No results found for this basename: LABPROT, INR,  in the last 168 hours Cardiac Enzymes: No results found for this basename: CKTOTAL, CKMB, CKMBINDEX, TROPONINI,  in the last 168 hours Urinalysis:   Recent Labs Lab 08/22/13 1550  COLORURINE YELLOW  LABSPEC 1.014  PHURINE 7.0  [place] 1.0  NITRITE NEGATIVE  LEUKOCYTESUR NEGATIVE   Lipid Panel    Component Value Date/Time   CHOL 157 08/23/2013 2225   HgbA1C  No results found for this basename: HGBA1C    Urine Drug Screen:   No results found for this basename: labopia,  cocainscrnur,  labbenz,  amphetmu,  thcu,   labbarb    Alcohol Level: No results found for this basename: ETH,  in the last 168 hours  Ct Head Wo Contrast 08/24/2013    1. No significant interval change in size and appearance of right basal ganglia intraparenchymal hemorrhage with associated vasogenic edema. No midline shift.  2. Grossly stable appearance of additional multi focal ischemic infarcts, better evaluated on prior brain MRI.     Ct Head Wo Contrast 08/23/2013    Slight increase in the right basal ganglia hemorrhage as described above.  Atrophic changes.  No other acute abnormality is noted.     Ct Head Wo Contrast 08/22/2013    Acute Intraparenchymal hemorrhage right basal ganglial.   Mr Brain Wo Contrast 08/23/2013    1. Similar appearance of right basal ganglia hemorrhage, compatible with acute hemorrhagic infarct related to history of malignant hypertension.  2. Additional multi focal ischemic infarcts involving the bilateral parietal and temporal lobes as well as the right occipital lobe as above.  3. Remote infarcts involving the left basal ganglia/left corona radiata and right middle cerebellar peduncle.  4. Atrophy with advanced chronic microvascular ischemic disease.     2D Echocardiogram   - Left ventricle: The cavity size was normal. There was moderate concentric hypertrophy. Systolic function was vigorous. The estimated ejection fraction was 75%, in the range of 65% to 70%. Wall motion was normal; there were no regional wall motion abnormalities. Doppler parameters are consistent with abnormal left ventricular relaxation (grade 1 diastolic dysfunction). - Aortic valve: Trileaflet; normal thickness leaflets. No regurgitation. - Mitral valve: No regurgitation. - Right ventricle: Systolic function was normal. - Right atrium: The atrium was normal in size. - Tricuspid valve: No regurgitation. - Pulmonary arteries: Systolic pressure was within the normal range. - Pericardium, extracardiac: There was no  pericardial effusion.  Carotid Doppler   Carotid duplex completed.  Preliminary report: Bilateral: 1-39% ICA stenosis. Vertebral artery flow is antegrade.   CXR  pending Left ankle xray 08/26/13 ; Question old trauma to the talus with remodeling. 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 No acute fracture. Mortise  appears grossly intact. No joint effusion. Bones osteoporotic.   EKG Sinus bradycardia Probable left atrial enlargement LVH with secondary repolarization abnormality Anterior ST elevation, probably due to LVH   ECG - 08/24/2013 - heart rate 84 beats per minute otherwise no significant change from admission.  Therapy Recommendations CIR/24 hr assistance  Physical Exam General: The patient is alert, cooperative. Respiratory: clear lung fields Cardiovascular: regular rate, 75 Abdomen: Soft, nontender Skin: No significant peripheral edema is noted.  Left ankle movements painful. Bilateral ankle and wrist deformities from polio  Neurologic Exam Mental status: The patient is alert and fully cooperative. Cranial nerves: Facial symmetry is present. Speech is dysarthric, difficult to understand. Extraocular movements are full. Visual fields are full. Motor: The patient has good effort with the left arm, left deltoid weakness is seen. right arm is almost flaccid, some flexion at the elbow is noted. Legs are externally rotated, minimal voluntary movement is seen with either side. Left leg is in flexion. The left ankle is painful to move, slightly warm. Sensory examination: Symmetric to soft touch. Coordination: The patient cannot perform heal to shin on either side, cannot perform finger nose finger with the right arm and has difficulty with the left arm due to deltoid weakness. Gait and station: The gait could not be tested. Reflexes: Deep tendon reflexes are symmetric, depressed, toes are down bilaterally.   ASSESSMENT Mr. [name] is a 63 y.o. male presenting with  generalized weakness x 10 days. Imaging confirms a  right basal ganglia hemorrhage with malignant hypertension. On no antithrombotics prior to admission. Now on no anticoagulants for secondary stroke prevention due to hemorrhagic stroke. Patient with resultant  paraparesis and left UE weanesss. Stroke work up underway.   Malignant HT  CHOLESTEROL 168  - LDL 89  Childhood Polio  History of noncompliance with medications.  Bradycardia and drowsiness resolved with discontinuation of clonidine.   Hospital day # 6  TREATMENT/PLAN  Home medications restarted.  Hold NSAID for now.  X-ray left ankle - no fracture  Watch blood pressure - may need further treatment. Consider increasing amlodipine to 10 mg daily if needed.  No antithrombotic secondary to hemorrhagic stroke.  Risk factor modification - encourage compliance with medications.  Tramadol added prn for pain.  Pt for CIR today.    Mikey Bussing PA-C [facility] Pager [phone] 08/28/2013, 10:07 AM  I have personally obtained a history, examined the patient, evaluated imaging results, and formulated the assessment and plan of care. I agree with the above.   Jim Like, DO [facility] Pager: [phone]  To contact Stroke Continuity provider, please refer to [email or web address] After hours, contact General Neurology

## 2013-08-28 NOTE — H&P (Signed)
Physical Medicine and Rehabilitation Admission H&P    Chief Complaint  Patient presents with  . Paraparesis, LUE weakness in patient with postpolio syndrome    HPI: [name] is a 63 y.o. 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 CT head with acute Intraparenchymal hemorrhage right basal ganglia. He had increase in lethargy on 05/14 and follow up CCT with additional multifocal ischemic infarcts involving bilateral parietal and temporal lobes as well as right occipital lobe. 2D echo with EF 65-70% with no wall abnormality. Carotid dopplers without ICA stenosis. Patient with resultant paraparesis as well as LUE weakness, apraxia, decreased verbal output with naming errors as well as difficulty with multistep commands. CIR recommended by rehab team and patient admitted today.    Review of Systems  Respiratory: Positive for cough.        Runny nose  Musculoskeletal: Positive for joint pain (left ankle> right ankle pain.) and myalgias.  Neurological: Positive for speech change.    Past Medical History  Diagnosis Date  . Hypertension   . Polio    History reviewed. No pertinent past surgical history. Family History  Problem Relation Age of Onset  . Hypertension Mother   . Hypertension Father    Social History: Lives with wife and family. Has been in [place] for 20+ years? Wife works at nights? He reports that he has been smoking Cigarettes--< 1 PPD. He has been smoking about 0.00 packs per day. He does not have any smokeless tobacco history on file. Per reports that he does not drink alcohol or use illicit drugs.    Allergies: No Known Allergies   Medications Prior to Admission  Medication Sig Dispense  Refill  . cloNIDine (CATAPRES) 0.2 MG tablet Take 0.2 mg by mouth 2 (two) times daily.      . hydrochlorothiazide (HYDRODIURIL) 25 MG tablet Take 25 mg by mouth daily.      Marland Kitchen lisinopril (PRINIVIL,ZESTRIL) 10 MG tablet Take 10 mg by mouth daily.      . naproxen (NAPROSYN) 500 MG tablet Take 500 mg by mouth 2 (two) times daily with a meal.        Home: Home Living Family/patient expects to be discharged to:: Inpatient rehab Living Arrangements: Spouse/significant other Available Help at Discharge: Family Additional Comments: PTA, pt lived with wife and 19 yo son  Lives With: Spouse;Son   Functional History: Prior Function Level of Independence: Independent with assistive device(s) Gait / Transfers Assistance Needed: ambulated with cane. Mod I with ADL Comments: daughter states that pt swims and loves to fish  Functional Status:  Mobility: Bed Mobility Overal bed mobility: Needs Assistance;+2 for physical assistance Bed Mobility: Supine to Sit;Sit to Supine Supine to sit: Max assist;+2 for physical assistance Sit to supine: Max assist;+2 for physical assistance General bed mobility comments: Verbal and tactile cues for technique.  Patient reaching for PT's arm to pull self up.  Instructed patient to push up with UE's - patient unable to use RUE and continued to reach with LUE.  Assist to move LE's off of bed and bring trunk to sitting position.  Once sitting, patient required max assist to maintain sitting balance.  Attempted to have patient use LUE to support  weight - unable to problem-solve how to do this.  Not using RUE for support at all.  Patient sat EOB x8 minutes.  Facilitated lower trunk extension - unable to maintain.  Returned to supine with +2 assist.  Positioned LE's in neutral position with pillows on lateral aspect of knees.  Transfers Overall transfer level:  (currently need to use lift system - maximove) General transfer comment: Unable to bear any weight on LE's.       ADL: ADL Overall ADL's : Needs assistance/impaired Eating/Feeding: Moderate assistance;Sitting;Bed level Grooming: Total assistance;Bed level Upper Body Bathing: Total assistance;Bed level Lower Body Bathing: Total assistance;Bed level Upper Body Dressing : Total assistance;Bed level Lower Body Dressing: Total assistance;Bed level Functional mobility during ADLs:  (Needs +2) General ADL Comments: Second session focusedon teaching pt/family compensatory techniques. Tray placed over pt on L side. elbow positioned on table with towel underneathto cushion elbow. Spoon adapted with curve to bowl. used theratubing to build up handle. physical cues for bite siae. Assisted with loading utensil. Given sippy cup to loop L fingers through to bring to mouth using straw in cup. Daughter able to return demonstrate. Pt very happy about self feeding.  Cognition: Cognition Overall Cognitive Status: Difficult to assess Arousal/Alertness: Awake/alert Orientation Level: Oriented to person;Disoriented to place;Disoriented to time;Oriented to situation (difficult neuro assessment due to language barrier) Attention: Focused;Sustained Focused Attention: Appears intact Sustained Attention: Appears intact Awareness: Impaired Awareness Impairment: Intellectual impairment Safety/Judgment: Impaired Cognition Arousal/Alertness: Awake/alert Behavior During Therapy: Flat affect Overall Cognitive Status: Difficult to assess Area of Impairment: Following commands;Problem solving Orientation Level: Disoriented to;Time Current Attention Level: Sustained (exteranlly distracted) Following Commands: Follows one step commands with increased time Awareness: Emergent Problem Solving: Slow processing;Decreased initiation;Difficulty sequencing;Requires verbal cues Difficult to assess due to: Non-English speaking   Blood pressure 169/85, pulse 60, temperature 98.3 F (36.8 C), temperature source Axillary, resp. rate 20,  height 5' (1.524 m), weight 56 kg (123 lb 7.3 oz), SpO2 100.00%. Physical Exam Nursing note and vitals reviewed.  Constitutional: He has a sickly appearance.  Thin, ill appearing male with watery eyes with runny nose noted.  HENT: oral mucosa pink/moist Head: Normocephalic and atraumatic.  Eyes: Pupils are equal, round, and reactive to light.  Neck: Normal range of motion. Neck supple.  Cardiovascular: Normal rate and regular rhythm. No murmurs Respiratory: Effort normal and breath sounds normal. No wheezes GI: Soft. Bowel sounds are normal. He exhibits no distension.  Musculoskeletal:  Numerous joint deformities of hands/feet, both shoulders subluxed, right more than left. Had pain with PROM of numerous joints and tolerated limited movement today. Neurological: He is alert.  Right facial paresis. Patient was able to answer occasional biographic questions but expressive deficits as well as tendency to revert to [place]. BLE and RUE paresis noted. Was able to extend fingers on right hand with minimal assistance. Kept BLE flexed in frog leg position and had pain with attempts at extension of bilateral knees < feet. . Multiple deformities BLE and bilateral hands noted.  Skin: Skin is warm and dry. Rash (Fine macular rash noted on bilateral thighs and RUE) noted.  Psychiatric: He has a normal mood and affect. His behavior is normal. Judgment and thought content normal.     Results for orders placed during the hospital encounter of 08/22/13 (from the past 48 hour(s))  GLUCOSE, CAPILLARY     Status: None   Collection Time    08/26/13  3:36 PM      Result Value Ref Range  Glucose-Capillary 90  70 - 99 mg/dL   Dg Chest 2 View  08/27/2013   CLINICAL DATA:  Stroke.  EXAM: CHEST  2 VIEW  COMPARISON:  Chest CT 07/21/2005.  FINDINGS: Patchy bilateral pulmonary infiltrates are noted most consistent with pneumonia, possibly related to aspiration. Interstitial pulmonary edema cannot be excluded.  Cardiomegaly is present with normal pulmonary vascularity. No pleural effusion or pneumothorax.  IMPRESSION: Patchy pulmonary infiltrates noted bilaterally most consistent with pneumonia.   Electronically Signed   By: Marcello Moores  Register   On: 08/27/2013 12:21       Medical Problem List and Plan: 1. Functional deficits secondary to [place], embolic infarcts in a patient with post-polio syndrome 2.  DVT Prophylaxis/Anticoagulation: Pharmaceutical: Lovenox 3. Pain Management:  Used naprosyn bid at home. May need to schedule tylenol--monitor for now.   4. Mood:  Provide ego support. LCSW to follow for evaluation and support.  5. Neuropsych: This patient is is not capable of making decisions on his own behalf. 6. HTN: Will monitor BP every 8 hours and adjust medication as needed for tighter control. Continue lisinopril and HCTZ 7. Bilateral patchy infiltrates/Question aspiration PNA: Will start Levaquin for treatment. Monitor for temp curve for elevation and check WBC in am.  8. 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 Routine pressure relief measures-- will order air mattress overlay.      Post Admission Physician Evaluation: 1. Functional deficits secondary  to [place], embolic infarcts in a patient with post-polio syndrome 2. Patient is admitted to receive collaborative, interdisciplinary care between the physiatrist, rehab nursing staff, and therapy team. 3. Patient's level of medical complexity and substantial therapy needs in context of that medical necessity cannot be provided at a lesser intensity of care such as a SNF. 4. Patient has experienced substantial functional loss from his/her baseline which was documented above under the "Functional History" and "Functional Status" headings.  Judging by the patient's diagnosis, physical exam, and functional history, the patient has potential for functional progress which will result in  measurable gains while on inpatient rehab.  These gains will be of substantial and practical use upon discharge  in facilitating mobility and self-care at the household level. 5. Physiatrist will provide 24 hour management of medical needs as well as oversight of the therapy plan/treatment and provide guidance as appropriate regarding the interaction of the two. 6. 24 hour rehab nursing will assist with bladder management, bowel management, safety, skin/wound care, disease management, medication administration, pain management and patient education  and help integrate therapy concepts, techniques,education, etc. 7. PT will assess and treat for/with: Lower extremity strength, range of motion, stamina, balance, functional mobility, safety, adaptive techniques and equipment, joint management, contracture control, splinting, pain control, education.   Goals are: min to mod assist. 8. OT will assess and treat for/with: ADL's, functional mobility, safety, upper extremity strength, adaptive techniques and equipment, joint/contracture control, pain mgt, family education, splinting.   Goals are: mod assist. 9. SLP will assess and treat for/with: cognition, communication, swallowing.  Goals are: supervision to min assist. 10. Case Management and Social Worker will assess and treat for psychological issues and discharge planning. 11. Team conference will be held weekly to assess progress toward goals and to determine barriers to discharge. 12. Patient will receive at least 3 hours of therapy per day at least 5 days per week. 13. ELOS: 15-22 days depending upon progress       14. Prognosis:  excellent     Meredith Staggers, MD, [facility]   08/28/2013

## 2013-08-28 NOTE — Interval H&P Note (Signed)
Phillipe Clemon was admitted today to Inpatient Rehabilitation with the diagnosis of embolic CVA/ ICH.  The patient's history has been reviewed, patient examined, and there is no change in status.  Patient continues to be appropriate for intensive inpatient rehabilitation.  I have reviewed the patient's chart and labs.  Questions were answered to the patient's satisfaction.  Meredith Staggers 08/28/2013, 9:55 PM

## 2013-08-29 ENCOUNTER — Inpatient Hospital Stay (HOSPITAL_COMMUNITY): Payer: Medicare Other | Admitting: Occupational Therapy

## 2013-08-29 ENCOUNTER — Inpatient Hospital Stay (HOSPITAL_COMMUNITY): Payer: Medicare Other | Admitting: Physical Therapy

## 2013-08-29 ENCOUNTER — Inpatient Hospital Stay (HOSPITAL_COMMUNITY): Payer: Medicare Other

## 2013-08-29 ENCOUNTER — Inpatient Hospital Stay (HOSPITAL_COMMUNITY): Payer: Medicare Other | Admitting: Speech Pathology

## 2013-08-29 DIAGNOSIS — R918 Other nonspecific abnormal finding of lung field: Secondary | ICD-10-CM | POA: Diagnosis not present

## 2013-08-29 DIAGNOSIS — G14 Postpolio syndrome: Secondary | ICD-10-CM

## 2013-08-29 DIAGNOSIS — I619 Nontraumatic intracerebral hemorrhage, unspecified: Secondary | ICD-10-CM

## 2013-08-29 DIAGNOSIS — I10 Essential (primary) hypertension: Secondary | ICD-10-CM | POA: Diagnosis present

## 2013-08-29 LAB — CBC WITH DIFFERENTIAL/PLATELET
Basophils Absolute: 0 10*3/uL (ref 0.0–0.1)
Basophils Relative: 0 % (ref 0–1)
Eosinophils Absolute: 0.2 10*3/uL (ref 0.0–0.7)
Eosinophils Relative: 3 % (ref 0–5)
HCT: 40.1 % (ref 39.0–52.0)
Hemoglobin: 13.8 g/dL (ref 13.0–17.0)
LYMPHS ABS: 1.4 10*3/uL (ref 0.7–4.0)
LYMPHS PCT: 18 % (ref 12–46)
MCH: 30.7 pg (ref 26.0–34.0)
MCHC: 34.4 g/dL (ref 30.0–36.0)
MCV: 89.3 fL (ref 78.0–100.0)
Monocytes Absolute: 0.8 10*3/uL (ref 0.1–1.0)
Monocytes Relative: 11 % (ref 3–12)
Neutro Abs: 5.1 10*3/uL (ref 1.7–7.7)
Neutrophils Relative %: 68 % (ref 43–77)
PLATELETS: 177 10*3/uL (ref 150–400)
RBC: 4.49 MIL/uL (ref 4.22–5.81)
RDW: 13.2 % (ref 11.5–15.5)
WBC: 7.5 10*3/uL (ref 4.0–10.5)

## 2013-08-29 LAB — COMPREHENSIVE METABOLIC PANEL
ALBUMIN: 2.9 g/dL — AB (ref 3.5–5.2)
ALT: 31 U/L (ref 0–53)
AST: 29 U/L (ref 0–37)
Alkaline Phosphatase: 52 U/L (ref 39–117)
BUN: 19 mg/dL (ref 6–23)
CO2: 23 meq/L (ref 19–32)
CREATININE: 0.76 mg/dL (ref 0.50–1.35)
Calcium: 9.3 mg/dL (ref 8.4–10.5)
Chloride: 104 mEq/L (ref 96–112)
GFR calc Af Amer: >90 mL/min (ref >90)
Glucose, Bld: 106 mg/dL — ABNORMAL HIGH (ref 70–99)
Potassium: 4.6 mEq/L (ref 3.7–5.3)
Sodium: 140 mEq/L (ref 137–147)
Total Bilirubin: 0.9 mg/dL (ref 0.3–1.2)
Total Protein: 7.2 g/dL (ref 6.0–8.3)

## 2013-08-29 MED ORDER — BENEPROTEIN PO POWD
1.0000 | Freq: Three times a day (TID) | ORAL | Status: DC
  Administered 2013-08-31 – 2013-09-14 (×40): 6 g via ORAL
  Filled 2013-08-29: qty 227

## 2013-08-29 NOTE — Evaluation (Signed)
Physical Therapy Assessment and Plan  Patient Details  Name: [name] MRN: [id number] Date of Birth: 06-Dec-1950  PT Diagnosis: Abnormal posture, Abnormality of gait, Cognitive deficits, Difficulty walking, Hemiplegia dominant, Hypotonia, Muscle weakness and Paraplegia Rehab Potential: Fair ELOS: 18-21 days   Today's Date: 08/29/2013 Time: 0830-0925 Time Calculation (min): 55 min  Problem List:  Patient Active Problem List   Diagnosis Date Noted  . HTN (hypertension) 08/29/2013  . 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 Acute embolic stroke 63/52/7782  . ICH (intracerebral hemorrhage) 08/22/2013  . Stroke 08/22/2013    Past Medical History:  Past Medical History  Diagnosis Date  . Hypertension   . Polio    Past Surgical History: No past surgical history on file.  Assessment & Plan Clinical Impression: Patient is a 63 y.o. 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 CT head with acute Intraparenchymal hemorrhage right basal ganglia. He had increase in lethargy on 05/14 and follow up CCT with additional multifocal ischemic infarcts involving bilateral parietal and temporal lobes as well as right occipital lobe. 2D echo with EF 65-70% with no wall abnormality. Carotid dopplers without ICA stenosis.   Patient currently requires total with mobility secondary to muscle weakness and muscle paralysis, decreased cardiorespiratoy endurance, motor apraxia, decreased motor planning and hypotonia, decreased midline orientation, decreased attention, decreased awareness and decreased problem solving and decreased sitting balance, decreased standing balance, decreased postural control, hemiplegia and decreased  balance strategies.  Prior to hospitalization, patient was min with mobility with SPC (per pt report, needs to be confirmed with family) and lived with Spouse in a House home.  Home access is  Other (comment) (need to confirm with family; pt unable to report).  Patient will benefit from skilled PT intervention to maximize safe functional mobility, minimize fall risk and decrease caregiver burden for planned discharge home with 24 hour assist.  Anticipate patient will benefit from follow up [facility] at discharge.  PT - End of Session Activity Tolerance: Decreased this session Endurance Deficit: Yes Endurance Deficit Description: impaired endurance, anxiety PT Assessment Rehab Potential: Fair Barriers to Discharge: Decreased caregiver support Barriers to Discharge Comments: Pt will require significant hands on assistance PT Patient demonstrates impairments in the following area(s): Balance;Endurance;Motor;Perception PT Transfers Functional Problem(s): Bed Mobility;Bed to Chair;Car PT Locomotion Functional Problem(s): Ambulation PT Plan PT Intensity: Minimum of 1-2 x/day ,45 to 90 minutes PT Frequency: 5 out of 7 days PT Duration Estimated Length of Stay: 18-21 days PT Treatment/Interventions: Ambulation/gait training;Balance/vestibular training;Cognitive remediation/compensation;Discharge planning;Disease management/prevention;DME/adaptive equipment instruction;Functional mobility training;Neuromuscular re-education;Patient/family education;Skin care/wound management;Splinting/orthotics;Therapeutic Activities;Therapeutic Exercise;UE/LE Strength taining/ROM;Visual/perceptual remediation/compensation;UE/LE Coordination activities;Psychosocial support PT Transfers Anticipated Outcome(s): mod A PT Locomotion Anticipated Outcome(s): max A with therapy only; pt w/c level for home PT Recommendation Follow Up Recommendations: Home health PT;24 hour supervision/assistance Patient destination: Home (vs SNF if  24 assistance not available) Equipment Recommended: Rolling walker with 5" wheels;Wheelchair (measurements);Wheelchair cushion (measurements);To be determined  Skilled Therapeutic Intervention Seated EOB pt vitals assessed and elevated BP and reports of dizziness reported to RN.  Attempted to engage pt in postural control, sitting balance training with reaching but secondary to shoulder weakness and anxiety pt unable to reach for target.  OT present for B&D.  OT wishing  to perform B&D in bed; transferred back to supine with +2 A.  PT Evaluation Precautions/Restrictions Precautions Precautions: Fall Precaution Comments: For change in neurologic condition, patient c/o severe headache, or nausea and vomiting develops.  SBP > 160 (not controlled by prn medications) or less than 100, DBP > 110 or less than 50, Temperature > 100.5 F, Heart rate > 120 or < 50, blood glucose > 180 or < 60 mg/dl; Air mattress--SR x 4, bilat PREVALON boots in bed Restrictions Weight Bearing Restrictions: No General Chart Reviewed: Yes Response to Previous Treatment: Not applicable Family/Caregiver Present: No  Vital SignsTherapy Vitals BP: 173/90 mmHg Pain Pain Assessment Pain Assessment: No/denies pain Faces Pain Scale: No hurt Home Living/Prior Functioning Home Living Available Help at Discharge: Family;Other (Comment) (Wife works) Type of Home: House Home Access: Other (comment) (need to confirm with family; pt unable to report) Home Layout: One level Additional Comments: PTA, pt lived with wife and 28 yo son  Lives With: Spouse Prior Function Level of Independence: Independent with transfers;Requires assistive device for independence;Needs assistance with gait  Able to Take Stairs?: No Driving: No Vocation: Unemployed Cognition Overall Cognitive Status: Difficult to assess Arousal/Alertness: Awake/alert Orientation Level: Oriented to person;Oriented to place;Oriented to situation Attention:  Sustained;Focused Focused Attention: Appears intact Sustained Attention: Appears intact Awareness: Impaired Awareness Impairment: Intellectual impairment Comments: No family present to translate or determine baseline cognition.  Pt attempts to follow and initiate one step commands related to bed mobility and selfcare tasks without difficulty.   Pt unable to state if his weakness in the RUE is new or if he was like this previously.   Sensation Sensation Light Touch: Appears Intact (Simultaneous filing. User may not have seen previous data.) Stereognosis: Not tested (Simultaneous filing. User may not have seen previous data.) Hot/Cold: Not tested (Simultaneous filing. User may not have seen previous data.) Proprioception: Not tested Coordination Gross Motor Movements are Fluid and Coordinated: No (Simultaneous filing. User may not have seen previous data.) Fine Motor Movements are Fluid and Coordinated: No Coordination and Movement Description: Pt with bilateral limited gross and FM coordination with the RUE being more impaired than the left.  He was able to hold the washcloth with the left UE but not the right.  Limited bilateral shoulder flexion for selfcare tasks. (Simultaneous filing. User may not have seen previous data.) Motor  Motor Motor: Paraplegia;Hemiplegia;Abnormal postural alignment and control;Motor apraxia Motor - Skilled Clinical Observations: R UE and bilat LE weakness secondary to polio; L shoulder weakness from CVA? vs. overuse; impaired motor planning transitional movement, in sitting pt falls posterior and L with inability to self correct  Mobility Bed Mobility Bed Mobility: Sit to Supine;Supine to Sit Supine to Sit: 1: +2 Total assist Supine to Sit Details: Verbal cues for technique;Manual facilitation for weight shifting;Manual facilitation for placement Supine to Sit Details (indicate cue type and reason): Pt able to initiate supine >sit with cues but pt required total  A to complete rolling and side > sit Sit to Supine: 1: +2 Total assist Sit to Supine - Details: Manual facilitation for weight shifting;Verbal cues for technique;Manual facilitation for placement Sit to Supine - Details (indicate cue type and reason): +2 A to return from sitting EOB to supine for B&D Transfers Transfers: Yes Sit to Stand: 1: +2 Total assist;From elevated surface;With upper extremity assist Sit to Stand Details (indicate cue type and reason): Attempted sit > stand from elevated bed x 2 reps with +2 A for UE support and to  block knees; pt unable both attemptes to come to full stand; pt very fearful of anterior lean  Locomotion  Ambulation Ambulation: No Stairs / Additional Locomotion Stairs: No Wheelchair Mobility Wheelchair Mobility: No  Trunk/Postural Assessment  Cervical Assessment Cervical Assessment: Within Functional Limits (Simultaneous filing. User may not have seen previous data.) Thoracic Assessment Thoracic Assessment: Exceptions to [facility] (slight kyphosis, significant posterior/L lateral LOB Simultaneous filing. User may not have seen previous data.) Thoracic Strength Overall Thoracic Strength Comments: Pt demonstrates thoracic kyphosis in sitting. Lumbar Assessment Lumbar Assessment: Exceptions to [facility] (decreased lordosis) Postural Control Postural Control: Deficits on evaluation (absent trunk control for self correction of LOB or excursion out of BOS)  Balance Balance Balance Assessed: Yes Static Sitting Balance Static Sitting - Balance Support: Right upper extremity supported;Left upper extremity supported;Feet supported Static Sitting - Level of Assistance: 2: Max assist;1: +1 Total assist Static Sitting - Comment/# of Minutes: Pt with increased LOB posteriorly in sitting EOB. Dynamic Sitting Balance Dynamic Sitting - Balance Support: Right upper extremity supported;Left upper extremity supported;Feet supported Dynamic Sitting - Level of Assistance: 1: +1  Total assist Dynamic Sitting - Balance Activities: Lateral lean/weight shifting;Reaching for objects;Forward lean/weight shifting Sitting balance - Comments: Attempted to have pt reach for items in front of him to encourage anterior lean/weight shift.  Pt very fearful and lacked sufficient trunk control for excursion out of BOS Extremity Assessment  RUE Assessment RUE Assessment: Exceptions to [facility] RUE Strength RUE Overall Strength Comments: right UE with history of Polio per chart.  PROM WFLs for all joints but AROM shoulder flexion absent.  Elbow flexion and extension 2-/5.  Pt maintains flexed digits at rest but can demonstrate active digit extension slightly in the 4th and 5th digits but very minimal in the 2nd and 3rd as well as the thumb.   LUE Assessment LUE Assessment: Exceptions to [facility] LUE Strength LUE Overall Strength Comments: AAROM shoulder flexion WFLS, 2-/5 with AROM,  elbow flexion 4/5, elbow extension 3/5, grasp and release 4/5.  Decreased FM coordination with finger to thumb however.  Used functionally for selfcare tasks including brushing his teeth and washing his face.   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  In bed bilat LE rest in fully ER position  FIM:  FIM - Bed/Chair Transfer Bed/Chair Transfer Assistive Devices: Bed rails Bed/Chair Transfer: 1: Supine > Sit: Total A (helper does all/Pt. < 25%);1: Sit > Supine: Total A (helper does all/Pt. < 25%) FIM - Locomotion: Wheelchair Locomotion: Wheelchair: 0: Activity did not occur FIM - Locomotion:  Ambulation Locomotion: Ambulation: 0: Activity did not occur FIM - Locomotion: Stairs Locomotion: Stairs: 0: Activity did not occur   Refer to Care Plan for Long Term Goals  Recommendations for other services: None  Discharge Criteria: Patient will be discharged from PT if patient refuses treatment 3 consecutive times without medical reason, if treatment goals not met, if there is a change in medical status, if patient makes no progress towards goals or if patient is discharged from hospital.  The above assessment, treatment plan, treatment alternatives and goals were discussed and mutually agreed upon: by patient  [place]  Faucette Hall 08/29/2013, 12:56 PM

## 2013-08-29 NOTE — Progress Notes (Signed)
Occupational Therapy Session Note  Patient Details  Name: [name] MRN: [id number] Date of Birth: [birth year]  Today's Date: 08/29/2013 Time: 1300-1330 Time Calculation (min): 30 min  Skilled Therapeutic Interventions/Progress Updates:   During session gave pt cup with handle to use for self drinking task.  He was able to drink using the RUE with and without the straw.  Pt reported having assistance with eating per his report but unsure secondary to language deficit.  Second part of session had pt focus on toilet transfers to the tub/commode bench with total assist using the sliding board.  Pt with decreased forward trunk flexion to assist with transfer and limited use of the UEs.    Therapy Documentation Precautions:  Precautions Precautions: Fall Precaution Comments: For change in neurologic condition, patient c/o severe headache, or nausea and vomiting develops.  SBP > 160 (not controlled by prn medications) or less than 100, DBP > 110 or less than 50, Temperature > 100.5 F, Heart rate > 120 or < 50, blood glucose > 180 or < 60 mg/dl; Air mattress--SR x 4, bilat PREVALON boots in bed Restrictions Weight Bearing Restrictions: No  Vital Signs: Therapy Vitals Temp: 98.1 F (36.7 C) Temp src: Oral Pulse Rate: 68 Resp: 16 BP: 161/82 mmHg Patient Position (if appropriate): Sitting Oxygen Therapy SpO2: 100 % O2 Device: None (Room air) Pain: Pain Assessment Pain Assessment: 0-10 Pain Score:  (Patient unable to rate, states there is pain in left ankle) Pain Type: Acute pain Pain Location: Ankle Pain Orientation: Left Pain Onset: With Activity Pain Intervention(s): Repositioned;Emotional support;Medication (See eMAR) Multiple Pain Sites: No ADL: See FIM for current functional status  Therapy/Group: Individual Therapy  Cindra Presume OTR/L 08/29/2013, 3:56 PM

## 2013-08-29 NOTE — Progress Notes (Signed)
Physical Medicine and Rehabilitation Consult  Reason for Consult: Paraparesis, LUE weakness in patient with postpolio syndrome.  Referring Physician: Dr. Leonie Man  HPI: [name] is a 63 y.o. 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 CT head with acute Intraparenchymal hemorrhage right basal ganglia. He had increase in lethargy on 05/14 and follow up CCT with additional multifocal ischemic infarcts involving bilateral parietal and temporal lobes as well as right occipital lobe. 2D echo with EF 65-70% with no wall abnormality. Carotid dopplers without ICA stenosis. Patient with resultant paraparesis as well as LUE weakness, apraxia, decreased verbal output with naming errors as well as difficulty with multistep commands. CIR recommended by rehab team.  Review of Systems  Unable to perform ROS  Respiratory: Positive for cough.  Runny nose   Past Medical History   Diagnosis  Date   .  Hypertension    .  Polio     History reviewed. No pertinent past surgical history.  Family History   Problem  Relation  Age of Onset   .  Hypertension  Mother    .  Hypertension  Father     Social History: Lives with wife and family. Has been in [place] for 20+ years? Wife works at nights? He reports that he has been smoking Cigarettes--< 1 PPD. He has been smoking about 0.00 packs per day. He does not have any smokeless tobacco history on file. Per reports that he does not drink alcohol or use illicit drugs.  Allergies: No Known Allergies  Medications Prior to Admission   Medication  Sig  Dispense  Refill   .  cloNIDine (CATAPRES) 0.2 MG tablet  Take 0.2 mg by mouth 2 (two) times daily.     .  hydrochlorothiazide (HYDRODIURIL) 25 MG tablet   Take 25 mg by mouth daily.     Marland Kitchen  lisinopril (PRINIVIL,ZESTRIL) 10 MG tablet  Take 10 mg by mouth daily.     .  naproxen (NAPROSYN) 500 MG tablet  Take 500 mg by mouth 2 (two) times daily with a meal.      Home:  Home Living  Family/patient expects to be discharged to:: Inpatient rehab  Living Arrangements: Spouse/significant other  Available Help at Discharge: Family  Additional Comments: PTA, pt lived with wife and 47 yo son  Lives With: Spouse;Son  Functional History:  Prior Function  Level of Independence: Independent with assistive device(s)  Gait / Transfers Assistance Needed: ambulated with cane. Mod I with ADL  Comments: daughter states that pt swims and loves to fish  Functional Status:  Mobility:  Bed Mobility  Overal bed mobility: Needs Assistance;+2 for physical assistance  Bed Mobility: Supine to Sit;Sit to Supine  Supine to sit: Max assist;+2 for physical assistance  Sit to supine: Max assist;+2 for physical assistance  General bed mobility comments: Verbal and tactile cues for technique. Patient reaching for PT's arm to pull self up. Instructed patient to push up with UE's - patient unable to use RUE and continued to reach with LUE. Assist to move LE's off of bed and bring trunk to sitting position. Once sitting, patient required max assist to maintain sitting balance. Attempted to have patient use LUE to support weight -  unable to problem-solve how to do this. Not using RUE for support at all. Patient sat EOB x8 minutes. Facilitated lower trunk extension - unable to maintain. Returned to supine with +2 assist. Positioned LE's in neutral position with pillows on lateral aspect of knees.  Transfers  Overall transfer level: (currently need to use lift system - maximove)  General transfer comment: Unable to bear any weight on LE's.    ADL:  ADL  Overall ADL's : Needs assistance/impaired  Eating/Feeding: Moderate assistance;Sitting;Bed level  Grooming: Total assistance;Bed  level  Upper Body Bathing: Total assistance;Bed level  Lower Body Bathing: Total assistance;Bed level  Upper Body Dressing : Total assistance;Bed level  Lower Body Dressing: Total assistance;Bed level  Functional mobility during ADLs: (Needs +2)  General ADL Comments: Second session focusedon teaching pt/family compensatory techniques. Tray placed over pt on L side. elbow positioned on table with towel underneathto cushion elbow. Spoon adapted with curve to bowl. used theratubing to build up handle. physical cues for bite siae. Assisted with loading utensil. Given sippy cup to loop L fingers through to bring to mouth using straw in cup. Daughter able to return demonstrate. Pt very happy about self feeding.  Cognition:  Cognition  Overall Cognitive Status: Difficult to assess  Arousal/Alertness: Awake/alert  Orientation Level: Oriented to person;Oriented to place;Oriented to time;Disoriented to situation (difficult neuro assessment due to language barrier)  Attention: Focused;Sustained  Focused Attention: Appears intact  Sustained Attention: Appears intact  Awareness: Impaired  Awareness Impairment: Intellectual impairment  Safety/Judgment: Impaired  Cognition  Arousal/Alertness: Awake/alert  Behavior During Therapy: Flat affect  Overall Cognitive Status: Difficult to assess  Area of Impairment: Following commands;Problem solving  Orientation Level: Disoriented to;Time  Current Attention Level: Sustained (exteranlly distracted)  Following Commands: Follows one step commands with increased time  Awareness: Emergent  Problem Solving: Slow processing;Decreased initiation;Difficulty sequencing;Requires verbal cues  Difficult to assess due to: Non-English speaking  Blood pressure 169/85, pulse 57, temperature 97.9 F (36.6 C), temperature source Oral, resp. rate 22, height 5' (1.524 m), weight 56 kg (123 lb 7.3 oz), SpO2 98.00%.  Physical Exam  Nursing note and vitals reviewed.   Constitutional: He has a sickly appearance.  Thin, ill appearing male with watery eyes with runny nose noted.  HENT:  Head: Normocephalic and atraumatic.  Eyes: Pupils are equal, round, and reactive to light.  Neck: Normal range of motion. Neck supple.  Cardiovascular: Normal rate and regular rhythm.  Respiratory: Effort normal and breath sounds normal.  GI: Soft. Bowel sounds are normal. He exhibits no distension.  Musculoskeletal:  Numerous RA deformities of hands/feet, both shoulders subluxed, right more than left. Had pain with PROM of numerous joints.  Neurological: He is alert.  Right facial paresis. Patient was able to answer occasional biographic question but expressive deficits as well as tendency to revert to [place]. BLE and RUE paresis noted. Was able to extend fingers on right hand with minimal assistance. Kept BLE flexed in frog leg position and had pain with attempts at extension of bilateral knees < feet. . Multiple deformities BLE and bilateral hands noted.  Skin: Skin is warm and dry. Rash (Fine macular rash noted on bilateral thighs and RUE) noted.  Psychiatric: He has a normal mood and affect. His behavior is normal. Judgment and thought content normal.   No results found for this or any previous visit (from the past 24 hour(s)).  Dg Chest 2 View  08/27/2013 CLINICAL DATA: Stroke. EXAM: CHEST 2 VIEW COMPARISON: Chest CT  07/21/2005. FINDINGS: Patchy bilateral pulmonary infiltrates are noted most consistent with pneumonia, possibly related to aspiration. Interstitial pulmonary edema cannot be excluded. Cardiomegaly is present with normal pulmonary vascularity. No pleural effusion or pneumothorax. IMPRESSION: Patchy pulmonary infiltrates noted bilaterally most consistent with pneumonia. Electronically Signed By: Marcello Moores Register On: 08/27/2013 12:21  Dg Ankle Left Port  08/26/2013 CLINICAL DATA: Pain and limited range of motion EXAM: PORTABLE LEFT ANKLE - 2 VIEW COMPARISON:  None. FINDINGS: Frontal and lateral views were obtained. There is no acute fracture. Ankle mortise appears intact. There is remodeling of the tailor dome suggesting prior trauma in this area. There is narrowing posteriorly consistent with a degree of osteoarthritic change. There is pes planus. Bones appear somewhat osteoporotic. IMPRESSION: Question old trauma to the talus with remodeling. 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 No acute fracture. Mortise appears grossly intact. No joint effusion. Bones osteoporotic. Electronically Signed By: Lowella Grip M.D. On: 08/26/2013 13:44   Assessment/Plan:  Diagnosis: ICH, embolic infarcts in a patient with post-polio syndrome  1. Does the need for close, 24 hr/day medical supervision in concert with the patient's rehab needs make it unreasonable for this patient to be served in a less intensive setting? Yes 2. Co-Morbidities requiring supervision/potential complications: pain, joint deformities 3. Due to bladder management, bowel management, safety, skin/wound care, disease management, medication administration, pain management and patient education, does the patient require 24 hr/day rehab nursing? Yes 4. Does the patient require coordinated care of a physician, rehab nurse, PT (1-2 hrs/day, 5 days/week), OT (1-2 hrs/day, 5 days/week) and SLP (1-2 hrs/day, 5 days/week) to address physical and functional deficits in the context of the above medical diagnosis(es)? Yes Addressing deficits in the following areas: balance, endurance, locomotion, strength, transferring, bowel/bladder control, bathing, dressing, feeding, grooming, toileting, cognition, speech, language, swallowing and psychosocial support 5. Can the patient actively participate in an intensive therapy program of at least 3 hrs of therapy per day at least 5 days per week? Yes and Potentially 6. The potential for patient to make measurable gains while on inpatient rehab is  excellent 7. Anticipated functional outcomes upon discharge from inpatient rehab are min assist and mod assist with PT, min assist and mod assist with OT, supervision with SLP. 8. Estimated rehab length of stay to reach the above functional goals is: 20-28 days 9. Does the patient have adequate social supports to accommodate these discharge functional goals? Yes and Potentially 10. Anticipated D/C setting: Home 11. Anticipated post D/C treatments: HH therapy and Outpatient therapy 12. Overall Rehab/Functional Prognosis: good RECOMMENDATIONS:  This patient's condition is appropriate for continued rehabilitative care in the following setting: CIR  Patient has agreed to participate in recommended program. Yes  Note that insurance prior authorization may be required for reimbursement for recommended care.  Comment: Rehab Admissions Coordinator to follow up.  Thanks,  Meredith Staggers, MD, Mellody Drown  08/28/2013  Revision History...      Date/Time User Action    08/28/2013 9:30 AM Meredith Staggers, MD Sign    08/28/2013 8:44 AM Bary Leriche, PA-C Share    08/28/2013 8:44 AM Bary Leriche, PA-C Share   View Details Report    Routing History.Marland KitchenMarland Kitchen

## 2013-08-29 NOTE — Discharge Instructions (Signed)
Inpatient Rehab Discharge Instructions  [name] Discharge date and time:    Activities/Precautions/ Functional Status: Activity: activity as tolerated Diet: cardiac diet Wound Care:   Functional status:  ___ No restrictions     ___ Walk up steps independently _X__ 24/7 supervision/assistance   ___ Walk up steps with assistance ___ Intermittent supervision/assistance  ___ Bathe/dress independently ___ Walk with walker     ___ Bathe/dress with assistance ___ Walk Independently    ___ Shower independently ___ Walk with assistance    ___ Shower with assistance _X__ No alcohol     ___ Return to work/school ________  Special Instructions:    STROKE/TIA DISCHARGE INSTRUCTIONS SMOKING Cigarette smoking nearly doubles your risk of having a stroke & is the single most alterable risk factor  If you smoke or have smoked in the last 12 months, you are advised to quit smoking for your health.  Most of the excess cardiovascular risk related to smoking disappears within a year of stopping.  Ask you doctor about anti-smoking medications  [facility] Quit Line: 1-800-QUIT NOW  Free Smoking Cessation Classes [phone]  CHOLESTEROL Know your levels; limit fat & cholesterol in your diet  Lipid Panel     Component Value Date/Time   CHOL 157 08/23/2013 2225   TRIG 168* 08/23/2013 2225   HDL 34* 08/23/2013 2225   CHOLHDL 4.6 08/23/2013 2225   VLDL 34 08/23/2013 2225   LDLCALC 89 08/23/2013 2225      Many patients benefit from treatment even if their cholesterol is at goal.  Goal: Total Cholesterol (CHOL) less than 160  Goal:  Triglycerides (TRIG) less than 150  Goal:  HDL greater than 40  Goal:  LDL (LDLCALC) less than 100   BLOOD PRESSURE American Stroke Association blood pressure target is less that 120/80 mm/Hg  Your discharge blood pressure is:  BP: 139/51 mmHg  Monitor your blood pressure  Limit your salt and alcohol intake  Many individuals will require more than one medication  for high blood pressure  DIABETES (A1c is a blood sugar average for last 3 months) Goal HGBA1c is under 7% (HBGA1c is blood sugar average for last 3 months)  Diabetes:     No results found for this basename: HGBA1C     Your HGBA1c can be lowered with medications, healthy diet, and exercise.  Check your blood sugar as directed by your physician  Call your physician if you experience unexplained or low blood sugars.  PHYSICAL ACTIVITY/REHABILITATION Goal is 30 minutes at least 4 days per week  Activity: No driving, Therapies: See above Return to work: N/A  Activity decreases your risk of heart attack and stroke and makes your heart stronger.  It helps control your weight and blood pressure; helps you relax and can improve your mood.  Participate in a regular exercise program.  Talk with your doctor about the best form of exercise for you (dancing, walking, swimming, cycling).  DIET/WEIGHT Goal is to maintain a healthy weight  Your discharge diet is: Cardiac  thin liquids Your height is:  Height: 5' (152.4 cm) Your current weight is: Weight: 59.9 kg (132 lb 0.9 oz) Your Body Mass Index (BMI) is:  BMI (Calculated): 25.8  Following the type of diet specifically designed for you will help prevent another stroke.  Your goal weight is:    Your goal Body Mass Index (BMI) is 19-24.  Healthy food habits can help reduce 3 risk factors for stroke:  High cholesterol, hypertension, and excess weight.  RESOURCES Stroke/Support Group:  Call [phone]   STROKE EDUCATION PROVIDED/REVIEWED AND GIVEN TO PATIENT Stroke warning signs and symptoms How to activate emergency medical system (call 911). Medications prescribed at discharge. Need for follow-up after discharge. Personal risk factors for stroke. Pneumonia vaccine given:  Flu vaccine given:  My questions have been answered, the writing is legible, and I understand these instructions.  I will adhere to these goals & educational materials  that have been provided to me after my discharge from the hospital.      My questions have been answered and I understand these instructions. I will adhere to these goals and the provided educational materials after my discharge from the hospital.  Patient/Caregiver Signature _______________________________ Date __________  Clinician Signature _______________________________________ Date __________  Please bring this form and your medication list with you to all your follow-up doctor's appointments.

## 2013-08-29 NOTE — Evaluation (Signed)
Occupational Therapy Assessment and Plan  Patient Details  Name: [name] MRN: [id number] Date of Birth: 1951/02/28  OT Diagnosis: abnormal posture, hemiplegia affecting non-dominant side, muscular wasting and disuse atrophy, muscle weakness (generalized) and pain in joint Rehab Potential: Rehab Potential: Good ELOS: 21 days   Today's Date: 08/29/2013 Time: 7902-4097 Time Calculation (min): 55 min  Problem List:  Patient Active Problem List   Diagnosis Date Noted  . HTN (hypertension) 08/29/2013  . 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 Acute embolic stroke 35/32/9924  . ICH (intracerebral hemorrhage) 08/22/2013  . Stroke 08/22/2013    Past Medical History:  Past Medical History  Diagnosis Date  . Hypertension   . Polio    Past Surgical History: No past surgical history on file.  Assessment & Plan Clinical Impression: Patient is a 63 y.o. year old male with recent admission to the hospital on 08/22/13 with complaints of malaise with generalized weakness x 10 days as well as inability to walk for a few days.he was noted to be drowsy with elevated BP in ED. CT head with acute Intraparenchymal hemorrhage right basal ganglia. He had increase in lethargy on 05/14 and follow up CCT with additional multifocal ischemic infarcts involving bilateral parietal and temporal lobes as well as right occipital lobe. Patient transferred to CIR on 08/28/2013 .    Patient currently requires total with basic self-care skills secondary to muscle weakness, impaired timing and sequencing and decreased coordination and decreased sitting balance, decreased standing balance, decreased postural control and decreased balance strategies.  Prior to hospitalization, patient could complete ADLs  with min.  Patient will benefit from skilled intervention to decrease level of assist with basic self-care skills and increase independence with basic self-care skills prior to discharge home with care  partner.  Anticipate patient will require moderate physical assestance and follow up home health.  OT - End of Session Activity Tolerance: Tolerates 30+ min activity with multiple rests Endurance Deficit: Yes Endurance Deficit Description: impaired endurance, anxiety OT Assessment Rehab Potential: Good Barriers to Discharge: Decreased caregiver support Barriers to Discharge Comments: unsure of available 24 hour supervision at this point OT Patient demonstrates impairments in the following area(s): Balance;Cognition;Endurance;Motor;Safety OT Basic ADL's Functional Problem(s): Eating;Grooming;Bathing;Dressing;Toileting OT Transfers Functional Problem(s): Toilet;Tub/Shower OT Additional Impairment(s): Fuctional Use of Upper Extremity OT Plan OT Intensity: Minimum of 1-2 x/day, 45 to 90 minutes OT Frequency: 5 out of 7 days OT Duration/Estimated Length of Stay: 21 days OT Treatment/Interventions: Balance/vestibular training;Cognitive remediation/compensation;DME/adaptive equipment instruction;Functional mobility training;Discharge planning;Psychosocial support;Therapeutic Activities;Visual/perceptual remediation/compensation;UE/LE Coordination activities;Splinting/orthotics;Patient/family education;Functional electrical stimulation;Community reintegration;Pain management;UE/LE Strength taining/ROM;Self Care/advanced ADL retraining;Neuromuscular re-education;Therapeutic Exercise OT Self Feeding Anticipated Outcome(s): supervision OT Basic Self-Care Anticipated Outcome(s): mod assist OT Toileting Anticipated Outcome(s): max assist OT Bathroom Transfers Anticipated Outcome(s): max assist. OT Recommendation Patient destination: Home Follow Up Recommendations: 24 hour supervision/assistance;Home health OT Equipment Recommended: Sliding board;Tub/shower bench;Other (comment) Equipment Details: drop arm commmode, hospital bed   OT Evaluation Precautions/Restrictions  Precautions Precautions:  Fall Precaution Comments: For change in neurologic condition, patient c/o severe headache, or nausea and vomiting develops.  SBP > 160 (not controlled by prn medications) or less than 100, DBP > 110 or less than 50, Temperature > 100.5 F, Heart rate > 120 or < 50, blood glucose > 180 or < 60 mg/dl; Air mattress--SR x 4, bilat PREVALON boots in bed Restrictions Weight Bearing Restrictions: No General   Vital Signs Therapy Vitals BP: 173/90 mmHg Pain Pain Assessment Pain Assessment: No/denies pain Faces Pain Scale: No hurt  Home Living/Prior Functioning Home Living Available Help at Discharge: Family;Other (Comment) (Wife works) Type of Home: House Home Access: Other (comment) (need to confirm with family; pt unable to report) Home Layout: One level Additional Comments: PTA, pt lived with wife and 64 yo son  Lives With: Spouse IADL History Homemaking Responsibilities: No Current License: No Prior Function Level of Independence: Independent with transfers;Requires assistive device for independence;Needs assistance with gait  Able to Take Stairs?: No Driving: No Vocation: Unemployed ADL  See FIM scale for details  Vision/Perception  Vision- History Baseline Vision/History: No visual deficits Vision- Assessment Vision Assessment?: Vision impaired- to be further tested in functional context  Cognition Overall Cognitive Status: Difficult to assess Arousal/Alertness: Awake/alert Orientation Level: Oriented to person;Oriented to place;Oriented to situation Attention: Sustained;Focused Focused Attention: Appears intact Sustained Attention: Appears intact Awareness: Impaired Awareness Impairment: Intellectual impairment Comments: No family present to translate or determine baseline cognition.  Pt attempts to follow and initiate one step commands related to bed mobility and selfcare tasks without difficulty.   Pt unable to state if his weakness in the RUE is new or if he was like  this previously.   Sensation Sensation Light Touch: Appears Intact (Simultaneous filing. User may not have seen previous data.) Stereognosis: Not tested (Simultaneous filing. User may not have seen previous data.) Hot/Cold: Not tested (Simultaneous filing. User may not have seen previous data.) Proprioception: Not tested Coordination Gross Motor Movements are Fluid and Coordinated: No (Simultaneous filing. User may not have seen previous data.) Fine Motor Movements are Fluid and Coordinated: No Coordination and Movement Description: Pt with bilateral limited gross and FM coordination with the RUE being more impaired than the left.  He was able to hold the washcloth with the left UE but not the right.  Limited bilateral shoulder flexion for selfcare tasks. (Simultaneous filing. User may not have seen previous data.) Motor  Motor Motor: Paraplegia;Hemiplegia;Abnormal postural alignment and control;Motor apraxia Motor - Skilled Clinical Observations: R UE and bilat LE weakness secondary to polio; L shoulder weakness from CVA? vs. overuse; impaired motor planning transitional movement, in sitting pt falls posterior and L with inability to self correct Mobility  Bed Mobility Bed Mobility: Sit to Supine;Supine to Sit Supine to Sit: 1: +2 Total assist Supine to Sit Details: Verbal cues for technique;Manual facilitation for weight shifting;Manual facilitation for placement Supine to Sit Details (indicate cue type and reason): Pt able to initiate supine >sit with cues but pt required total A to complete rolling and side > sit Sit to Supine: 1: +2 Total assist Sit to Supine - Details: Manual facilitation for weight shifting;Verbal cues for technique;Manual facilitation for placement Sit to Supine - Details (indicate cue type and reason): +2 A to return from sitting EOB to supine for B&D Transfers Sit to Stand: 1: +2 Total assist;From elevated surface;With upper extremity assist Sit to Stand Details  (indicate cue type and reason): Attempted sit > stand from elevated bed x 2 reps with +2 A for UE support and to block knees; pt unable both attemptes to come to full stand; pt very fearful of anterior lean   Trunk/Postural Assessment  Cervical Assessment Cervical Assessment: Within Functional Limits (Simultaneous filing. User may not have seen previous data.) Thoracic Assessment Thoracic Assessment: Exceptions to [facility] (slight kyphosis, significant posterior/L lateral LOB Simultaneous filing. User may not have seen previous data.) Thoracic Strength Overall Thoracic Strength Comments: Pt demonstrates thoracic kyphosis in sitting. Lumbar Assessment Lumbar Assessment: Exceptions to [facility] (decreased lordosis) Postural Control  Postural Control: Deficits on evaluation (absent trunk control for self correction of LOB or excursion out of BOS)  Balance Balance Balance Assessed: Yes Static Sitting Balance Static Sitting - Balance Support: Right upper extremity supported;Left upper extremity supported;Feet supported Static Sitting - Level of Assistance: 2: Max assist;1: +1 Total assist Static Sitting - Comment/# of Minutes: Pt with increased LOB posteriorly in sitting EOB. Dynamic Sitting Balance Dynamic Sitting - Balance Support: Right upper extremity supported;Left upper extremity supported;Feet supported Dynamic Sitting - Level of Assistance: 1: +1 Total assist Dynamic Sitting - Balance Activities: Lateral lean/weight shifting;Reaching for objects;Forward lean/weight shifting Sitting balance - Comments: Attempted to have pt reach for items in front of him to encourage anterior lean/weight shift.  Pt very fearful and lacked sufficient trunk control for excursion out of BOS Extremity/Trunk Assessment RUE Assessment RUE Assessment: Exceptions to [facility] RUE Strength RUE Overall Strength Comments: right UE with history of Polio per chart.  PROM WFLs for all joints but AROM shoulder flexion absent.  Elbow  flexion and extension 2-/5.  Pt maintains flexed digits at rest but can demonstrate active digit extension slightly in the 4th and 5th digits but very minimal in the 2nd and 3rd as well as the thumb.   LUE Assessment LUE Assessment: Exceptions to [facility] LUE Strength LUE Overall Strength Comments: AAROM shoulder flexion WFLS, 2-/5 with AROM,  elbow flexion 4/5, elbow extension 3/5, grasp and release 4/5.  Decreased FM coordination with finger to thumb however.  Used functionally for selfcare tasks including brushing his teeth and washing his face.    FIM:  FIM - Grooming Grooming Steps: Wash, rinse, dry face;Brush, comb hair Grooming: 3: Patient completes 2 of 4 or 3 of 5 steps FIM - Bathing Bathing Steps Patient Completed: Chest;Abdomen;Left upper leg Bathing: 2: Max-Patient completes 3-4 [id number] parts or 25-49% FIM - Upper Body Dressing/Undressing Upper body dressing/undressing: 0: Activity did not occur FIM - Lower Body Dressing/Undressing Lower body dressing/undressing: 1: Total-Patient completed less than 25% of tasks FIM - Toileting Toileting: 0: Activity did not occur FIM - BControl and instrumentation engineerDevices: Bed rails Bed/Chair Transfer: 1: Supine > Sit: Total A (helper does all/Pt. < 25%);1: Sit > Supine: Total A (helper does all/Pt. < 25%) FIM - TAir cabin crewTransfers: 0-Activity did not occur FIM - Tub/Shower Transfers Tub/shower Transfers: 0-Activity did not occur or was simulated   Refer to Care Plan for Long Term Goals  Recommendations for other services: None  Discharge Criteria: Patient will be discharged from OT if patient refuses treatment 3 consecutive times without medical reason, if treatment goals not met, if there is a change in medical status, if patient makes no progress towards goals or if patient is discharged from hospital.  The above assessment, treatment plan, treatment alternatives and goals were discussed and mutually agreed  upon: by patient Began education on selfcare retraining in supine position.  Pt needing extensive max assist for bed mobility as well as transitions supine to sit EOB.  Increased posterior lean in sitting and decreased forward trunk flexion for transfer to the wheelchair from EOB.  Pt with history of polio affecting bilateral LEs and RUE.  Pt is able to use the LUE for simple selfcare tasks such as grooming and eating with supervision.   JCindra PresumeOTR/L 08/29/2013, 12:58 PM

## 2013-08-29 NOTE — Progress Notes (Signed)
Patient's blood pressure 200/105 with therapy and patient states he feels dizzy.  Scheduled blood pressure medications given.  Algis Liming, PA notified of blood pressure; no orders received.  Will continue to monitor.

## 2013-08-29 NOTE — Progress Notes (Signed)
Subjective/Complaints: 63 y.o. 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 CT head with acute Intraparenchymal hemorrhage right basal ganglia. He had increase in lethargy on 05/14 and follow up CCT with additional multifocal ischemic infarcts involving bilateral parietal and temporal lobes as well as right occipital lobe. 2D echo with EF 65-70% with no wall abnormality. Carotid dopplers without ICA stenosis. Patient with resultant paraparesis as well as LUE weakness, apraxia, decreased verbal output with naming errors as well as difficulty with multistep commands   Objective: Vital Signs: Blood pressure 139/51, pulse 61, temperature 99 F (37.2 C), temperature source Oral, resp. rate 18, weight 59.9 kg (132 lb 0.9 oz), SpO2 96.00%. Dg Chest 2 View  08/27/2013   CLINICAL DATA:  Stroke.  EXAM: CHEST  2 VIEW  COMPARISON:  Chest CT 07/21/2005.  FINDINGS: Patchy bilateral pulmonary infiltrates are noted most consistent with pneumonia, possibly related to aspiration. Interstitial pulmonary edema cannot be excluded. Cardiomegaly is present with normal pulmonary vascularity. No pleural effusion or pneumothorax.  IMPRESSION: Patchy pulmonary infiltrates noted bilaterally most consistent with pneumonia.   Electronically Signed   By: Marcello Moores  Register   On: 08/27/2013 12:21   Results for orders placed during the hospital encounter of 08/28/13 (from the past 72 hour(s))  COMPREHENSIVE METABOLIC PANEL     Status: Abnormal   Collection Time    08/29/13  5:44 AM      Result Value Ref Range   Sodium 140  137 - 147 mEq/L   Potassium 4.6  3.7 - 5.3 mEq/L   Chloride 104  96 - 112 mEq/L   CO2 23  19 - 32 mEq/L   Glucose, Bld 106 (*) 70 - 99  mg/dL   BUN 19  6 - 23 mg/dL   Creatinine, Ser 0.76  0.50 - 1.35 mg/dL   Calcium 9.3  8.4 - 10.5 mg/dL   Total Protein 7.2  6.0 - 8.3 g/dL   Albumin 2.9 (*) 3.5 - 5.2 g/dL   AST 29  0 - 37 U/L   ALT 31  0 - 53 U/L   Alkaline Phosphatase 52  39 - 117 U/L   Total Bilirubin 0.9  0.3 - 1.2 mg/dL   GFR calc non Af Amer >90  >90 mL/min   GFR calc Af Amer >90  >90 mL/min   Comment: (NOTE)     The eGFR has been calculated using the CKD EPI equation.     This calculation has not been validated in all clinical situations.     eGFR's persistently <90 mL/min signify possible Chronic Kidney     Disease.  CBC WITH DIFFERENTIAL     Status: None   Collection Time    08/29/13  5:44 AM      Result Value Ref Range   WBC 7.5  4.0 - 10.5 K/uL   RBC 4.49  4.22 - 5.81 MIL/uL   Hemoglobin 13.8  13.0 - 17.0 g/dL   HCT 40.1  39.0 - 52.0 %   MCV 89.3  78.0 - 100.0 fL   MCH 30.7  26.0 - 34.0 pg   MCHC 34.4  30.0 - 36.0 g/dL   RDW 13.2  11.5 - 15.5 %   Platelets  177  150 - 400 K/uL   Neutrophils Relative % 68  43 - 77 %   Neutro Abs 5.1  1.7 - 7.7 K/uL   Lymphocytes Relative 18  12 - 46 %   Lymphs Abs 1.4  0.7 - 4.0 K/uL   Monocytes Relative 11  3 - 12 %   Monocytes Absolute 0.8  0.1 - 1.0 K/uL   Eosinophils Relative 3  0 - 5 %   Eosinophils Absolute 0.2  0.0 - 0.7 K/uL   Basophils Relative 0  0 - 1 %   Basophils Absolute 0.0  0.0 - 0.1 K/uL      General: No acute distress Mood and affect are appropriate Heart: Regular rate and rhythm no rubs murmurs or extra sounds Lungs: Clear to auscultation, breathing unlabored, no rales or wheezes Abdomen: Positive bowel sounds, soft nontender to palpation, nondistended Extremities: no edema, atrophy in RUE with fasiculations in EDC, BLE atrophy in thigh , leg and feet Skin: No evidence of breakdown, no evidence of rash Neurologic: Cranial nerves II through XII intact, motor strength is 0/5 in RUE 4/5 in left deltoid, bicep, tricep, grip, 3-/5 bilateral hip  flexor,0/5 in Bilateral  knee extensors, ankle dorsiflexor and plantar flexor, increased tone LUE, decreased tone RUE and BLE Sensory exam limited by language  Musculoskeletal: Full passive range of motion in all 4 extremities.Frog leg posture  No joint swelling   Assessment/Plan: 1. 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 Physiatrist is providing close team supervision and 24 hour management of active medical problems listed below. Physiatrist and rehab team continue to assess barriers to discharge/monitor patient progress toward functional and medical goals. FIM:                                   Medical Problem List and Plan:  1. Functional deficits secondary to [place], embolic infarcts in a patient with post-polio syndrome  2. DVT Prophylaxis/Anticoagulation: Pharmaceutical: Lovenox  3. Pain Management: Used naprosyn bid at home. May need to schedule tylenol--monitor for now.  4. Mood: Provide ego support. LCSW to follow for evaluation and support.  5. Neuropsych: This patient is is not capable of making decisions on his own behalf.  6. HTN: Will monitor BP every 8 hours and adjust medication as needed for tighter control. Continue lisinopril and HCTZ  7. Bilateral patchy infiltrates exam negative, xrays with poor inspiration, WBC normal. D/C levaquin and monitor 8. 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 Routine pressure relief measures-- will order air mattress overlay.   LOS (Days) 1 A FACE TO FACE EVALUATION WAS PERFORMED  Charlett Blake 08/29/2013, 8:01 AM

## 2013-08-29 NOTE — Progress Notes (Signed)
Patient information reviewed and entered into eRehab system by Alvaro Aungst, RN, CRRN, PPS Coordinator.  Information including medical coding and functional independence measure will be reviewed and updated through discharge.     Per nursing patient was given "Data Collection Information Summary for Patients in Inpatient Rehabilitation Facilities with attached "Privacy Act Statement-Health Care Records" upon admission.  

## 2013-08-30 ENCOUNTER — Inpatient Hospital Stay (HOSPITAL_COMMUNITY): Payer: Medicare Other | Admitting: Speech Pathology

## 2013-08-30 ENCOUNTER — Inpatient Hospital Stay (HOSPITAL_COMMUNITY): Payer: Medicare Other | Admitting: Physical Therapy

## 2013-08-30 ENCOUNTER — Encounter (HOSPITAL_COMMUNITY): Payer: Medicare Other | Admitting: Occupational Therapy

## 2013-08-30 ENCOUNTER — Inpatient Hospital Stay (HOSPITAL_COMMUNITY): Payer: Medicare Other | Admitting: Occupational Therapy

## 2013-08-30 DIAGNOSIS — I619 Nontraumatic intracerebral hemorrhage, unspecified: Secondary | ICD-10-CM

## 2013-08-30 MED ORDER — LISINOPRIL 20 MG PO TABS
20.0000 mg | ORAL_TABLET | Freq: Every day | ORAL | Status: DC
  Administered 2013-08-31 – 2013-09-01 (×2): 20 mg via ORAL
  Filled 2013-08-30 (×3): qty 1

## 2013-08-30 MED ORDER — HYDROCHLOROTHIAZIDE 25 MG PO TABS
25.0000 mg | ORAL_TABLET | Freq: Every day | ORAL | Status: DC
  Administered 2013-08-31 – 2013-09-05 (×6): 25 mg via ORAL
  Filled 2013-08-30 (×7): qty 1

## 2013-08-30 NOTE — Progress Notes (Signed)
Social Work Patient ID: [name], male   DOB: 02-08-1951, 63 y.o.   MRN: [id number] When pt's daughter was here met with her to inform of this morning with the tow gentleman trying to get pt to sign forms. She was very appreciative regarding being informed and in agreement that pt should not be singing any documents. She would like to get the person's name if he shows up again.  Have alerted pt's RN and MD is aware also.

## 2013-08-30 NOTE — Progress Notes (Signed)
Occupational Therapy Session Note  Patient Details  Name: [name] MRN: [id number] Date of Birth: 01-10-1951  Today's Date: 08/30/2013 Time: 1001-1102 Time Calculation (min): 61 min  Short Term Goals: Week 1:  OT Short Term Goal 1 (Week 1): Pt will maintain static sitting EOB with min assist for 3 mins in preparation for selfcare tasks. OT Short Term Goal 2 (Week 1): Pt will self feed using the LUE primarily with AE and supervision. OT Short Term Goal 3 (Week 1): Pt will perform 3 grooming tasks in supported sitting at the sink with supervision. OT Short Term Goal 4 (Week 1): Pt will perform UB bathing with min assist in supported sitting.  OT Short Term Goal 5 (Week 1): Pt will perform toilet transfers to drop arm commode vs commode bench with total assist using sliding board.   Skilled Therapeutic Interventions/Progress Updates:    Pt transferred supine to sit EOB with total assist to begin session.  Max demonstrational cueing as well to sequence steps of the transfer.  Worked on bathing and dressing sitting EOB unsupported with translator present.  Pt with increased posterior lean in sitting and LOB requiring overall max assist to maintain balance while working on UB bathing.  He demonstrates decreased initiation and performance of forward trunk flexion for reaching the bedside table, which contained his washcloth.  No functional use of the RUE noted during session but he was able to use the LUE for washing.  He needed mod assist for shoulder flexion on the left to reach the washcloth.  Total assist for donning and doffing gripper socks.  Pt with bilateral ankle and foot arthritic changes as well as muscle atrophy.  Transferred total assist +2 (pt 10%) from bed to wheelchair via sliding board.    Therapy Documentation Precautions:  Precautions Precautions: Fall Precaution Comments: For change in neurologic condition, patient c/o severe headache, or nausea and vomiting develops.  SBP > 160  (not controlled by prn medications) or less than 100, DBP > 110 or less than 50, Temperature > 100.5 F, Heart rate > 120 or < 50, blood glucose > 180 or < 60 mg/dl; Air mattress--SR x 4, bilat PREVALON boots in bed Restrictions Weight Bearing Restrictions: No  Pain: Pain Assessment Pain Assessment: No/denies pain ADL: See FIM for current functional status  Therapy/Group: Individual Therapy  Cindra Presume OTR/L 08/30/2013, 12:24 PM

## 2013-08-30 NOTE — Progress Notes (Signed)
Occupational Therapy Session Note  Patient Details  Name: [name] MRN: [id number] Date of Birth: 06-06-1950  Today's Date: 08/30/2013 Time: 8099-8338 Time Calculation (min): 45 min  Short Term Goals: Week 1:  OT Short Term Goal 1 (Week 1): Pt will maintain static sitting EOB with min assist for 3 mins in preparation for selfcare tasks. OT Short Term Goal 2 (Week 1): Pt will self feed using the LUE primarily with AE and supervision. OT Short Term Goal 3 (Week 1): Pt will perform 3 grooming tasks in supported sitting at the sink with supervision. OT Short Term Goal 4 (Week 1): Pt will perform UB bathing with min assist in supported sitting.  OT Short Term Goal 5 (Week 1): Pt will perform toilet transfers to drop arm commode vs commode bench with total assist using sliding board.   Skilled Therapeutic Interventions/Progress Updates:    Pt's daughter present for session and able to offer translation.  She reports not being familiar if pt was able to perform his bathing and dressing at home but did state on his last trip to [place] his family there had to to help him more with bathing per there report.  Worked on transfers using the sliding board and having pt work on forward trunk flexion to assist.  Pt with increased fear of coming forward and demonstrates decreased spinal control to return to sitting once he bends forward unless using his UEs.  Pt with very little active participation in transfers as this time.  Attempts to hold onto therapist and not really pushing with either UE or his LEs.  Total assist +2 (pt 10%) for sliding board transfers.  In sitting on the mat worked on placing bilateral UEs on the bedside table and pushing the table forward with mod assist to promote trunk flexion.  Max assist needed to return to sitting upright.  Pt returned to wheelchair with total +2 and then back to bed at end of session.  Instructed pt's daughter to bring in clothing and shoes for pt to wear as well.     Therapy Documentation Precautions:  Precautions Precautions: Fall Precaution Comments: For change in neurologic condition, patient c/o severe headache, or nausea and vomiting develops.  SBP > 160 (not controlled by prn medications) or less than 100, DBP > 110 or less than 50, Temperature > 100.5 F, Heart rate > 120 or < 50, blood glucose > 180 or < 60 mg/dl; Air mattress--SR x 4, bilat PREVALON boots in bed Restrictions Weight Bearing Restrictions: No  Pain: Pain Assessment Pain Assessment: No/denies pain ADL: See FIM for current functional status  Therapy/Group: Individual Therapy  Cindra Presume OTR/L 08/30/2013, 3:41 PM

## 2013-08-30 NOTE — Progress Notes (Signed)
Speech Language Pathology Note  Patient Details  Name: [name] MRN: [id number] Date of Birth: 12-05-1950 Today's Date: 08/30/2013  Of note, during skilled SLP session two people entered room reporting to be friends of patient and handed him papers to sign.  Upon SLP questioning what they were doing one "friend" then reported that he was a notary and was there to witness the patient sign papers related to the title of his house.  SLP informed and educated "friends" that the MD needed to determine competency prior to legal documents being signed.  The other non-notary friend made a call on his cell phone and informed someone that the lady would not let the patient sign the papers and they would come back later and when she was gone.  Seth Ward was present for session and translated information to SLP and requested the information be documented in the patient's chart.  Social Development worker, community, RN, Chartered certified accountant and MD notified.    Gunnar Fusi, M.A., CCC-SLP [phone]  [name] 08/30/2013, 10:54 AM

## 2013-08-30 NOTE — Progress Notes (Signed)
Subjective/Complaints: 63 y.o. 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 CT head with acute Intraparenchymal hemorrhage right basal ganglia. He had increase in lethargy on 05/14 and follow up CCT with additional multifocal ischemic infarcts involving bilateral parietal and temporal lobes as well as right occipital lobe. 2D echo with EF 65-70% with no wall abnormality. Carotid dopplers without ICA stenosis. Patient with resultant paraparesis as well as LUE weakness, apraxia, decreased verbal output with naming errors as well as difficulty with multistep commands  Review of Systems - limited, cannot obtain secondary to language and cognition Objective: Vital Signs: Blood pressure 162/100, pulse 56, temperature 98.3 F (36.8 C), temperature source Oral, resp. rate 18, height 5' (1.524 m), weight 56.7 kg (125 lb), SpO2 99.00%. Dg Chest 2 View  08/29/2013   CLINICAL DATA:  Followup pleural effusion  EXAM: CHEST  2 VIEW  COMPARISON:  08/27/2013  FINDINGS: Cardiac shadow remains enlarged. The previously seen infiltrates have resolved in the interval. No sizable effusion is noted. No bony abnormality is noted.  IMPRESSION: No active cardiopulmonary disease.   Electronically Signed   By: Inez Catalina M.D.   On: 08/29/2013 11:19   Results for orders placed during the hospital encounter of 08/28/13 (from the past 72 hour(s))  COMPREHENSIVE METABOLIC PANEL     Status: Abnormal   Collection Time    08/29/13  5:44 AM      Result Value Ref Range   Sodium 140  137 - 147 mEq/L   Potassium 4.6  3.7 - 5.3 mEq/L   Chloride 104  96 - 112 mEq/L   CO2 23  19 - 32 mEq/L   Glucose, Bld 106 (*) 70 - 99 mg/dL   BUN 19  6 - 23 mg/dL    Creatinine, Ser 0.76  0.50 - 1.35 mg/dL   Calcium 9.3  8.4 - 10.5 mg/dL   Total Protein 7.2  6.0 - 8.3 g/dL   Albumin 2.9 (*) 3.5 - 5.2 g/dL   AST 29  0 - 37 U/L   ALT 31  0 - 53 U/L   Alkaline Phosphatase 52  39 - 117 U/L   Total Bilirubin 0.9  0.3 - 1.2 mg/dL   GFR calc non Af Amer >90  >90 mL/min   GFR calc Af Amer >90  >90 mL/min   Comment: (NOTE)     The eGFR has been calculated using the CKD EPI equation.     This calculation has not been validated in all clinical situations.     eGFR's persistently <90 mL/min signify possible Chronic Kidney     Disease.  CBC WITH DIFFERENTIAL     Status: None   Collection Time    08/29/13  5:44 AM      Result Value Ref Range   WBC 7.5  4.0 - 10.5 K/uL   RBC 4.49  4.22 - 5.81 MIL/uL   Hemoglobin 13.8  13.0 - 17.0 g/dL   HCT 40.1  39.0 - 52.0 %   MCV 89.3  78.0 - 100.0 fL   MCH 30.7  26.0 - 34.0 pg   MCHC 34.4  30.0 - 36.0 g/dL   RDW 13.2  11.5 - 15.5 %   Platelets  177  150 - 400 K/uL   Neutrophils Relative % 68  43 - 77 %   Neutro Abs 5.1  1.7 - 7.7 K/uL   Lymphocytes Relative 18  12 - 46 %   Lymphs Abs 1.4  0.7 - 4.0 K/uL   Monocytes Relative 11  3 - 12 %   Monocytes Absolute 0.8  0.1 - 1.0 K/uL   Eosinophils Relative 3  0 - 5 %   Eosinophils Absolute 0.2  0.0 - 0.7 K/uL   Basophils Relative 0  0 - 1 %   Basophils Absolute 0.0  0.0 - 0.1 K/uL      General: No acute distress Mood and affect are appropriate Heart: Regular rate and rhythm no rubs murmurs or extra sounds Lungs: Clear to auscultation, breathing unlabored, no rales or wheezes Abdomen: Positive bowel sounds, soft nontender to palpation, nondistended Extremities: no edema, atrophy in RUE with fasiculations in EDC, BLE atrophy in thigh , leg and feet Skin: No evidence of breakdown, no evidence of rash Neurologic: Cranial nerves II through XII intact, motor strength is 0/5 in RUE 4/5 in left deltoid, bicep, tricep, grip, 3-/5 bilateral hip flexor,0/5 in Bilateral  knee  extensors, ankle dorsiflexor and plantar flexor, increased tone LUE, decreased tone RUE and BLE Sensory exam limited by language  Musculoskeletal: Full passive range of motion in all 4 extremities.Frog leg posture  No joint swelling   Assessment/Plan: 1. 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 Physiatrist is providing close team supervision and 24 hour management of active medical problems listed below. Physiatrist and rehab team continue to assess barriers to discharge/monitor patient progress toward functional and medical goals. FIM: FIM - Bathing Bathing Steps Patient Completed: Chest;Abdomen;Left upper leg Bathing: 2: Max-Patient completes 3-4 57f10 parts or 25-49%  FIM - Upper Body Dressing/Undressing Upper body dressing/undressing: 0: Activity did not occur FIM - Lower Body Dressing/Undressing Lower body dressing/undressing: 1: Total-Patient completed less than 25% of tasks  FIM - Toileting Toileting: 0: Activity did not occur  FIM - TRadio producerDevices:  (commode bench) Toilet Transfers: 1-Two helpers  FIM - BControl and instrumentation engineerDevices: Bed rails Bed/Chair Transfer: 1: Supine > Sit: Total A (helper does all/Pt. < 25%);1: Sit > Supine: Total A (helper does all/Pt. < 25%)  FIM - Locomotion: Wheelchair Locomotion: Wheelchair: 0: Activity did not occur FIM - Locomotion: Ambulation Locomotion: Ambulation: 0: Activity did not occur  Comprehension Comprehension Mode: Auditory Comprehension: 3-Understands basic 50 - 74% of the time/requires cueing 25 - 50%  of the time  Expression Expression Mode: Verbal Expression: 2-Expresses basic 25 - 49% of the time/requires cueing 50 - 75% of the time. Uses single  words/gestures.  Social Interaction Social Interaction: 4-Interacts appropriately 75 - 89% of the time - Needs redirection for appropriate language or to initiate interaction.  Problem Solving Problem Solving: 3-Solves basic 50 - 74% of the time/requires cueing 25 - 49% of the time  Memory Memory: 2-Recognizes or recalls 25 - 49% of the time/requires cueing 51 - 75% of the time   Medical Problem List and Plan:  1. Functional deficits secondary to [place] embolic infarcts in a patient with post-polio syndrome  2. DVT Prophylaxis/Anticoagulation: Pharmaceutical: Lovenox  3. Pain Management: Used naprosyn bid at home. May need to schedule tylenol--monitor  for now.  4. Mood: Provide ego support. LCSW to follow for evaluation and support.  5. Neuropsych: This patient is is not capable of making decisions on his own behalf.  6. HTN: Will monitor BP every 8 hours and adjust medication as needed for tighter control. Continue lisinopril and HCTZ  7. Bilateral patchy infiltrates exam negative, xrays with poor inspiration, WBC normal. D/C levaquin and monitor 8. 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 Routine pressure relief measures-- will order air mattress overlay.   LOS (Days) 2 A FACE TO FACE EVALUATION WAS PERFORMED  Charlett Blake 08/30/2013, 7:54 AM

## 2013-08-30 NOTE — Progress Notes (Signed)
Speech Language Pathology Daily Session Note  Patient Details  Name: [name] MRN: [id number] Date of Birth: [birth year]  Today's Date: 08/30/2013 Time: 0930-1000 Time Calculation (min): 30 min  Short Term Goals: Week 1: SLP Short Term Goal 1 (Week 1): Patient will utilize external aids for orientation with Mod assist  SLP Short Term Goal 2 (Week 1): Patient will express phrase lenght utterances with Mod sentence completion and question cues  Skilled Therapeutic Interventions: Skilled treatment session focused on addressing communication goals.  Interpreter present for session.  Patient was presented with pictures and was asked to verbally describe what people were doing.  SLP facilitated session with Max question and sentence completion cues to provide 2-3 word responses.     FIM:  Comprehension Comprehension Mode: Auditory Comprehension: 3-Understands basic 50 - 74% of the time/requires cueing 25 - 50%  of the time Expression Expression Mode: Verbal Expression: 2-Expresses basic 25 - 49% of the time/requires cueing 50 - 75% of the time. Uses single words/gestures. Social Interaction Social Interaction: 3-Interacts appropriately 50 - 74% of the time - May be physically or verbally inappropriate. Problem Solving Problem Solving: 2-Solves basic 25 - 49% of the time - needs direction more than half the time to initiate, plan or complete simple activities Memory Memory: 1-Recognizes or recalls less than 25% of the time/requires cueing greater than 75% of the time  Pain Pain Assessment Pain Assessment: No/denies pain  Therapy/Group: Individual Therapy  [name]., CCC-SLP [place] 08/30/2013, 5:02 PM

## 2013-08-30 NOTE — Evaluation (Signed)
Speech Language Pathology Assessment and Plan  Patient Details  Name: [name] MRN: [id number] Date of Birth: 09/29/1950  SLP Diagnosis: Speech and Language deficits;Cognitive Impairments  Rehab Potential: Fair ELOS: 18-21 days   Today's Date: 08/29/2013 (late entry) Time: 1405-1450 Time Calculation (min): 45 min  Problem List:  Patient Active Problem List   Diagnosis Date Noted  . HTN (hypertension) 08/29/2013  . 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 Acute embolic stroke 17/40/8144  . ICH (intracerebral hemorrhage) 08/22/2013  . Stroke 08/22/2013   Past Medical History:  Past Medical History  Diagnosis Date  . Hypertension   . Polio    Past Surgical History: No past surgical history on file.  Assessment / Plan / Recommendation Clinical Impression [name] is a 63 year old [place] male with history of HTN, post-polio syndrome with right sided weakness and multiple deformites, right thalamic hemorrhage 2012, who was admitted on 08/22/13 with complaints of malaise with generalized weakness x 10 days as well as inability to walk for a few days. He was noted to be drowsy with elevated BP in ED. CT head with acute Intraparenchymal hemorrhage right basal ganglia. He had increase in lethargy on 05/14 and follow up CCT with additional multifocal ischemic infarcts involving bilateral parietal and temporal lobes as well as right occipital lobe. 2D echo with EF 65-70% with no wall abnormality. Carotid dopplers without ICA stenosis. Patient with resultant paraparesis as well as LUE weakness, tolerating current diet, apraxia, decreased verbal output with naming errors as well as difficulty with multistep commands. CIR recommended by rehab team and patient admitted 08/28/13. Orders received; Cognitive-linguistic evaluation completed.  Patient's primary language is [place]; interpreter present for evaluation. Presents with decreased spontaneity of verbal expression at the phrase  length, naming errors, difficulty following multi-step commands, disorientation to place, time and situation. Given current deficits it is recommended that patient receive skilled SLP services in hopes maximizing functional communication prior to discharge with need for 24/7 assist.    Skilled Therapeutic Interventions          Cognitive-linguistic evaluation completed with results and recommendations reviewed with patient via intrepreter.    SLP Assessment  Patient will need skilled Speech Lanaguage Pathology Services during CIR admission    Recommendations  Supervision: Staff to assist with self feeding Postural Changes and/or Swallow Maneuvers: Seated upright 90 degrees Patient destination:  (TBD) Follow up Recommendations: 24 hour supervision/assistance Equipment Recommended: None recommended by SLP    SLP Frequency 5 out of 7 days   SLP Treatment/Interventions Cueing hierarchy;Cognitive remediation/compensation;Environmental controls;Functional tasks;Internal/external aids;Patient/family education;Speech/Language facilitation;Therapeutic Activities    Pain  0 Prior Functioning  no family present   Short Term Goals: Week 1: SLP Short Term Goal 1 (Week 1): Patient will utilize external aids for orientation with Mod assist  SLP Short Term Goal 2 (Week 1): Patient will express phrase lenght utterances with Mod sentence completion and question cues  See FIM for current functional status Refer to Care Plan for Long Term Goals  Recommendations for other services: None  Discharge Criteria: Patient will be discharged from SLP if patient refuses treatment 3 consecutive times without medical reason, if treatment goals not met, if there is a change in medical status, if patient makes no progress towards goals or if patient is discharged from hospital.  The above assessment, treatment plan, treatment alternatives and goals were discussed and mutually agreed upon: by patient  [name],  M.A., CCC-SLP [place] 08/30/2013, 7:55 AM

## 2013-08-30 NOTE — Progress Notes (Signed)
Physical Therapy Session Note  Patient Details  Name: Giankarlo Leamer MRN: [id number] Date of Birth: 1950-12-21  Today's Date: 08/30/2013 Time: 0830-0930 Time Calculation (min): 60 min  Short Term Goals: Week 1:  PT Short Term Goal 1 (Week 1): Pt will perform bed mobility with max A of one person PT Short Term Goal 2 (Week 1): Pt will perform bed <> w/c transfers with max A of one person PT Short Term Goal 3 (Week 1): Pt will perform dynamic sitting balance activities with mod A x 10 minutes PT Short Term Goal 4 (Week 1): Pt will tolerated supported standing (standing frame or tilt table) for LE and core strengthening x 5 minutes with total A  Skilled Therapeutic Interventions/Progress Updates:   Pt eating breakfast with nurse tech; pt still in bed.  Interpreter present for communication and recreation therapist present for co-treat and recreation evaluation.  Pt performed rolling to L side with mod-max A and max-total to sit upright on EOB secondary to anxiety and strong L lean.  Attempted to perform slideboard transfer bed > w/c but secondary to poor placement of board pt required total squat pivot to transfer to w/c.  W/c mobility not tested secondary to UE and shoulder weakness.  In gym continued slideboard training.  With improved positioning of the board pt able to assist 10-15% with slideboard transfers w/c <> mat and w/c > bed at end of session with max verbal and visual cues for full anterior lean on therapist to unweight buttocks for transfer; second person present for safety.  While seated at edge of mat performed trunk control, limits of stability and sitting balance training with lateral weight shifting, leaning down to R elbow and back up with +2 A with manual facilitation at ribs for elongation/shortening.  Pt noted to be incontinent of urine; returned to room and to bed with total A for hygiene and to change to clean brief before SLP session.   Therapy Documentation Precautions:   Precautions Precautions: Fall Precaution Comments: For change in neurologic condition, patient c/o severe headache, or nausea and vomiting develops.  SBP > 160 (not controlled by prn medications) or less than 100, DBP > 110 or less than 50, Temperature > 100.5 F, Heart rate > 120 or < 50, blood glucose > 180 or < 60 mg/dl; Air mattress--SR x 4, bilat PREVALON boots in bed Restrictions Weight Bearing Restrictions: No Vital Signs: Therapy Vitals Pulse Rate: 64 BP: 186/90 mmHg Patient Position (if appropriate): Sitting Oxygen Therapy SpO2: 99 % O2 Device: None (Room air) Pain: Pain Assessment Pain Assessment: No/denies pain  See FIM for current functional status  Therapy/Group: Co-Treatment with recreation therapy  [name] 08/30/2013, 11:42 AM

## 2013-08-30 NOTE — Progress Notes (Signed)
Recreational Therapy Assessment and Plan  Patient Details  Name: [name] MRN: [id number] Date of Birth: Nov 04, 1950 Today's Date: 08/30/2013  Rehab Potential: Good ELOS: 3 weeks   Assessment Clinical Impression:Problem List:  Patient Active Problem List    Diagnosis  Date Noted   .  HTN (hypertension)  08/29/2013   .  Post-poliomyelitis muscular atrophy  08/29/2013   .  Acute embolic stroke  69/79/4801   .  ICH (intracerebral hemorrhage)  08/22/2013   .  Stroke  08/22/2013    Past Medical History:  Past Medical History   Diagnosis  Date   .  Hypertension    .  Polio     Past Surgical History: No past surgical history on file.  Assessment & Plan  Clinical Impression: Patient is a 63 y.o. 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 CT head with acute Intraparenchymal hemorrhage right basal ganglia. He had increase in lethargy on 05/14 and follow up CCT with additional multifocal ischemic infarcts involving bilateral parietal and temporal lobes as well as right occipital lobe. 2D echo with EF 65-70% with no wall abnormality. Carotid dopplers without ICA stenosis. Pt transferred to CIR 08/28/13.  Pt presents with decreased activity tolerance, decreased functional mobility, decreased balance, decreased midline orientation, decreased attention, decreased awareness, decreased problem solving, & fear of falling Limiting pt's independence with leisure/community pursuits.   Leisure History/Participation Premorbid leisure interest/current participation: Sports - Other (Comment);[place]ra [place] - Flower gardening;Nature - Vegetable gardening Other Leisure Interests: Reading;Housework;Cooking/Baking Leisure Participation  Style: With Family/Friends Awareness of Community Resources: Good-identify 3 post discharge leisure resources Psychosocial / Spiritual Social interaction - Mood/Behavior: Cooperative Academic librarian Appropriate for Education?: Yes Strengths/Weaknesses Patient Strengths/Abilities: Willingness to participate;Active premorbidly Patient weaknesses: Physical limitations TR Patient demonstrates impairments in the following area(s): Endurance;Motor;Perception;Safety;Other (comment) (fearful of falling)  Plan Rec Therapy Plan Is patient appropriate for Therapeutic Recreation?: Yes Rehab Potential: Good Treatment times per week: Min 1 time per week >20 mintues Estimated Length of Stay: 3 weeks TR Treatment/Interventions: Adaptive equipment instruction;1:1 session;Balance/vestibular training;Functional mobility training;Cognitive remediation/compensation;Patient/family education;Therapeutic activities;Recreation/leisure participation;Therapeutic exercise;UE/LE Coordination activities;Wheelchair propulsion/positioning  Discharge Criteria: Patient will be discharged from TR if patient refuses treatment 3 consecutive times without medical reason.  If treatment goals not met, if there is a change in medical status, if patient makes no progress towards goals or if patient is discharged from hospital.  The above assessment, treatment plan, treatment alternatives and goals were discussed and mutually agreed upon: by patient  Waldon Reining 08/30/2013, 9:52 AM

## 2013-08-31 ENCOUNTER — Inpatient Hospital Stay (HOSPITAL_COMMUNITY): Payer: Medicare Other | Admitting: Physical Therapy

## 2013-08-31 ENCOUNTER — Inpatient Hospital Stay (HOSPITAL_COMMUNITY): Payer: Medicare Other | Admitting: Speech Pathology

## 2013-08-31 ENCOUNTER — Inpatient Hospital Stay (HOSPITAL_COMMUNITY): Payer: Medicare Other | Admitting: Occupational Therapy

## 2013-08-31 ENCOUNTER — Encounter (HOSPITAL_COMMUNITY): Payer: Medicare Other | Admitting: Occupational Therapy

## 2013-08-31 NOTE — Progress Notes (Signed)
Inpatient [facility] Individual Statement of Services  Patient Name:  [name]  Date:  08/31/2013  Welcome to the [facility].  Our goal is to provide you with an individualized program based on your diagnosis and situation, designed to meet your specific needs.  With this comprehensive rehabilitation program, you will be expected to participate in at least 3 hours of rehabilitation therapies Monday-Friday, with modified therapy programming on the weekends.  Your rehabilitation program will include the following services:  Physical Therapy (PT), Occupational Therapy (OT), Speech Therapy (ST), 24 hour per day rehabilitation nursing, Therapeutic Recreation (TR), Neuropsychology, Case Management (Social Worker), Rehabilitation Medicine, Nutrition Services and Pharmacy Services  Weekly team conferences will be held on  toWednesdays discuss your progress.  Your Social Worker will talk with you frequently to get your input and to update you on team discussions.  Team conferences with you and your family in attendance may also be held.  Expected length of stay:   3 weeks depending on discharge plan Overall anticipated outcome:  Moderate to maximum assistance  Depending on your progress and recovery, your program may change. Your Social Worker will coordinate services and will keep you informed of any changes. Your Social Worker's name and contact numbers are listed  below.  The following services may also be recommended but are not provided by the [facility] will be made to provide these services after discharge if needed.  Arrangements include referral to agencies that provide these services.  Your insurance has been verified to be:  Medicare and Medicaid Your primary doctor is:  [facility] [employer]  Pertinent information  will be shared with your doctor and your insurance company.  Social Worker:  Alfonse Alpers, LCSW  [phone] or ([phone]  Information discussed with and copy given to patient by: Silvestre Mesi [name], 08/31/2013, 7:16 PM

## 2013-08-31 NOTE — Progress Notes (Signed)
Occupational Therapy Session Note  Patient Details  Name: [name] MRN: [id number] Date of Birth: [birth year]  Today's Date: 08/31/2013 Time: 4128-7867 Time Calculation (min): 45 min  Skilled Therapeutic Interventions/Progress Updates:    Pt transferred from the wheelchair to the therapy mat with total assist +2 (pt 30%) using the sliding board.  Worked on static and dynamic sitting balance EOM.  Pt's daughter involved in treatment helping to place and hold the the sliding board as well as holding objects for pt to reach for.  Worked on having pt alternate and reach forward for balls that his daughter was holding.  Pt needing max facilitation to reach forward and then back to midline secondary to decreased activation of lumbar extensors.  Pt needing mod facilitation in the LUE to reach forward to knee level for grasping ball.  Max assist needed for using the RUE.  Transitioned to working on scooting back to the wheelchair via the slidingboard as well.  Pt needs max demonstrational cueing to utilize his LUE for pushing across the board.  Pt still only able to assist 10% with total assist +2.    Therapy Documentation Precautions:  Precautions Precautions: Fall Precaution Comments: For change in neurologic condition, patient c/o severe headache, or nausea and vomiting develops.  SBP > 160 (not controlled by prn medications) or less than 100, DBP > 110 or less than 50, Temperature > 100.5 F, Heart rate > 120 or < 50, blood glucose > 180 or < 60 mg/dl; Air mattress--SR x 4, bilat PREVALON boots in bed Restrictions Weight Bearing Restrictions: No  Pain: Pain Assessment Pain Assessment: No/denies pain Pain Score: 5  Pain Type: Chronic pain Pain Location: Ankle Pain Orientation: Left Pain Descriptors / Indicators: Sore Pain Onset: Other (Comment) (touch and WB) Pain Intervention(s): Other (Comment) (wrapped with ace wrap) ADL: See FIM for current functional status  Therapy/Group: Individual  Therapy  Cindra Presume OTR/L 08/31/2013, 3:50 PM

## 2013-08-31 NOTE — Progress Notes (Signed)
Physical Therapy Session Note  Patient Details  Name: [name] MRN: [id number] Date of Birth: [birth year]  Today's Date: 08/31/2013 Time: 1100-1154 Time Calculation (min): 54 min  Short Term Goals: Week 1:  PT Short Term Goal 1 (Week 1): Pt will perform bed mobility with max A of one person PT Short Term Goal 2 (Week 1): Pt will perform bed <> w/c transfers with max A of one person PT Short Term Goal 3 (Week 1): Pt will perform dynamic sitting balance activities with mod A x 10 minutes PT Short Term Goal 4 (Week 1): Pt will tolerated supported standing (standing frame or tilt table) for LE and core strengthening x 5 minutes with total A  Skilled Therapeutic Interventions/Progress Updates:   Pt resting in bed; pt performed rolling and supine >sit with max-total A.  Performed slideboard transfer bed >w/c with +2 A with total cues for hand placement and sequence with extra time to allow pt to push and initiate sliding his buttocks across the board.  In day room pt set up in [place]; performed sit > stand and static standing in [place] with +2 A for 10 seconds at a time with L ankle ace wrapped for support to minimize pain and verbal and tactile cues to maintain upright posture, to bring COG over BOS and assistance to prevent knee buckling (Bilat LE in full ER and genu recurvatum for stability).  Returned to sitting in w/c with LE in semi circle sitting with LE on ELR with focus on trunk control and core activation while leaning fully forward walking UE down legs to touch ankles and feet x 3-4 reps with min-mod A.  Performed w/c mobility training for UE strengthening x 50' with min A for sequencing and extra time secondary to UE ROM.  Returned to room and pt set up in w/c with all items within reach.     Therapy Documentation Precautions:  Precautions Precautions: Fall Precaution Comments: For change in neurologic condition, patient c/o severe headache, or nausea and vomiting develops.  SBP > 160  (not controlled by prn medications) or less than 100, DBP > 110 or less than 50, Temperature > 100.5 F, Heart rate > 120 or < 50, blood glucose > 180 or < 60 mg/dl; Air mattress--SR x 4, bilat PREVALON boots in bed Restrictions Weight Bearing Restrictions: No Pain: Pain Assessment Pain Assessment: 0-10 Pain Score: 5  Faces Pain Scale: Hurts worst Pain Type: Chronic pain Pain Location: Ankle Pain Orientation: Left Pain Descriptors / Indicators: Sore Pain Onset: Other (Comment) (touch and WB) Patients Stated Pain Goal: 3 Pain Intervention(s): Other (Comment) (wrapped with ace wrap)  See FIM for current functional status  Therapy/Group: Individual Therapy  [name] 08/31/2013, 12:48 PM

## 2013-08-31 NOTE — IPOC Note (Signed)
Overall Plan of Care [facility]) Patient Details Name: [name] MRN: [id number] DOB: 1951-04-09  Admitting Diagnosis: ICH, embolic infarcta and post polio syndrome  Hospital Problems: Principal Problem:   ICH (intracerebral hemorrhage) Active Problems:   Acute embolic stroke   HTN (hypertension)   Post-poliomyelitis muscular atrophy     Functional Problem List: Nursing    PT Balance;Endurance;Motor;Perception  OT Balance;Cognition;Endurance;Motor;Safety  SLP Linguistic;Cognition;Safety  TR Endurance;Motor;Perception;Safety;Other (comment) (fearful of falling)       Basic ADL's: OT Eating;Grooming;Bathing;Dressing;Toileting     Advanced  ADL's: OT       Transfers: PT Bed Mobility;Bed to Chair;Car  OT Toilet;Tub/Shower     Locomotion: PT Ambulation     Additional Impairments: OT Fuctional Use of Upper Extremity  SLP Communication;Social Cognition expression;comprehension Problem Solving;Memory;Awareness  TR      Anticipated Outcomes Item Anticipated Outcome  Self Feeding supervision  Swallowing      Basic self-care  mod assist  Toileting  max assist   Bathroom Transfers max assist.  Bowel/Bladder     Transfers  mod A  Locomotion  max A with therapy only; pt w/c level for home  Communication  Min-Mod assist  Cognition  Mod assist  Pain     Safety/Judgment      Therapy Plan: PT Intensity: Minimum of 1-2 x/day ,45 to 90 minutes PT Frequency: 5 out of 7 days PT Duration Estimated Length of Stay: 18-21 days OT Intensity: Minimum of 1-2 x/day, 45 to 90 minutes OT Frequency: 5 out of 7 days OT Duration/Estimated Length of Stay: 21 days SLP Intensity: Minumum of 1-2 x/day, 30 to 90 minutes SLP Frequency: 5 out of 7 days SLP Duration/Estimated Length of Stay: 18-21 days       Team Interventions: Nursing Interventions    PT interventions Ambulation/gait training;Balance/vestibular training;Cognitive remediation/compensation;Discharge  planning;Disease management/prevention;DME/adaptive equipment instruction;Functional mobility training;Neuromuscular re-education;Patient/family education;Skin care/wound management;Splinting/orthotics;Therapeutic Activities;Therapeutic Exercise;UE/LE Strength taining/ROM;Visual/perceptual remediation/compensation;UE/LE Coordination activities;Psychosocial support  OT Interventions Balance/vestibular training;Cognitive remediation/compensation;DME/adaptive equipment instruction;Functional mobility training;Discharge planning;Psychosocial support;Therapeutic Activities;Visual/perceptual remediation/compensation;UE/LE Coordination activities;Splinting/orthotics;Patient/family education;Functional electrical stimulation;Community reintegration;Pain management;UE/LE Strength taining/ROM;Self Care/advanced ADL retraining;Neuromuscular re-education;Therapeutic Exercise  SLP Interventions Cueing hierarchy;Cognitive remediation/compensation;Environmental controls;Functional tasks;Internal/external aids;Patient/family education;Speech/Language facilitation;Therapeutic Activities  TR Interventions Adaptive equipment instruction;1:1 session;Balance/vestibular training;Functional mobility training;Cognitive remediation/compensation;Patient/family education;Therapeutic activities;Recreation/leisure participation;Therapeutic exercise;UE/LE Coordination activities;Wheelchair propulsion/positioning  SW/CM Interventions Discharge Planning;Psychosocial Support;Patient/Family Education    Team Discharge Planning: Destination: PT-Home (vs SNF if 24 assistance not available) ,OT- Home , SLP- (TBD) Projected Follow-up: PT-Home health PT;24 hour supervision/assistance, OT-  24 hour supervision/assistance;Home health OT, SLP-24 hour supervision/assistance Projected Equipment Needs: PT-Rolling walker with 5" wheels;Wheelchair (measurements);Wheelchair cushion (measurements);To be determined, OT- Sliding board;Tub/shower  bench;Other (comment), SLP-None recommended by SLP Equipment Details: PT- , OT-drop arm commmode, hospital bed Patient/family involved in discharge planning: PT- Patient,  OT-Patient unable/family or caregiver not available, SLP-Patient (with intrepreter)  MD ELOS: 18-21 days Medical Rehab Prognosis:  Excellent Assessment: The patient has been admitted for CIR therapies with the diagnosis of ICH with hx of post polio. The team will be addressing functional mobility, strength, stamina, balance, safety, adaptive techniques and equipment, self-care, bowel and bladder mgt, patient and caregiver education, NMR, cognition, communication, swallowing, pain mgt, orthotics. Goals have been set at mod to max assist with mobility and self-care and min to mod assist with cognition   Meredith Staggers, MD, [facility]      See Team Conference Notes for weekly updates to the plan of care

## 2013-08-31 NOTE — Progress Notes (Signed)
Social Work Patient ID: [name], male   DOB: 1950/09/06, 63 y.o.   MRN: [id number]  CSW talked with pt's adopted dtr at length after team conference and she is deferring d/c planning to pt's wife, as this is pt's wish.  Dtr wants to be involved, but there are some family dynamics that keep her from being fully involved, although she visits pt regularly.  She also lives an hour away and has a family, so she cannot be with pt 24/7.  CSW will spoke with pt and wife finally today and wife to investigate any leave for which she is eligible at work in order to care for pt.  CSW explained that otherwise, pt will need SNF.  CSW emphasized multiple times how much care pt will require and that d/c date has not been set yet, but that it would be about 3 weeks unless plan is for SNF and then it could be sooner.  She expressed understanding and will call CSW next week to discuss further.  CSW will continue to follow and assist as needed.

## 2013-08-31 NOTE — Progress Notes (Signed)
Speech Language Pathology Daily Session Note  Patient Details  Name: [name] MRN: [id number] Date of Birth: 1951/03/26  Today's Date: 08/31/2013 Time: 1010-1035 Time Calculation (min): 25 min  Short Term Goals: Week 1: SLP Short Term Goal 1 (Week 1): Patient will utilize external aids for orientation with Mod assist  SLP Short Term Goal 2 (Week 1): Patient will express phrase lenght utterances with Mod sentence completion and question cues  Skilled Therapeutic Interventions: Skilled treatment session focused on addressing communication goals.  Interpreter present for session.  Patient was presented with pictures and was asked to verbally describe what people were doing.  SLP facilitated session with Mod question and sentence completion cues to provide 2-4 word responses.   Recommend patient practice description in isolation with objects next session.     FIM:  Comprehension Comprehension Mode: Auditory Comprehension: 3-Understands basic 50 - 74% of the time/requires cueing 25 - 50%  of the time Expression Expression Mode: Verbal Expression: 2-Expresses basic 25 - 49% of the time/requires cueing 50 - 75% of the time. Uses single words/gestures. Social Interaction Social Interaction: 3-Interacts appropriately 50 - 74% of the time - May be physically or verbally inappropriate. Problem Solving Problem Solving: 2-Solves basic 25 - 49% of the time - needs direction more than half the time to initiate, plan or complete simple activities Memory Memory: 2-Recognizes or recalls 25 - 49% of the time/requires cueing 51 - 75% of the time FIM - Eating Eating Activity: 4: Help with picking up utensils  Pain Pain Assessment Pain Assessment: Yes Pain Score: 5  Faces Pain Scale: Hurts worst Pain Type: Chronic pain Pain Location: Ankle Pain Orientation: Left Pain Descriptors / Indicators: Sore Pain Onset: Other (Comment) (touch and WB) Patients Stated Pain Goal: 3 Pain Intervention(s):  Other (Comment) (wrapped with ace wrap)  Therapy/Group: Individual Therapy  [name]., CCC-SLP [place] 08/31/2013, 1:14 PM

## 2013-08-31 NOTE — Patient Care Conference (Signed)
Inpatient RehabilitationTeam Conference and Plan of Care Update Date: 08/29/2013   Time: 11:30 AM    Patient Name: [name]      Medical Record Number: [id number]  Date of Birth: 30-Oct-1950 Sex: Male         Room/Bed: 4W26C/4W26C-01 Payor Info: Payor: MEDICARE / Plan: MEDICARE PART A AND B / Product Type: *No Product type* /    Admitting Diagnosis: ICH, embolic infarcta and post polio syndrome  Admit Date/Time:  08/28/2013  6:05 PM Admission Comments: No comment available   Primary Diagnosis:  ICH (intracerebral hemorrhage) Principal Problem: ICH (intracerebral hemorrhage)  Patient Active Problem List   Diagnosis Date Noted  . HTN (hypertension) 08/29/2013  . 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 Acute embolic stroke 67/89/3810  . ICH (intracerebral hemorrhage) 08/22/2013  . Stroke 08/22/2013    Expected Discharge Date:  To be set at the next conference.  Pt was a new eval at time of conference.  Team Members Present: Physician leading conference: Dr. Alysia Penna Social Worker Present: Alfonse Alpers, LCSW Nurse Present: Elaina Pattee, RN PT Present: Raylene Everts, PT OT Present: Clyda Greener, OT SLP Present: Gunnar Fusi, SLP PPS Coordinator present : Daiva Nakayama, RN, CRRN     Current Status/Progress Goal Weekly Team Focus  Medical   paraparesis and RUE weakness, post polio with superimposed CVA  maximize functional independence  identify premorbid problems with mob   Bowel/Bladder   Incontinent of bowel and bladder; condom cath at night  Pt will be able to call for assistance when he needs to use the bathroom  Encourage pt to call for toileting needs and monitor and Q2hr monitoring    Swallow/Nutrition/ Hydration   eval pending         ADL's   Mod assist for UB bathing and total assist for LB dressing.  Total assist for transfers via sliding board.  Decreased bilateral shoulder flexion with the right being slightly more impaired than the left.   mod to  max assist overall  selfcare retraining, balance retraining, neuromuscular re-education, pt/family education   Mobility   total A +2   min-mod A transfers; max A to stand and ambulate with therapy only  Postural control/balance, tranfers, supported standing   Communication   eval pending         Safety/Cognition/ Behavioral Observations  eval pending         Pain   Pain in left foot; PRN tylenol 650mg  and tramadol 50mg  ordered   Pain level to be a 2 or less on the faces scale   Assess for pain Qshift and PRN   Skin   There are currently no skin issues  Pt is to remain free of skin breakdown and issues  Asses skin Q shift and PRN    Rehab Goals Patient on target to meet rehab goals: Yes Rehab Goals Revised: Pt's first conference.  This is eval day. *See Care Plan and progress notes for long and short-term goals.  Barriers to Discharge: see above    Possible Resolutions to Barriers:  adaptive equipment    Discharge Planning/Teaching Needs:  Pt's d/c plan is unknown at this time. CSW to f/u with pt's wife to determine d/c plan for pt.  To be determined once d/c plan is determined.   Team Discussion:  Pt has a lot of difficulty with his right upper extremity post-polio and believes team his left upper extremity is affected by the strokes.  Many of pt's problems on  his right side are chronic from pt having had polio.  Pt experienced high blood pressure just at the side of the bed.  Therapists feel pt will have moderate to maximum level goals and wonder who will be caring for pt at d/c.  CSW to meet with pt/family to discuss this.  Revisions to Treatment Plan:  None - pt just being evaluated   Continued Need for Acute Rehabilitation Level of Care: The patient requires daily medical management by a physician with specialized training in physical medicine and rehabilitation for the following conditions: Daily direction of a multidisciplinary physical rehabilitation program to ensure safe  treatment while eliciting the highest outcome that is of practical value to the patient.: Yes Daily medical management of patient stability for increased activity during participation in an intensive rehabilitation regime.: Yes Daily analysis of laboratory values and/or radiology reports with any subsequent need for medication adjustment of medical intervention for : Neurological problems;Other  Silvestre Mesi Anaston Koehn 09/04/2013, 9:08 AM

## 2013-08-31 NOTE — Progress Notes (Signed)
Occupational Therapy Session Note  Patient Details  Name: [name] MRN: [id number] Date of Birth: 1950/09/30  Today's Date: 08/31/2013 Time: 2878-6767 Time Calculation (min): 58 min  Short Term Goals: Week 1:  OT Short Term Goal 1 (Week 1): Pt will maintain static sitting EOB with min assist for 3 mins in preparation for selfcare tasks. OT Short Term Goal 2 (Week 1): Pt will self feed using the LUE primarily with AE and supervision. OT Short Term Goal 3 (Week 1): Pt will perform 3 grooming tasks in supported sitting at the sink with supervision. OT Short Term Goal 4 (Week 1): Pt will perform UB bathing with min assist in supported sitting.  OT Short Term Goal 5 (Week 1): Pt will perform toilet transfers to drop arm commode vs commode bench with total assist using sliding board.   Skilled Therapeutic Interventions/Progress Updates:    Pt seen for am session with focus on bathing and dressing sitting EOB.  Translator also present for interpretation of instructions.  Transferred pt from supine to sit EOB with total assist +2 (pt 20%).  Worked on sitting balance EOB during bathing task.  Pt demonstrates increased posterior bias with LOB in sitting and resists forward weightshift for self correction.  Overall mod assist needed to maintain balance while washing his UB.  Positioned washpan and cloth on bedside chair to encourage forward trunk flexion while reaching for the washcloth with the LUE.  Pt still needs mod facilitation at the left shoulder to reach the water and then mod assist to regain balance to midline.  He is able to wash his most of his UB but needs max hand over hand assistance to integrate the RUE for washing the left arm.  Pt demonstrates increased fear of forward trunk flexion for washing LEs so therapist provided max assist to cross each UE up over the opposite knee for bathing and donning gripper socks.  At this point family has not brought in clothing so provided him with new  gowns.  Did not attempt washing peri area as nursing had performed this previously and applied new brief.  Pt transferred to bedside chair with use of the sliding board with total assist (pt 10%) to bedside chair.  Max demonstrational cueing to sequence task including attempted hand placement with the LUE and for forward trunk flexion to unweight his bottom.  In the wheelchair had pt work on self feeding.  He is able to use his mug with lid and straw with supervision in the LUE.  Therapist attempted to prop his UEs on the bedside table secondary to bilateral shoulder weakness and provided foam grips to place over the utensils.  Pt needing max assist to cut up his food but he is able to use the stabbing method with the fork and then bring to mouth.  Pt may benefit from trial with a rocker knife as well.   Therapy Documentation Precautions:  Precautions Precautions: Fall Precaution Comments: For change in neurologic condition, patient c/o severe headache, or nausea and vomiting develops.  SBP > 160 (not controlled by prn medications) or less than 100, DBP > 110 or less than 50, Temperature > 100.5 F, Heart rate > 120 or < 50, blood glucose > 180 or < 60 mg/dl; Air mattress--SR x 4, bilat PREVALON boots in bed Restrictions Weight Bearing Restrictions: No  Pain: Pain Assessment Pain Assessment: 0-10 Pain Score: 10-Worst pain ever (per intrepter) Faces Pain Scale: Hurts worst Pain Type: Chronic pain Pain Location: Foot  Pain Orientation: Left Pain Descriptors / Indicators: Aching;Sore;Tender Pain Onset: On-going Patients Stated Pain Goal: 3 Pain Intervention(s): Medication (See eMAR);Repositioned (ultram 50 mg po) ADL: See FIM for current functional status  Therapy/Group: Individual Therapy  Cindra Presume OTR/L 08/31/2013, 12:30 PM

## 2013-08-31 NOTE — Progress Notes (Signed)
Social Work Assessment and Plan  Patient Details  Name: [name] MRN: [id number] Date of Birth: October 13, 1950  Today's Date: 08/31/2013  Problem List:  Patient Active Problem List   Diagnosis Date Noted  . HTN (hypertension) 08/29/2013  . 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 Acute embolic stroke 57/84/6962  . ICH (intracerebral hemorrhage) 08/22/2013  . Stroke 08/22/2013   Past Medical History:  Past Medical History  Diagnosis Date  . Hypertension   . Polio    Past Surgical History: No past surgical history on file. Social History:  reports that he has been smoking Cigarettes.  He has been smoking about 0.00 packs per day. He does not have any smokeless tobacco history on file. He reports that he does not drink alcohol or use illicit drugs.  Family / Support Systems Marital Status: Married Patient Roles: Spouse;Parent Spouse/Significant Other: Kenard Gower - wife- [phone] (c) [phone] (w) Children: Virgie Dad - dtr- [phone]   [place] -37 y/o son - [phone] Anticipated Caregiver: unknown at this time Ability/Limitations of Caregiver: Wife works,  Dtr lives an hour away.  She cannot provide 24/7 assistance.   Caregiver Availability: Intermittent Family Dynamics: very complex family dynamics between the wife and the adopted dtr and the pt  Social History Preferred language: English Religion: Non-Denominational Cultural Background: Pt speaks [place] Employment Status: Disabled Public relations account executive Issues: None reported Guardian/Conservator: None   Abuse/Neglect Physical Abuse: Denies Verbal Abuse: Denies Sexual Abuse: Denies Exploitation of patient/patient's resources: Denies Self-Neglect: Denies  Emotional Status Pt's affect, behavior and adjustment status: Pt was very tearful while CSW was in the room.  He could not identify why, but it was when CSW spoke of his dtr, [name], and as we talked with the wife about pt's d/c plan. Recent Psychosocial  Issues: Family dynamics.  Pt cannot sign any legal paperwork while in the hospital.  MD has stated that pt cannot make decisions at this time.  Staff should be alert for people trying to have him sign paperwork. Pyschiatric History: Not assessed at this time Substance Abuse History: Not assessed at this time  Patient / Family Perceptions, Expectations & Goals Pt/Family understanding of illness & functional limitations: Wife stated she understood pt's longterm care needs. Dtr is aware of complex situation and pt's needs at d/c. 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 Anticipated changes in roles/activities/participation: Pt will need assistance with all tasks. Pt/family expectations/goals: Pt wants to get better/stronger.  Community Resources Express Scripts: None Premorbid Home Care/DME Agencies: None Transportation available at discharge: family Resource referrals recommended: Neuropsychology  Discharge Planning Living Arrangements: Spouse/significant other;Children Support Systems: Spouse/significant other;Children;Other relatives Type of Residence: Private residence Insurance Resources: Medicare;Medicaid (specify county) Sports coach) Financial Resources: SSD Financial Screen Referred: No Living Expenses: Higher education careers adviser Management: Patient Does the patient have any problems obtaining your medications?: No Home Management: Pt's wife. Patient/Family Preliminary Plans: Unknown at this time.  Wife was presented options today and she will get back with CSW next week. Social Work Anticipated Follow Up Needs: HH/OP;SNF Expected length of stay: 20-28 days  Clinical Impression CSW met with pt with the interpreter to introduce self and role of CSW and to complete assessment.  Pt's dtr came in during visit and was very appreciative of help.  CSW also spoke with pt's wife about the plan for pt when he leaves CIR via the  interpreter.  She will discuss with her employer if she has any leave and make  a decision.  She is concerned about not having an income if she leaves work to care for him, but she asked what will happen to pt's check if he goes to SNF and CSW explained that check will go toward his care except for $35.  CSW to talk with her next week.  Pt was tearful as we discussed all of this.  Dtr was very attentive to him while cried.  CSW will continue to follow and assist with pt's d/c as needed.  Silvestre Mesi Lajuan Godbee 08/31/2013, 2:14 PM

## 2013-08-31 NOTE — Progress Notes (Signed)
Subjective/Complaints: 63 y.o. 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 CT head with acute Intraparenchymal hemorrhage right basal ganglia. He had increase in lethargy on 05/14 and follow up CCT with additional multifocal ischemic infarcts involving bilateral parietal and temporal lobes as well as right occipital lobe. 2D echo with EF 65-70% with no wall abnormality. Carotid dopplers without ICA stenosis. Patient with resultant paraparesis as well as LUE weakness, apraxia, decreased verbal output with naming errors as well as difficulty with multistep commands  No problems noted by staff  Review of Systems - limited, cannot obtain secondary to language and cognition Objective: Vital Signs: Blood pressure 166/85, pulse 66, temperature 98.1 F (36.7 C), temperature source Oral, resp. rate 18, height 5' (1.524 m), weight 56.7 kg (125 lb), SpO2 95.00%. No results found. Results for orders placed during the hospital encounter of 08/28/13 (from the past 72 hour(s))  COMPREHENSIVE METABOLIC PANEL     Status: Abnormal   Collection Time    08/29/13  5:44 AM      Result Value Ref Range   Sodium 140  137 - 147 mEq/L   Potassium 4.6  3.7 - 5.3 mEq/L   Chloride 104  96 - 112 mEq/L   CO2 23  19 - 32 mEq/L   Glucose, Bld 106 (*) 70 - 99 mg/dL   BUN 19  6 - 23 mg/dL   Creatinine, Ser 0.76  0.50 - 1.35 mg/dL   Calcium 9.3  8.4 - 10.5 mg/dL   Total Protein 7.2  6.0 - 8.3 g/dL   Albumin 2.9 (*) 3.5 - 5.2 g/dL   AST 29  0 - 37 U/L   ALT 31  0 - 53 U/L   Alkaline Phosphatase 52  39 - 117 U/L   Total Bilirubin 0.9  0.3 - 1.2 mg/dL   GFR calc non Af Amer >90  >90 mL/min   GFR calc Af Amer >90  >90 mL/min   Comment: (NOTE)   The eGFR has been calculated using the CKD EPI equation.     This calculation has not been validated in all clinical situations.     eGFR's persistently <90 mL/min signify possible Chronic Kidney     Disease.  CBC WITH DIFFERENTIAL     Status: None   Collection Time    08/29/13  5:44 AM      Result Value Ref Range   WBC 7.5  4.0 - 10.5 K/uL   RBC 4.49  4.22 - 5.81 MIL/uL   Hemoglobin 13.8  13.0 - 17.0 g/dL   HCT 40.1  39.0 - 52.0 %   MCV 89.3  78.0 - 100.0 fL   MCH 30.7  26.0 - 34.0 pg   MCHC 34.4  30.0 - 36.0 g/dL   RDW 13.2  11.5 - 15.5 %   Platelets 177  150 - 400 K/uL   Neutrophils Relative % 68  43 - 77 %   Neutro Abs 5.1  1.7 - 7.7 K/uL   Lymphocytes Relative 18  12 - 46 %   Lymphs Abs 1.4  0.7 - 4.0 K/uL   Monocytes Relative 11  3 - 12 %   Monocytes Absolute 0.8  0.1 - 1.0 K/uL  Eosinophils Relative 3  0 - 5 %   Eosinophils Absolute 0.2  0.0 - 0.7 K/uL   Basophils Relative 0  0 - 1 %   Basophils Absolute 0.0  0.0 - 0.1 K/uL      General: No acute distress Mood and affect are appropriate Heart: Regular rate and rhythm no rubs murmurs or extra sounds Lungs: Clear to auscultation, breathing unlabored, no rales or wheezes Abdomen: Positive bowel sounds, soft nontender to palpation, nondistended Extremities: no edema, atrophy in RUE with fasiculations in EDC, BLE atrophy in thigh , leg and feet Skin: No evidence of breakdown, no evidence of rash Neurologic: Cranial nerves II through XII intact, motor strength is 0/5 in RUE 4/5 in left deltoid, bicep, tricep, grip, 3-/5 bilateral hip flexor,0/5 in Bilateral  knee extensors, ankle dorsiflexor and plantar flexor, increased tone LUE, decreased tone RUE and BLE Sensory exam limited by language  Musculoskeletal: Full passive range of motion in all 4 extremities.Frog leg posture  No joint swelling   Assessment/Plan: 1. 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 Physiatrist is providing close team supervision and 24 hour management of active medical problems listed below. Physiatrist and rehab team continue to assess barriers to discharge/monitor patient progress toward functional and medical goals. FIM: FIM - Bathing Bathing Steps Patient Completed: Chest;Right Arm;Abdomen;Front perineal area;Right upper leg;Left upper leg Bathing: 1: Two helpers  FIM - Upper Body Dressing/Undressing Upper body dressing/undressing: 0: Wears gown/pajamas-no public clothing FIM - Lower Body Dressing/Undressing Lower body dressing/undressing: 1: Two helpers  FIM - Toileting Toileting: 0: Activity did not occur  FIM - Radio producer Devices:  (commode bench) Toilet Transfers: 1-Two helpers  FIM - Control and instrumentation engineer Devices: Bed rails;Arm rests;Sliding board Bed/Chair Transfer: 1: Mechanical lift  FIM - Locomotion: Wheelchair Locomotion: Wheelchair: 0: Activity did not occur FIM - Locomotion: Ambulation Locomotion: Ambulation: 0: Activity did not occur  Comprehension Comprehension Mode: Auditory Comprehension: 3-Understands basic 50 - 74% of the time/requires cueing 25 - 50%  of the time  Expression Expression Mode: Verbal Expression: 2-Expresses basic 25 - 49% of the time/requires cueing 50 - 75% of the time. Uses single words/gestures.  Social Interaction Social Interaction: 3-Interacts appropriately 50 - 74% of the time - May be physically or verbally inappropriate.  Problem Solving Problem Solving: 2-Solves basic 25 - 49% of the time - needs direction more than half the time to initiate, plan or complete simple activities  Memory Memory: 1-Recognizes or recalls less than 25% of the time/requires cueing greater than 75% of the time   Medical Problem List and Plan:   1. Functional deficits secondary to [place], embolic infarcts in a patient with post-polio syndrome  2. DVT Prophylaxis/Anticoagulation: Pharmaceutical: Lovenox  3. Pain Management: Used naprosyn bid at home. May need to schedule tylenol--monitor for now.  4. Mood: Provide ego support. LCSW to follow for evaluation and support.  5. Neuropsych: This patient is is not capable of making decisions on his own behalf. Defer financial decisions to POA 6. HTN: Will monitor BP every 8 hours and adjust medication as needed for tighter control. Continue lisinopril and HCTZ  7. Bilateral patchy infiltrates exam negative, xrays with poor inspiration, WBC normal. D/C levaquin and monitor 8. 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 Routine pressure relief measures-- will order air mattress overlay.   LOS (Days) 3 A FACE TO FACE EVALUATION WAS PERFORMED  [name] 08/31/2013, 8:07 AM

## 2013-09-01 ENCOUNTER — Inpatient Hospital Stay (HOSPITAL_COMMUNITY): Payer: Medicare Other | Admitting: *Deleted

## 2013-09-01 ENCOUNTER — Inpatient Hospital Stay (HOSPITAL_COMMUNITY): Payer: Medicare Other | Admitting: Occupational Therapy

## 2013-09-01 ENCOUNTER — Inpatient Hospital Stay (HOSPITAL_COMMUNITY): Payer: Medicare Other | Admitting: Speech Pathology

## 2013-09-01 MED ORDER — LISINOPRIL 40 MG PO TABS
40.0000 mg | ORAL_TABLET | Freq: Every day | ORAL | Status: DC
  Administered 2013-09-02 – 2013-09-14 (×13): 40 mg via ORAL
  Filled 2013-09-01 (×14): qty 1

## 2013-09-01 NOTE — Progress Notes (Signed)
Subjective/Complaints: 63 y.o. 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 CT head with acute Intraparenchymal hemorrhage right basal ganglia. He had increase in lethargy on 05/14 and follow up CCT with additional multifocal ischemic infarcts involving bilateral parietal and temporal lobes as well as right occipital lobe. 2D echo with EF 65-70% with no wall abnormality. Carotid dopplers without ICA stenosis. Patient with resultant paraparesis as well as LUE weakness, apraxia, decreased verbal output with naming errors as well as difficulty with multistep commands  Nursing staff reports no issues. The patient does not speaking with. Objective: Vital Signs: Blood pressure 190/98, pulse 68, temperature 98.1 F (36.7 C), temperature source Oral, resp. rate 17, height 5' (1.524 m), weight 125 lb (56.7 kg), SpO2 99.00%.  Frail elderly male in no acute distress. Chest her auscultation. Cardiac exam S1-S2 are regular. Abdominal exam active bowel sounds, soft her extremities no edema.  Medical Problem List and Plan:  1. Functional deficits secondary to [place], embolic infarcts in a patient with post-polio syndrome  2. DVT Prophylaxis/Anticoagulation: Pharmaceutical: Lovenox  3. Pain Management: Used naprosyn bid at home. May need to schedule tylenol--monitor for now.  4. Mood: Provide ego support. LCSW to follow for evaluation and support.  5. Neuropsych: This patient is is not capable of making decisions on his own behalf. Defer financial decisions to POA 6. HTN: Blood pressure range is 142-190/80-98. Will increase lisinopril. 8. Post polio syndrome with chronic right upper and bilateral lower weakness: Has chronic dry pressure  ulcer on right lateral knee. Routine pressure relief measures-- will order air mattress overlay.   LOS (Days) 4 A FACE TO FACE EVALUATION WAS PERFORMED  Maurie Olesen H Jameisha Stofko 09/01/2013, 9:50 AM

## 2013-09-01 NOTE — Progress Notes (Signed)
Physical Therapy Session Note  Patient Details  Name: [name] MRN: [id number] Date of Birth: 04-30-1950  Today's Date: 09/01/2013  Skilled Therapeutic Interventions/Progress Updates:  Session I 1100-1155 (55 min ) Patient just finished his OT session, transferred to w/c ,agrees to therapy ,interpretor present during the entire session. Standing frame 3 attempts prior to successful trial ,mainly due to increased pain in L ankle. Joint has been wrapped with ace wrap for proprioception and stability. Patient not able to stand up weight bearing through it, patient knee has been held in the air to achieve standing in the frame and air disc applied under extremity during standing to reduce pressure on it.As standing progressed patient was able to shift more weight onto L side. Standing for 5 min.  3 x sliding board transfer training to /from w/c to mat with max A of 2, patient has difficulty with organization, sequencing of transfer, decreased response to verbal and tactile cues.  Sitting EOM with min-mod A to increase balance and activity tolerance, patient performed activity-punching the bag -to facilitate core muscle involvement in rightening reactions and to weight shift in sitting, patient is able to straighten up in sitting 25% of time and needs manual cues to do so.  Patient returned to room ,QR belt on, lap tray in place and all needs within reach. Patient complained of L ankle pain through the session. Nursing notified, also suggested air cast of Prevlone boots to decrease pressure form foot rests and assure skin integrity.  Session II 1313-1411 40 min Patient in bed , refuses to transfer to w/c, session focused on sitting EOB balance and rightning reactions, reaching for objects and weight shifting all activities with mod to max A. TE in supine in AA manner. Hip flexion 2 x 15, quad sets 2 x 15 , ankle pumps 2 x 15 Patient left in bed all needs within reach. Therapy  Documentation Precautions:  Precautions Precautions: Fall Precaution Comments: For change in neurologic condition, patient c/o severe headache, or nausea and vomiting develops.  SBP > 160 (not controlled by prn medications) or less than 100, DBP > 110 or less than 50, Temperature > 100.5 F, Heart rate > 120 or < 50, blood glucose > 180 or < 60 mg/dl; Air mattress--SR x 4, bilat PREVALON boots in bed Restrictions Weight Bearing Restrictions: No  See FIM for current functional status  Therapy/Group: Individual Therapy  [place] Spanish 09/01/2013, 12:32 PM

## 2013-09-01 NOTE — Progress Notes (Signed)
Occupational Therapy Session Note  Patient Details  Name: [name] MRN: [id number] Date of Birth: May 16, 1950  Today's Date: 09/01/2013 Time: 1000-1100 Time Calculation (min): 60 min  Short Term Goals: Week 1:  OT Short Term Goal 1 (Week 1): Pt will maintain static sitting EOB with min assist for 3 mins in preparation for selfcare tasks. OT Short Term Goal 2 (Week 1): Pt will self feed using the LUE primarily with AE and supervision. OT Short Term Goal 3 (Week 1): Pt will perform 3 grooming tasks in supported sitting at the sink with supervision. OT Short Term Goal 4 (Week 1): Pt will perform UB bathing with min assist in supported sitting.  OT Short Term Goal 5 (Week 1): Pt will perform toilet transfers to drop arm commode vs commode bench with total assist using sliding board.   Skilled Therapeutic Interventions/Progress Updates:    Pt seen this session to facilitate functional mobility and self care skills with a focus on trunk control, postural awareness, activity tolerance.  Pt was seen by OT and rehab tech this am to provide +2 assistance needed with sitting and transfers. Pt worked on rolling in bed for LB care with facilitation to engage internal hip rotators, abdominals and head to facilitate rolling. With rails pt was able to roll with mod A to facilitate the legs. Pt sat to EOB with max A and then total A as he has a strong posterior lean.  Total assist to reach arms and flex trunk forward. Initially, pt worked on simple forward trunk flexion with reaching prior to UB bathing and dressing. SB transfer x2 to wc with pt assisting 10%  Pt may benefit from an aircast to L ankle for additional support. Pt in chair brushing teeth when his next therapist arrived.    Therapy Documentation Precautions:  Precautions Precautions: Fall Precaution Comments: For change in neurologic condition, patient c/o severe headache, or nausea and vomiting develops.  SBP > 160 (not controlled by prn  medications) or less than 100, DBP > 110 or less than 50, Temperature > 100.5 F, Heart rate > 120 or < 50, blood glucose > 180 or < 60 mg/dl; Air mattress--SR x 4, bilat PREVALON boots in bed Restrictions Weight Bearing Restrictions: No   Pain: Pain Assessment Pain Assessment: Faces Faces Pain Scale: Hurts even more Pain Type: Chronic pain Pain Location: Ankle Pain Orientation: Left Pain Intervention(s): Repositioned ADL:   See FIM for current functional status  Therapy/Group: Individual Therapy  Harlene Ramus 09/01/2013, 12:59 PM

## 2013-09-01 NOTE — Progress Notes (Signed)
Speech Language Pathology Daily Session Note  Patient Details  Name: [name] MRN: [id number] Date of Birth: 12-15-1950  Today's Date: 09/01/2013 Time: 0930-1000 Time Calculation (min): 30 min  Short Term Goals: Week 1: SLP Short Term Goal 1 (Week 1): Patient will utilize external aids for orientation with Mod assist  SLP Short Term Goal 2 (Week 1): Patient will express phrase lenght utterances with Mod sentence completion and question cues  Skilled Therapeutic Interventions: Skilled intervention provided with focus on speech/language goals. Pt seen in room with interpreter for ST tx session. 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 Pt disoriented to year, off by 1 year (2014) and could not recall date, despite max cues and use of external aides. Pt oriented to city, when given choice of 3. Pt ordered his lunch and dinner throughout interpreter with no difficulties, when given additional processing time and multiple repetitions. Pt named given objects and described their function with 90% accuracy, min assist.    FIM:  Comprehension Comprehension Mode: Auditory Comprehension: 3-Understands basic 50 - 74% of the time/requires cueing 25 - 50%  of the time Expression Expression: 3-Expresses basic 50 - 74% of the time/requires cueing 25 - 50% of the time. Needs to repeat parts of sentences. Social Interaction Social Interaction: 3-Interacts appropriately 50 - 74% of the time - May be physically or verbally inappropriate. Problem Solving Problem Solving: 2-Solves basic 25 - 49% of the time - needs direction more than half the time to initiate, plan or complete simple activities Memory Memory: 2-Recognizes or recalls 25 - 49% of the time/requires cueing 51 - 75% of the time  Pain Pain Assessment Pain Assessment: No/denies pain Faces Pain Scale: Hurts even more Pain Type: Chronic pain Pain Location: Ankle Pain Orientation: Left Pain Intervention(s): Repositioned  Therapy/Group:  Individual Therapy  [name] [name] 09/01/2013, 1:18 PM

## 2013-09-02 MED ORDER — AMLODIPINE BESYLATE 5 MG PO TABS
5.0000 mg | ORAL_TABLET | Freq: Every day | ORAL | Status: DC
  Administered 2013-09-02 – 2013-09-09 (×8): 5 mg via ORAL
  Filled 2013-09-02 (×11): qty 1

## 2013-09-02 NOTE — Progress Notes (Signed)
Subjective/Complaints: Patient has no concerns today. He speaks little [place] but states he is okay and without pain.  Objective: Vital Signs: Blood pressure 190/102, pulse 80, temperature 98.5 F (36.9 C), temperature source Oral, resp. rate 17, height 5' (1.524 m), weight 125 lb (56.7 kg), SpO2 98.00%.  Thin male in no acute distress. Chest clear to auscultation. Cardiac exam S1-S2 are regular. Abdominal exam active bowel sounds, soft nontender extremities no edema.  Medical Problem List and Plan:  1. Functional deficits secondary to [place], embolic infarcts in a patient with post-polio syndrome  2. DVT Prophylaxis/Anticoagulation: Pharmaceutical: Lovenox  3. Pain Management: He currently denies pain. 4. Mood: Provide ego support. LCSW to follow for evaluation and support.  5. Neuropsych: This patient is is not capable of making decisions on his own behalf. Defer financial decisions to POA 6. HTN: Blood pressure range is 150-190/95-102. Increase lisinopril yesterday. That will not be enough to manage his hypertension. Will add amlodipine. 8. 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 Routine pressure relief measures-- will order air mattress overlay.   LOS (Days) 5 A FACE TO FACE EVALUATION WAS PERFORMED  Bruce H Swords 09/02/2013, 8:25 AM

## 2013-09-03 ENCOUNTER — Encounter (HOSPITAL_COMMUNITY): Payer: Medicare Other

## 2013-09-03 ENCOUNTER — Inpatient Hospital Stay (HOSPITAL_COMMUNITY): Payer: Medicare Other

## 2013-09-03 ENCOUNTER — Encounter (HOSPITAL_COMMUNITY): Payer: Medicare Other | Admitting: Occupational Therapy

## 2013-09-03 ENCOUNTER — Inpatient Hospital Stay (HOSPITAL_COMMUNITY): Payer: Medicare Other | Admitting: Speech Pathology

## 2013-09-03 DIAGNOSIS — I6340 Cerebral infarction due to embolism of unspecified cerebral artery: Secondary | ICD-10-CM

## 2013-09-03 MED ORDER — DICLOFENAC SODIUM 1 % TD GEL
2.0000 g | Freq: Four times a day (QID) | TRANSDERMAL | Status: DC
  Administered 2013-09-03 – 2013-09-14 (×41): 2 g via TOPICAL
  Filled 2013-09-03: qty 100

## 2013-09-03 NOTE — Progress Notes (Signed)
Speech Language Pathology Daily Session Note  Patient Details  Name: [name] MRN: [id number] Date of Birth: 21-Jan-1951  Today's Date: 09/03/2013 Time: 1003-1045 Time Calculation (min): 42 min  Short Term Goals: Week 1: SLP Short Term Goal 1 (Week 1): Patient will utilize external aids for orientation with Mod assist  SLP Short Term Goal 2 (Week 1): Patient will express phrase lenght utterances with Mod sentence completion and question cues  Skilled Therapeutic Interventions: Skilled treatment session focused on addressing cognitive-linguistic goals.  Patient seen in room with interpreter present.  Patient able to verbalized orientation to place with choice of three.  SLP grouped objects (3 similar and 1 different) based on function and despite ability to label items and describe function with 90% accuracy he required Max question cues to identify odd object.  Patient demonstrated perseveration by seeking out object in same placement and required Max multi-modal cue to move on to accurately complete task.  Continue with current plan of care.    FIM:  Comprehension Comprehension Mode: Auditory Comprehension: 3-Understands basic 50 - 74% of the time/requires cueing 25 - 50%  of the time Expression Expression Mode: Verbal Expression: 3-Expresses basic 50 - 74% of the time/requires cueing 25 - 50% of the time. Needs to repeat parts of sentences. Social Interaction Social Interaction: 3-Interacts appropriately 50 - 74% of the time - May be physically or verbally inappropriate. Problem Solving Problem Solving: 3-Solves basic 50 - 74% of the time/requires cueing 25 - 49% of the time Memory Memory: 2-Recognizes or recalls 25 - 49% of the time/requires cueing 51 - 75% of the time FIM - Eating Eating Activity: 1: Helper feeds patient  Pain  yes, left ankle pain hurts worse, RN medicated pt and aware pain persists.    Therapy/Group: Individual Therapy  [name].,  CCC-SLP [place] 09/03/2013, 1:26 PM

## 2013-09-03 NOTE — Progress Notes (Signed)
Occupational Therapy Session Note  Patient Details  Name: [name] MRN: [id number] Date of Birth: 02/22/1951  Today's Date: 09/03/2013 Time: 2355-7322 Time Calculation (min): 41 min  Short Term Goals: Week 1:  OT Short Term Goal 1 (Week 1): Pt will maintain static sitting EOB with min assist for 3 mins in preparation for selfcare tasks. OT Short Term Goal 2 (Week 1): Pt will self feed using the LUE primarily with AE and supervision. OT Short Term Goal 3 (Week 1): Pt will perform 3 grooming tasks in supported sitting at the sink with supervision. OT Short Term Goal 4 (Week 1): Pt will perform UB bathing with min assist in supported sitting.  OT Short Term Goal 5 (Week 1): Pt will perform toilet transfers to drop arm commode vs commode bench with total assist using sliding board.   Skilled Therapeutic Interventions/Progress Updates:    Pt performed bathing and dressing supine to sit EOB during session.  Performed rolling in the bed for washing peri area and donning brief and pullover shorts.  Pt needing min assist for rolling to the right with use of the bed rail and mod assist to roll to the left.  Pt demonstrates increased functional use of the LUE to assist with bed mobility vs the right.  Pt transitioned to supine from left sidelying with total assist.  Initial posterior lean and LOB in sitting needing max assist to maintain balance with UB bathing and dressing.  Positioned washcloth and pan on bedside chair as pt demonstrates limited bilateral shoulder flexion.  Also positioning to encourage forward trunk flexion.  Pt unable to demonstrate forward trunk flexion secondary to increased fear.  Total assist for UB dressing and +2 total assist (pt 25%) for sliding board transfer to the wheelchair.   Therapy Documentation Precautions:  Precautions Precautions: Fall Precaution Comments: For change in neurologic condition, patient c/o severe headache, or nausea and vomiting develops.  SBP > 160  (not controlled by prn medications) or less than 100, DBP > 110 or less than 50, Temperature > 100.5 F, Heart rate > 120 or < 50, blood glucose > 180 or < 60 mg/dl; Air mattress--SR x 4, bilat PREVALON boots in bed Restrictions Weight Bearing Restrictions: No  Pain: Pain Assessment Pain Assessment: Faces Faces Pain Scale: Hurts little more Pain Type: Chronic pain Pain Location: Ankle Pain Orientation: Left Pain Intervention(s): Repositioned ADL: See FIM for current functional status  Therapy/Group: Individual Therapy  Cindra Presume OTR/L 09/03/2013, 12:27 PM

## 2013-09-03 NOTE — Plan of Care (Signed)
Problem: RH BLADDER ELIMINATION Goal: RH STG MANAGE BLADDER WITH EQUIPMENT WITH ASSISTANCE STG Manage Bladder With Equipment With mod Assistance  Outcome: Not Progressing Total assist   Problem: RH PAIN MANAGEMENT Goal: RH STG PAIN MANAGED AT OR BELOW PT'S PAIN GOAL Less than 3  Outcome: Not Progressing Ultram 50 mg po every 6 hours not decrasing pain in left foot

## 2013-09-03 NOTE — Progress Notes (Signed)
Physical Therapy Note  Patient Details  Name: [name] MRN: [id number] Date of Birth: 02/01/1951 Today's Date: 09/03/2013  Individual session I [phone]) Min/mod c/o pain right ankle with movement or pressure (RN applied medication)   Patient seen with daughter present to interpret.  Attempted patient propulsion in wheelchair and propelled x 10' very limited and non-functional due to right shoulder deformity with h/o polio.  Patient transferred to mat with slide board +2 total assist and max cues for anterior weight shift.  Seated activities to activate trunk musculature for anterior pelvic tilt and anterior translation of trunk over pelvis with assist.  Also for activation of lateral trunk flexors and stretch to left side to activate musculature and decrease activation due to fear of falling.  Reaching to place cones on b/s table to improve anterior weight shift and decrease fear of falling.  Sit<>supine total assist for orthotist to fit for PRAFO's.  Work on leaning to one elbow and mod assist to come upright to activate lateral trunk musculature.  Slide board transfer back to wheelchair with total assist.  Left in care of daughter at end of session.   Individual session II [phone])  Patient seen as previous with daughter present to interpret.  In therapy gym leaning forward with [name] on ball while in wheelchair for work on anterior weight shift and activating trunk extensors and core musculature for recovery from anterior loss of balance.  Transferred to mat with sliding board total assist with cues for trunk activation and UE assist to scoot.  Seated activation of lateral trunk flexors to assist with reciprocal scooting forward and back.  To supine for stretch to lateral trunk musculature and trunk extensors with trunk rotation and flexion.  Rolling in supine with max assist, cues and increased time.  Side to sit max assist.  Sitting on maxislide for improved movement of hips on mat.   Facilitation for trunk shortening and scooting one hip forward and back.  Static sitting balance with UE assist with supervision for 1-2 minutes at a time after trunk activities performed.  Sliding board back to wheelchair with max-total assist of 1 (after board set up).  Total assist in chair back to room with family present.   Max Sane 09/03/2013, 4:39 PM

## 2013-09-03 NOTE — Progress Notes (Signed)
Subjective/Complaints: 63 y.o. 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 CT head with acute Intraparenchymal hemorrhage right basal ganglia. He had increase in lethargy on 05/14 and follow up CCT with additional multifocal ischemic infarcts involving bilateral parietal and temporal lobes as well as right occipital lobe. 2D echo with EF 65-70% with no wall abnormality. Carotid dopplers without ICA stenosis. Patient with resultant paraparesis as well as LUE weakness, apraxia, decreased verbal output with naming errors as well as difficulty with multistep commands  Pt c/o left ankle pain Interpreter here today  Review of Systems - limited, has interpreter but cognitive issues limit accuracy Objective: Vital Signs: Blood pressure 180/88, pulse 69, temperature 98.3 F (36.8 C), temperature source Oral, resp. rate 16, height 5' (1.524 m), weight 56.7 kg (125 lb), SpO2 99.00%. No results found. No results found for this or any previous visit (from the past 72 hour(s)).    General: No acute distress Mood and affect are appropriate Heart: Regular rate and rhythm no rubs murmurs or extra sounds Lungs: Clear to auscultation, breathing unlabored, no rales or wheezes Abdomen: Positive bowel sounds, soft nontender to palpation, nondistended Extremities: no edema, atrophy in RUE with fasiculations in EDC, BLE atrophy in thigh , leg and feet Skin: No evidence of breakdown, no evidence of rash Neurologic: Cranial nerves II through XII intact, motor strength is 0/5 in RUE 4/5 in left deltoid, bicep, tricep, grip, 3-/5 bilateral hip flexor,0/5 in Bilateral  knee extensors, ankle dorsiflexor and plantar flexor, increased tone LUE,  decreased tone RUE and BLE Sensory exam limited by language  Musculoskeletal: Full passive range of motion in all 4 extremities.pain along Left Achilles, pain with passive ankle DF  Frog leg posture  No joint swelling   Assessment/Plan: 1. 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 Physiatrist is providing close team supervision and 24 hour management of active medical problems listed below. Physiatrist and rehab team continue to assess barriers to discharge/monitor patient progress toward functional and medical goals. FIM: FIM - Bathing Bathing Steps Patient Completed: Chest;Right Arm;Right upper leg;Left upper leg;Abdomen Bathing: 1: Two helpers  FIM - Upper Body Dressing/Undressing Upper body dressing/undressing steps patient completed: Thread/unthread right sleeve of pullover shirt/dresss Upper body dressing/undressing: 2: Max-Patient completed 25-49% of tasks FIM - Lower Body Dressing/Undressing Lower body dressing/undressing: 1: Total-Patient completed less than 25% of tasks  FIM - Toileting Toileting: 0: Activity did not occur  FIM - Radio producer Devices:  (commode bench) Toilet Transfers: 1-Two helpers  FIM - Financial planner Transfer: 1: Two helpers;1: Chair or W/C > Bed: Total A (helper does all/Pt. < 25%)  FIM - Locomotion: Wheelchair Locomotion: Wheelchair: 1: Travels less than 50 ft with minimal assistance (Pt.>75%) FIM - Locomotion: Ambulation Locomotion: Ambulation: 0: Activity did not occur  Comprehension Comprehension Mode: Auditory Comprehension: 3-Understands basic 50 - 74% of the time/requires cueing 25 - 50%  of the time  Expression Expression Mode: Verbal Expression:  3-Expresses basic 50 - 74% of the time/requires cueing 25 -  50% of the time. Needs to repeat parts of sentences.  Social Interaction Social Interaction: 3-Interacts appropriately 50 - 74% of the time - May be physically or verbally inappropriate.  Problem Solving Problem Solving: 2-Solves basic 25 - 49% of the time - needs direction more than half the time to initiate, plan or complete simple activities  Memory Memory: 2-Recognizes or recalls 25 - 49% of the time/requires cueing 51 - 75% of the time   Medical Problem List and Plan:  1. Functional deficits secondary to [place], embolic infarcts in a patient with post-polio syndrome  2. DVT Prophylaxis/Anticoagulation: Pharmaceutical: Lovenox  3. Pain Management: Used naprosyn bid at home. May need to schedule tylenol--monitor for now.  4. Mood: Provide ego support. LCSW to follow for evaluation and support.  5. Neuropsych: This patient is is not capable of making decisions on his own behalf. Defer financial decisions to POA 6. HTN: mainly systolic Will monitor BP every 8 hours and adjust medication as needed for tighter control. Continue lisinopril and HCTZ  7. Bilateral patchy infiltrates exam negative, xrays with poor inspiration, WBC normal. D/C levaquin and monitor 8. 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 Routine pressure relief measures-- will order air mattress overlay.  9.  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  Pt states he could move his R arm and his legs well at home, while daughter told OT that the main new weakness is LUE LOS (Days) 6 A FACE TO FACE EVALUATION WAS PERFORMED  Charlett Blake 09/03/2013, 9:14 AM

## 2013-09-04 ENCOUNTER — Inpatient Hospital Stay (HOSPITAL_COMMUNITY): Payer: Medicare Other | Admitting: Speech Pathology

## 2013-09-04 ENCOUNTER — Encounter (HOSPITAL_COMMUNITY): Payer: Medicare Other | Admitting: Occupational Therapy

## 2013-09-04 ENCOUNTER — Inpatient Hospital Stay (HOSPITAL_COMMUNITY): Payer: Medicare Other | Admitting: Rehabilitation

## 2013-09-04 DIAGNOSIS — I619 Nontraumatic intracerebral hemorrhage, unspecified: Secondary | ICD-10-CM

## 2013-09-04 DIAGNOSIS — B91 Sequelae of poliomyelitis: Secondary | ICD-10-CM

## 2013-09-04 DIAGNOSIS — I6340 Cerebral infarction due to embolism of unspecified cerebral artery: Secondary | ICD-10-CM

## 2013-09-04 LAB — BASIC METABOLIC PANEL
BUN: 41 mg/dL — ABNORMAL HIGH (ref 6–23)
CHLORIDE: 96 meq/L (ref 96–112)
CO2: 24 mEq/L (ref 19–32)
Calcium: 10 mg/dL (ref 8.4–10.5)
Creatinine, Ser: 0.98 mg/dL (ref 0.50–1.35)
GFR calc Af Amer: >90 mL/min (ref >90)
GFR calc non Af Amer: 86 mL/min — ABNORMAL LOW (ref >90)
Glucose, Bld: 128 mg/dL — ABNORMAL HIGH (ref 70–99)
POTASSIUM: 5 meq/L (ref 3.7–5.3)
Sodium: 135 mEq/L — ABNORMAL LOW (ref 137–147)

## 2013-09-04 LAB — URIC ACID: URIC ACID, SERUM: 8.2 mg/dL — AB (ref 4.0–7.8)

## 2013-09-04 NOTE — Progress Notes (Signed)
Occupational Therapy Session Note  Patient Details  Name: [name] MRN: [id number] Date of Birth: October 15, 1950  Today's Date: 09/04/2013 Time: 0903-1000 Time Calculation (min): 57 min  Short Term Goals: Week 1:  OT Short Term Goal 1 (Week 1): Pt will maintain static sitting EOB with min assist for 3 mins in preparation for selfcare tasks. OT Short Term Goal 2 (Week 1): Pt will self feed using the LUE primarily with AE and supervision. OT Short Term Goal 3 (Week 1): Pt will perform 3 grooming tasks in supported sitting at the sink with supervision. OT Short Term Goal 4 (Week 1): Pt will perform UB bathing with min assist in supported sitting.  OT Short Term Goal 5 (Week 1): Pt will perform toilet transfers to drop arm commode vs commode bench with total assist using sliding board.   Skilled Therapeutic Interventions/Progress Updates:    Pt performed bathing and dressing supine to sit EOB.  Max assist for rolling to the left and mod assist for rolling to the right for washing peri area and donning new brief.  Pt needs total assist to transition to sitting EOB.  Max assist for static sitting balance and total for dynamic during bathing.  Focus on pt performing functional reaching forward to grasp washcloth for bathing.  Max assist to reach forward with mod assist to support the left shoulder secondary to limited AROM.  Performed lateral leans side to side with total assist for pulling shorts over hips.  He also needed total assist for donning pullover as he could only assist slightly with pulling shirt up the right arm.  Pt with increased swelling in the left foot as well as pain throughout the foot as well.  Currently wearing bilateral PRAFOs for support.    Therapy Documentation Precautions:  Precautions Precautions: Fall Precaution Comments:   Restrictions Weight Bearing Restrictions: No  Pain: Pain Assessment Pain Assessment: Faces Faces Pain Scale: Hurts little more Pain Type:  Chronic pain Pain Location: Ankle Pain Orientation: Left Pain Descriptors / Indicators: Sore Pain Onset: Gradual Pain Intervention(s): Medication (See eMAR) (ultram 50 mgpo) Multiple Pain Sites: Yes PAINAD (Pain Assessment in Advanced Dementia) Breathing: normal Negative Vocalization: none Facial Expression: smiling or inexpressive Body Language: relaxed Consolability: no need to console PAINAD Score: 0 ADL: See FIM for current functional status  Therapy/Group: Individual Therapy  Cindra Presume OTR/L 09/04/2013, 12:30 PM

## 2013-09-04 NOTE — Progress Notes (Signed)
Speech Language Pathology Daily Session Note  Patient Details  Name: Lucious Zou MRN: [id number] Date of Birth: 02/24/1951  Today's Date: 09/04/2013 Time: 1005-1045 Time Calculation (min): 40 min  Short Term Goals: Week 1: SLP Short Term Goal 1 (Week 1): Patient will utilize external aids for orientation with Mod assist  SLP Short Term Goal 2 (Week 1): Patient will express phrase lenght utterances with Mod sentence completion and question cues  Skilled Therapeutic Interventions: Skilled treatment session focused on addressing cognitive-linguistic goals.  Patient seen in room with interpreter present.  Patient verbalized hospital and [facility] during orientation discussion, but was unable to state name of hospital or city despite choice of 3.  Patient labeled items in colored photo with Min verbal cues to id and correct semantic paraphasias.  SLP also facilitated session with Max question cues to identify similarities of objects.  Interpreter reported lots of um, ands, etc. during moments of anomia.  Continue with current plan of care.   FIM:  Comprehension Comprehension Mode: Auditory Comprehension: 3-Understands basic 50 - 74% of the time/requires cueing 25 - 50%  of the time Expression Expression Mode: Verbal Expression: 3-Expresses basic 50 - 74% of the time/requires cueing 25 - 50% of the time. Needs to repeat parts of sentences. Social Interaction Social Interaction: 3-Interacts appropriately 50 - 74% of the time - May be physically or verbally inappropriate. Problem Solving Problem Solving: 4-Solves basic 75 - 89% of the time/requires cueing 10 - 24% of the time Memory Memory: 2-Recognizes or recalls 25 - 49% of the time/requires cueing 51 - 75% of the time  Pain Pain Assessment Pain Assessment: Faces Faces Pain Scale: Hurts little more Pain Type: Chronic pain Pain Location: Ankle Pain Orientation: Left Pain Descriptors / Indicators: Sore Pain Onset: Gradual Pain  Intervention(s): Medication (See eMAR) (ultram 50 mgpo) PAINAD (Pain Assessment in Advanced Dementia) Breathing: normal Negative Vocalization: none Facial Expression: smiling or inexpressive Body Language: relaxed Consolability: no need to console PAINAD Score: 0  Therapy/Group: Individual Therapy  [name]., [place]  [name] 09/04/2013, 1:29 PM

## 2013-09-04 NOTE — Progress Notes (Signed)
Physical Therapy Session Note  Patient Details  Name: [name] MRN: [id number] Date of Birth: [birth year]  Today's Date: 09/04/2013 Time: 1130-1202 Time Calculation (min): 32 min  Short Term Goals: Week 1:  PT Short Term Goal 1 (Week 1): Pt will perform bed mobility with max A of one person PT Short Term Goal 2 (Week 1): Pt will perform bed <> w/c transfers with max A of one person PT Short Term Goal 3 (Week 1): Pt will perform dynamic sitting balance activities with mod A x 10 minutes PT Short Term Goal 4 (Week 1): Pt will tolerated supported standing (standing frame or tilt table) for LE and core strengthening x 5 minutes with total A  Skilled Therapeutic Interventions/Progress Updates:   Pt received sitting in w/c in room, agreeable to therapy with interpreter in room to translate during session.  Assisted pt down to therapy gym and performed slideboard transfer w/c <> mat table at +2 total assist (pt assist approx 10% when going back towards the L with LUE).  Continues to demonstrate increased fear with anterior lean and tends to push posteriorly during transfers and sitting balance.  Focus of session was sitting balance with back unsupported on mat with feet supported on stepping stool.  Requires intermittently min to max assist for sitting balance and then working towards forward weight shifts placing rings and also reaching to the R to work on trunk rotation.  Provided manual facilitation at ribs/trunk for upright posture and forward trunk lean at hips.  Note that pt unable to fully elevate LUE to place ring without facilitation and then would quickly latch onto tray table due to fear of falling.  Also requires tactile cues at shoulders for relaxed posture and at head for forward head lean with body.  Pt eventually able to perform a few times on his own to retrieve rings, however would quickly return LUE to leg and require facilitation to initiate task.  Pt tolerated sitting at EOM approx  15-17 mins total.  Pt assisted back to w/c as stated above and assisted back to room with half lap tray on and quick release belt in place with all needs in reach.  Pt able to recall how to notify RN with use of call bell.    Therapy Documentation Precautions:  Precautions Precautions: Fall Precaution Comments: For change in neurologic condition, patient c/o severe headache, or nausea and vomiting develops.  SBP > 160 (not controlled by prn medications) or less than 100, DBP > 110 or less than 50, Temperature > 100.5 F, Heart rate > 120 or < 50, blood glucose > 180 or < 60 mg/dl; Air mattress--SR x 4, bilat PREVALON boots in bed Restrictions Weight Bearing Restrictions: No   Vital Signs: Therapy Vitals BP: 140/78 mmHg Pain:  Pt with tenderness in L ankle/foot, better with foot in plantar flexed position.   See FIM for current functional status  Therapy/Group: Individual Therapy  Raquel Sarna A Mehul Rudin 09/04/2013, 12:08 PM

## 2013-09-04 NOTE — Progress Notes (Signed)
Physical Therapy Session Note  Patient Details  Name: [name] MRN: [id number] Date of Birth: [birth year]  Today's Date: 09/04/2013 Time: 8841-6606 Time Calculation (min): 54 min  Short Term Goals: Week 1:  PT Short Term Goal 1 (Week 1): Pt will perform bed mobility with max A of one person PT Short Term Goal 2 (Week 1): Pt will perform bed <> w/c transfers with max A of one person PT Short Term Goal 3 (Week 1): Pt will perform dynamic sitting balance activities with mod A x 10 minutes PT Short Term Goal 4 (Week 1): Pt will tolerated supported standing (standing frame or tilt table) for LE and core strengthening x 5 minutes with total A  Skilled Therapeutic Interventions/Progress Updates:   Pt received sitting in w/c, agreeable to therapy.  Daughter present during session and therefore PT discussed with daughter about team conference, goals of therapy, and if wife would be able to provide this type of physical assist.  Daughter states that she does not live with them and is not able to help 24/7.  Daughter verbalized understanding in that CSW would call wife tomorrow after conference and visit room if family there.  Assisted pt down to therapy gym in order to work on standing tolerance, trunk control and WB through LEs while in standing frame.  Attempted x 2 trials, however pt with increased pain in L ankle and then noted pt to be incontinent of urine, therefore assisted back to chair and back to room.  Performed slideboard transfer w/c>bed at +2 total assist (pt assist 10%) with continued verbal cues and manual facilitation for increased forward weight shift and trunk lean.  Provided assist at BLEs to prevent forward translation while on slideboard.  Provided +2 assist (pt assist 0%) for sit to supine.  Then performed several reps of rolling R and L (R min assist, L mod assist) in order to remove shorts, brief, RN in to change dressing and to don new brief and shorts.  Then performed SL>sit at  total assist.  Once at EOB, worked on static sitting balance with focus on increased anterior pelvic tilt, scapular retraction and head in neutral.  Then worked towards forward reaching for daughters hand and eventually pt able to reach down length of his L leg and return to sitting upright with mod assist. Continue to note pt tends to keep trunk rotated towards the L during forward reaching activity.  Pt assisted back to supine and left with all needs in reach and bed alarm set and 4 bedrails up due to air mattress.    Therapy Documentation Precautions:  Precautions Precautions: Fall Precaution Comments:   Restrictions Weight Bearing Restrictions: No   Pain: Pain Assessment Pain Assessment: Faces Faces Pain Scale: Hurts little more Pain Type: Chronic pain Pain Location: Ankle Pain Orientation: Left Pain Descriptors / Indicators: Sore Pain Onset: Gradual Pain Intervention(s): Medication (See eMAR) (ultram 50 mgpo) PAINAD (Pain Assessment in Advanced Dementia) Breathing: normal Negative Vocalization: none Facial Expression: smiling or inexpressive Body Language: relaxed Consolability: no need to console PAINAD Score: 0  See FIM for current functional status  Therapy/Group: Individual Therapy  Raquel Sarna A Abegail Kloeppel 09/04/2013, 2:57 PM

## 2013-09-04 NOTE — Progress Notes (Signed)
Social Work Patient ID: [name], male   DOB: 04-26-1950, 63 y.o.   MRN: [id number]  Darrell Mesi Zilda No, LCSW Social Worker Signed  Patient Care Conference Service date: 08/31/2013 7:26 PM  Inpatient RehabilitationTeam Conference and Plan of Care Update Date: 08/29/2013   Time: 11:30 AM     Patient Name: [name]       Medical Record Number: [id number]   Date of Birth: [birth year] Sex: Male         Room/Bed: 4W26C/4W26C-01 Payor Info: Payor: MEDICARE / Plan: MEDICARE PART A AND B / Product Type: *[name] Product type* /   Admitting Diagnosis: ICH, embolic infarcta and post polio syndrome   Admit Date/Time:  08/28/2013  6:05 PM Admission Comments: [name] comment available   Primary Diagnosis:  ICH (intracerebral hemorrhage) Principal Problem: ICH (intracerebral hemorrhage)    Patient Active Problem List     Diagnosis  Date Noted   .  HTN (hypertension)  08/29/2013   .  Post-poliomyelitis muscular atrophy  08/29/2013   .  Acute embolic stroke  09/73/5329   .  ICH (intracerebral hemorrhage)  08/22/2013   .  Stroke  08/22/2013     Expected Discharge Date: To be set at the next conference.  Pt was a new eval at time of conference.  Team Members Present: Physician leading conference: Dr. Alysia [name] Social Worker Present: Darrell Alpers, LCSW Nurse Present: Darrell Pattee, RN PT Present: [name], PT OT Present: [name], OT SLP Present: [name], SLP PPS Coordinator present : Darrell Nakayama, RN, CRRN        Current Status/Progress  Goal  Weekly Team Focus   Medical     paraparesis and RUE weakness, post polio with superimposed CVA  maximize functional independence  identify premorbid problems with mob   Bowel/Bladder     Incontinent of bowel and bladder; condom cath at night  Pt will be able to call for assistance when he needs to use the bathroom  Encourage pt to call for toileting needs and monitor and Q2hr monitoring    Swallow/Nutrition/ Hydration     eval  pending       ADL's     Mod assist for UB bathing and total assist for LB dressing.  Total assist for transfers via sliding board.  Decreased bilateral shoulder flexion with the right being slightly more impaired than the left.   mod to max assist overall  selfcare retraining, balance retraining, neuromuscular re-education, pt/family education   Mobility     total A +2   min-mod A transfers; max A to stand and ambulate with therapy only  Postural control/balance, tranfers, supported standing   Communication     eval pending       Safety/Cognition/ Behavioral Observations    eval pending       Pain     Pain in left foot; PRN tylenol 650mg  and tramadol 50mg  ordered   Pain level to be a 2 or less on the faces scale   Assess for pain Qshift and PRN   Skin     There are currently [name] skin issues  Pt is to remain free of skin breakdown and issues  Asses skin Q shift and PRN    Rehab Goals Patient on target to meet rehab goals: Yes Rehab Goals Revised: Pt's first conference.  This is eval day. *See Care Plan and progress notes for long and short-term goals.    Barriers to Discharge:  see above  Possible Resolutions to Barriers:    adaptive equipment      Discharge Planning/Teaching Needs:    Pt's d/c plan is unknown at this time. CSW to f/u with pt's wife to determine d/c plan for pt.   To be determined once d/c plan is determined.    Team Discussion:    Pt has a lot of difficulty with his right upper extremity post-polio and believes team his left upper extremity is affected by the strokes.  Many of pt's problems on his right side are chronic from pt having had polio.  Pt experienced high blood pressure just at the side of the bed.  Therapists feel pt will have moderate to maximum level goals and wonder who will be caring for pt at d/c.  CSW to meet with pt/family to discuss this.   Revisions to Treatment Plan:    None - pt just being evaluated    Continued Need for Acute  Rehabilitation Level of Care: The patient requires daily medical management by a physician with specialized training in physical medicine and rehabilitation for the following conditions: Daily direction of a multidisciplinary physical rehabilitation program to ensure safe treatment while eliciting the highest outcome that is of practical value to the patient.: Yes Daily medical management of patient stability for increased activity during participation in an intensive rehabilitation regime.: Yes Daily analysis of laboratory values and/or radiology reports with any subsequent need for medication adjustment of medical intervention for : Neurological problems;Other  [name] 09/04/2013, 9:08 AM

## 2013-09-04 NOTE — Consult Note (Signed)
NEUROBEHAVIORAL STATUS EXAM - CONFIDENTIAL [name]   MEDICAL NECESSITY:  [name] was seen on the [facility] for a neurobehavioral status exam owing to the patient's diagnosis of stroke, and to assist in treatment planning during admission.   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 He was reportedly admitted on 08/22/13 with complaints of malaise with generalized weakness x 10 days as well as inability to walk for a few days. Elevated BP in ED. Head CT scan reportedly revealed an acute intraparenchymal hemorrhage in the right basal ganglia. He had increased lethargy on 05/14 and follow up CCT showed additional multifocal ischemic infarcts involving bilateral parietal and temporal lobes as well as right occipital lobe. Patient with resultant paresis as well as LUE weakness, apraxia, decreased verbal output with naming errors as well as difficulty with multistep commands    During today's visit, [name] was accompanied by an interpreter because he knows no [place]. The patient reported suffering from memory loss, decreased attention, and expressive aphasia post-stroke. He also notices being clumsier with upper extremity weakness. No prior history of cognitive impairment reported.    [name] reported being in generally good spirits. He denied any history of mental health treatment. However, it was clear that he is experiencing a significant degree of depression post-stroke, as well as in reaction to certain social stressors (e.g., being in the process of getting divorced). In fact, the interpreter mentioned that his wife was here at an earlier time with social services and  they were trying to figure out [name] living situation once he discharges.    [name] feels that he is making strides in therapy and he has been very happy with the rehab staff. His adopted daughter purportedly is his only source of social support and she lives approximately 45 minutes from his residence.      PROCEDURES ADMINISTERED: [2 units [id number] on 09/03/13] Diagnostic clinical interview  Review of available records [facility] Cognitive Assessment  MENTAL STATUS: [name] mental status exam score of 4/30 is WELL BELOW the cutoff used to indicate dementia and severe cognitive impairment. He was somewhat oriented to time and place. He exhibited decreased naming, and impaired free recall but generally intact recognition memory. Simple and complex attention were impacted by both executive dysfunction, aphasia, and decreased comprehension. He was also quite stimulus bound.   Emotional & Behavioral Evaluation: [name] was appropriately dressed for season and situation. Normal posture was noted. He was quiet and did not spontaneously generate conversation. His speech was dysfluent with expressive aphasia observed. He had trouble understanding test direction and was stimulus bound. His affect was flat and he appeared depressed. He cried intermittently throughout the session when discussing certain topics. Attention and motivation were adequate. Optimal test taking conditions were maintained.  From an emotional standpoint, [name] denied suffering from blatant signs of depression or anxiety but it was clear that he is under a degree of duress. He seems to be adjusting fairly well to this admission. Suicidal/homicidal ideation, plan or intent was denied. No manic or hypomanic episodes were reported. The patient denied ever experiencing any auditory/visual hallucinations. No major behavioral or personality changes were endorsed.    Overall, [name] appears to  be suffering from a great deal of  cognitive impairment post-stroke; at the level of dementia. He is also experiencing a mild to moderate degree of depression and stress owing to his present medical situation and certain social stressors (e.g., divorce proceedings). Importantly, [name] does not seem to be fully aware of the nature and severity of his current deficits and does not seem to be fully competent to make the best decisions regarding his care. My hope would be that with time this could improve to a degree but prognosis seems poor.   In light of these findings, the following recommendations are provided.    RECOMMENDATIONS    Since emotional factors are adversely impacting the patient's daily life, consider implementing an antidepressant.    Follow-up as necessary to assess for changes in cognition and/or mood.     Rutha Bouchard, Psy.D.  Clinical Neuropsychologist

## 2013-09-04 NOTE — Progress Notes (Signed)
Subjective/Complaints: 63 y.o. 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 CT head with acute Intraparenchymal hemorrhage right basal ganglia. He had increase in lethargy on 05/14 and follow up CCT with additional multifocal ischemic infarcts involving bilateral parietal and temporal lobes as well as right occipital lobe. 2D echo with EF 65-70% with no wall abnormality. Carotid dopplers without ICA stenosis. Patient with resultant paraparesis as well as LUE weakness, apraxia, decreased verbal output with naming errors as well as difficulty with multistep commands  Now in [place] [facility]  Review of Systems - limited, has interpreter but cognitive issues limit accuracy Objective: Vital Signs: Blood pressure 125/74, pulse 72, temperature 97.9 F (36.6 C), temperature source Oral, resp. rate 18, height 5' (1.524 m), weight 56.7 kg (125 lb), SpO2 97.00%. No results found. No results found for this or any previous visit (from the past 72 hour(s)).    General: No acute distress Mood and affect are appropriate Heart: Regular rate and rhythm no rubs murmurs or extra sounds Lungs: Clear to auscultation, breathing unlabored, no rales or wheezes Abdomen: Positive bowel sounds, soft nontender to palpation, nondistended Extremities: no edema, atrophy in RUE with fasiculations in EDC, BLE atrophy in thigh , leg and feet Skin: No evidence of breakdown, no evidence of rash Neurologic: Cranial nerves II through XII intact, motor strength is 0/5 in RUE 4/5 in left deltoid, bicep, tricep, grip, 3-/5 bilateral hip flexor,0/5 in Bilateral  knee extensors, ankle dorsiflexor and plantar flexor, increased tone LUE, decreased tone RUE and  BLE Sensory exam limited by language  Musculoskeletal: Full passive range of motion in all 4 extremities.No pain to palp Left Achilles, pain with passive ankle DF  Frog leg posture  No joint swelling   Assessment/Plan: 1. 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 Physiatrist is providing close team supervision and 24 hour management of active medical problems listed below. Physiatrist and rehab team continue to assess barriers to discharge/monitor patient progress toward functional and medical goals. FIM: FIM - Bathing Bathing Steps Patient Completed: Chest;Right Arm;Abdomen;Front perineal area;Right upper leg;Left upper leg Bathing: 1: Two helpers  FIM - Upper Body Dressing/Undressing Upper body dressing/undressing steps patient completed: Thread/unthread right sleeve of pullover shirt/dresss Upper body dressing/undressing: 2: Max-Patient completed 25-49% of tasks FIM - Lower Body Dressing/Undressing Lower body dressing/undressing: 1: Total-Patient completed less than 25% of tasks  FIM - Toileting Toileting: 0: Activity did not occur  FIM - Radio producer Devices:  (commode bench) Toilet Transfers: 0-Activity did not occur  FIM - Control and instrumentation engineer Devices: Sliding board Bed/Chair Transfer: 1: Supine > Sit: Total A (helper does all/Pt. < 25%);1: Two helpers;1: Chair or W/C > Bed: Total A (helper does all/Pt. < 25%);1: Bed > Chair or W/C: Total A (helper does all/Pt. < 25%);1: Sit > Supine: Total A (helper does all/Pt. < 25%)  FIM - Locomotion: Wheelchair Locomotion: Wheelchair: 1: Total Assistance/staff pushes wheelchair (Pt<25%) FIM - Locomotion: Ambulation Locomotion: Ambulation: 0: Activity did not  occur  Comprehension Comprehension Mode: Auditory Comprehension: 3-Understands basic 50 -  74% of the time/requires cueing 25 - 50%  of the time  Expression Expression Mode: Verbal Expression: 3-Expresses basic 50 - 74% of the time/requires cueing 25 - 50% of the time. Needs to repeat parts of sentences.  Social Interaction Social Interaction: 3-Interacts appropriately 50 - 74% of the time - May be physically or verbally inappropriate.  Problem Solving Problem Solving: 3-Solves basic 50 - 74% of the time/requires cueing 25 - 49% of the time  Memory Memory: 2-Recognizes or recalls 25 - 49% of the time/requires cueing 51 - 75% of the time   Medical Problem List and Plan:  1. Functional deficits secondary to [place], embolic infarcts in a patient with post-polio syndrome  2. DVT Prophylaxis/Anticoagulation: Pharmaceutical: Lovenox  3. Pain Management: Used naprosyn bid at home. May need to schedule tylenol--monitor for now.  4. Mood: Provide ego support. LCSW to follow for evaluation and support.  5. Neuropsych: This patient is is not capable of making decisions on his own behalf. Defer financial decisions to POA 6. HTN: mainly systolic Will monitor BP every 8 hours and adjust medication as needed for tighter control. Continue lisinopril and HCTZ  7. Bilateral patchy infiltrates exam negative, xrays with poor inspiration, WBC normal. D/C levaquin and monitor 8. 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 Routine pressure relief measures-- will order air mattress overlay.  9.  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  Pt states he could move his R arm and his legs well at home, while daughter told OT that the main new weakness is LUE LOS (Days) 7 A FACE TO FACE EVALUATION WAS PERFORMED  Charlett Blake 09/04/2013, 7:40 AM

## 2013-09-05 ENCOUNTER — Inpatient Hospital Stay (HOSPITAL_COMMUNITY): Payer: Medicare Other

## 2013-09-05 ENCOUNTER — Inpatient Hospital Stay (HOSPITAL_COMMUNITY): Payer: Medicare Other | Admitting: Speech Pathology

## 2013-09-05 ENCOUNTER — Ambulatory Visit (HOSPITAL_COMMUNITY): Payer: Medicare Other | Admitting: Rehabilitation

## 2013-09-05 ENCOUNTER — Encounter (HOSPITAL_COMMUNITY): Payer: Medicare Other | Admitting: Occupational Therapy

## 2013-09-05 DIAGNOSIS — M79609 Pain in unspecified limb: Secondary | ICD-10-CM | POA: Diagnosis not present

## 2013-09-05 DIAGNOSIS — M7989 Other specified soft tissue disorders: Secondary | ICD-10-CM | POA: Diagnosis not present

## 2013-09-05 MED ORDER — PREDNISONE (PAK) 10 MG PO TABS: 10.0000 mg | ORAL_TABLET | Freq: Three times a day (TID) | ORAL | Status: DC

## 2013-09-05 MED ORDER — PREDNISONE 10 MG PO TABS
10.0000 mg | ORAL_TABLET | Freq: Every day | ORAL | Status: AC
  Administered 2013-09-05: 10 mg via ORAL
  Filled 2013-09-05: qty 1

## 2013-09-05 MED ORDER — PREDNISONE 20 MG PO TABS
20.0000 mg | ORAL_TABLET | Freq: Every day | ORAL | Status: AC
  Filled 2013-09-05: qty 1

## 2013-09-05 MED ORDER — PREDNISONE 10 MG PO TABS
10.0000 mg | ORAL_TABLET | Freq: Every day | ORAL | Status: AC
  Filled 2013-09-05: qty 1

## 2013-09-05 MED ORDER — PREDNISONE 10 MG PO TABS
10.0000 mg | ORAL_TABLET | Freq: Two times a day (BID) | ORAL | Status: AC
  Administered 2013-09-09 (×2): 10 mg via ORAL
  Filled 2013-09-05 (×2): qty 1

## 2013-09-05 MED ORDER — PREDNISONE 10 MG PO TABS
10.0000 mg | ORAL_TABLET | Freq: Three times a day (TID) | ORAL | Status: AC
  Administered 2013-09-06 (×3): 10 mg via ORAL
  Filled 2013-09-05 (×3): qty 1

## 2013-09-05 MED ORDER — PREDNISONE (PAK) 10 MG PO TABS: 20.0000 mg | ORAL_TABLET | Freq: Every evening | ORAL | Status: DC

## 2013-09-05 MED ORDER — PREDNISONE (PAK) 10 MG PO TABS
20.0000 mg | ORAL_TABLET | Freq: Every morning | ORAL | Status: DC
  Filled 2013-09-05: qty 21

## 2013-09-05 MED ORDER — PREDNISONE 20 MG PO TABS
20.0000 mg | ORAL_TABLET | Freq: Every day | ORAL | Status: AC
  Administered 2013-09-05: 20 mg via ORAL
  Filled 2013-09-05: qty 1

## 2013-09-05 MED ORDER — PREDNISONE (PAK) 10 MG PO TABS: 10.0000 mg | ORAL_TABLET | ORAL | Status: DC

## 2013-09-05 MED ORDER — PREDNISONE (PAK) 10 MG PO TABS: 10.0000 mg | ORAL_TABLET | Freq: Four times a day (QID) | ORAL | Status: DC

## 2013-09-05 MED ORDER — PREDNISONE 10 MG PO TABS
10.0000 mg | ORAL_TABLET | Freq: Three times a day (TID) | ORAL | Status: AC
  Administered 2013-09-08 (×3): 10 mg via ORAL
  Filled 2013-09-05 (×3): qty 1

## 2013-09-05 MED ORDER — PREDNISONE 10 MG PO TABS
10.0000 mg | ORAL_TABLET | Freq: Every day | ORAL | Status: AC
  Administered 2013-09-07: 10 mg via ORAL

## 2013-09-05 MED ORDER — PREDNISONE 20 MG PO TABS
20.0000 mg | ORAL_TABLET | Freq: Every day | ORAL | Status: AC
Start: 2013-09-06 — End: 2013-09-06
  Administered 2013-09-06: 20 mg via ORAL
  Filled 2013-09-05: qty 1

## 2013-09-05 MED ORDER — PREDNISONE 10 MG PO TABS
10.0000 mg | ORAL_TABLET | Freq: Three times a day (TID) | ORAL | Status: AC
  Administered 2013-09-07 (×4): 10 mg via ORAL
  Filled 2013-09-05 (×5): qty 1

## 2013-09-05 MED ORDER — MIRTAZAPINE 7.5 MG PO TABS
7.5000 mg | ORAL_TABLET | Freq: Every day | ORAL | Status: DC
  Administered 2013-09-06 – 2013-09-13 (×8): 7.5 mg via ORAL
  Filled 2013-09-05 (×11): qty 1

## 2013-09-05 NOTE — Progress Notes (Signed)
Physical Therapy Session Note  Patient Details  Name: [name] MRN: [id number] Date of Birth: 11/02/1950  Today's Date: 09/05/2013 Time: 1033-1120 Time Calculation (min): 47 min  Short Term Goals: Week 1:  PT Short Term Goal 1 (Week 1): Pt will perform bed mobility with max A of one person PT Short Term Goal 2 (Week 1): Pt will perform bed <> w/c transfers with max A of one person PT Short Term Goal 3 (Week 1): Pt will perform dynamic sitting balance activities with mod A x 10 minutes PT Short Term Goal 4 (Week 1): Pt will tolerated supported standing (standing frame or tilt table) for LE and core strengthening x 5 minutes with total A  Skilled Therapeutic Interventions/Progress Updates:   Pt received sitting in w/c in room, agreeable to therapy.  Assisted pt down to therapy gym and was joined by another PT for co-treatment (peer mentor) for working on functional transfers, sitting balance, postural control, and bed mobility.  Performed slideboard transfer w/c <> mat at +2 total assist (pt assist 40%) with decreased assist from therapist allowing pt to utilize active mm in order to initiate forward lean and transfer.  He was able to perform more of transfers than in previous sessions and during last transfer, noted to have marked improvement with forward trunk lean.  Once at [facility], worked on increasing pts comfort with forward trunk lean, providing manual facilitation at ribs/trunk for upright posture and elevated trunk/anterior pelvic tilt and shoulder retraction.  Pt able to perform very well, however would intermittently require verbal and tactile cues to maintain prior to every forward lean.  Worked on reaching, holding forward and then progressed to reaching across midline.  End of session focused on supine bed mobility with rolling to the L, rocking in that position for increased comfort and working on activation of trunk and head during transition.  Pt then able to perform reaching while in SL  position.  Assisted back into sitting with max assist with step by step instructional cues for task with assist for maintaining forward flexed/rotated position prior to sitting and also using LUE to assist with motion, providing tactile cues to prevent trunk extension.  Transferred back to w/c as stated above and assisted back to room.  Quick release belt donned and lap tray in place.  All needs in reach.  Interpretor present during session to assist with translating during session).    Therapy Documentation Precautions:  Precautions Precautions: Fall Precaution Comments:   Restrictions Weight Bearing Restrictions: No   Pain: Pain Assessment Pain Assessment: No/denies pain     See FIM for current functional status  Therapy/Group: Individual Therapy  Raquel Sarna A Maudry Zeidan 09/05/2013, 12:52 PM

## 2013-09-05 NOTE — Progress Notes (Addendum)
Physical Therapy Session Note  Patient Details  Name: [name] MRN: [id number] Date of Birth: [birth year]  Today's Date: 09/05/2013 Time: 1400-1430 Time Calculation (min): 30 min  Short Term Goals: Week 1:  PT Short Term Goal 1 (Week 1): Pt will perform bed mobility with max A of one person PT Short Term Goal 2 (Week 1): Pt will perform bed <> w/c transfers with max A of one person PT Short Term Goal 3 (Week 1): Pt will perform dynamic sitting balance activities with mod A x 10 minutes PT Short Term Goal 4 (Week 1): Pt will tolerated supported standing (standing frame or tilt table) for LE and core strengthening x 5 minutes with total A  Skilled Therapeutic Interventions/Progress Updates:   Skilled co-treatment during this session with OT.  Pt was taken down to the gym to work on sitting balance, functional transfers, and forward weightshifts. Pt was able to maintain static sitting EOM with close supervision. Worked on having pt flex trunk forward while supporting his UEs on therapists knees, sitting in front of him. He was able to eventually flex his trunk forward and return to midline but needs mod facilitation to maintain anterior pelvic tilt during transitions and mod to max assist to regain his balance. He relies heavily on his UEs to return to midline, as well as use of head. Back extensors can be initiated better when he performs cervical extension. Pt also worked on reciprical scooting with mod assist but demonstrates decreased lateral and forward weightshifts. Incorporated functional reaching as well with pt reaching for cones and pegs with the LUE to influence trunk flexion for LB selfcare tasks. Transitioned to scooting on mat with pt supporting UEs on therapist in front and mod assist. Performed sliding board transfer with total assist +2 (pt 40%) with increased time and max instructional cueing for sequencing. Pt's daughter present for session and very supportive.  Pt left in bed with  all needs in reach and bed alarm set.     Therapy Documentation Precautions:  Precautions Precautions: Fall Precaution Comments: Limited WB on LLE due to possible gout Restrictions Weight Bearing Restrictions: Yes LLE Weight Bearing: Partial weight bearing   Vital Signs: Therapy Vitals Temp: 98.1 F (36.7 C) Temp src: Oral Pulse Rate: 91 Resp: 18 BP: 126/64 mmHg Patient Position (if appropriate): Lying Oxygen Therapy SpO2: 97 % O2 Device: None (Room air) Pain: Pt continues to c/o pain in L ankle, 10/10, RN aware.     See FIM for current functional status  Therapy/Group: Co-Treatment with OT  Keshonda Monsour A Lopez Dentinger 09/05/2013, 6:53 PM

## 2013-09-05 NOTE — Progress Notes (Signed)
Subjective/Complaints: 63 y.o. 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 CT head with acute Intraparenchymal hemorrhage right basal ganglia. He had increase in lethargy on 05/14 and follow up CCT with additional multifocal ischemic infarcts involving bilateral parietal and temporal lobes as well as right occipital lobe. 2D echo with EF 65-70% with no wall abnormality. Carotid dopplers without ICA stenosis. Patient with resultant paraparesis as well as LUE weakness, apraxia, decreased verbal output with naming errors as well as difficulty with multistep commands  Now in [place] PRAFO  Review of Systems - limited, has interpreter but cognitive issues limit accuracy Objective: Vital Signs: Blood pressure 147/83, pulse 71, temperature 98.3 F (36.8 C), temperature source Oral, resp. rate 18, height 5' (1.524 m), weight 57.153 kg (126 lb), SpO2 98.00%. No results found. Results for orders placed during the hospital encounter of 08/28/13 (from the past 72 hour(s))  URIC ACID     Status: Abnormal   Collection Time    09/04/13 12:10 PM      Result Value Ref Range   Uric Acid, Serum 8.2 (*) 4.0 - 7.8 mg/dL  BASIC METABOLIC PANEL     Status: Abnormal   Collection Time    09/04/13 12:10 PM      Result Value Ref Range   Sodium 135 (*) 137 - 147 mEq/L   Potassium 5.0  3.7 - 5.3 mEq/L   Chloride 96  96 - 112 mEq/L   CO2 24  19 - 32 mEq/L   Glucose, Bld 128 (*) 70 - 99 mg/dL   BUN 41 (*) 6 - 23 mg/dL   Creatinine, Ser 0.98  0.50 - 1.35 mg/dL   Calcium 10.0  8.4 - 10.5 mg/dL   GFR calc non Af Amer 86 (*) >90 mL/min   GFR calc Af Amer >90  >90 mL/min   Comment: (NOTE)     The eGFR has been calculated using the CKD EPI  equation.     This calculation has not been validated in all clinical situations.     eGFR's persistently <90 mL/min signify possible Chronic Kidney     Disease.      General: No acute distress Mood and affect are appropriate Heart: Regular rate and rhythm no rubs murmurs or extra sounds Lungs: Clear to auscultation, breathing unlabored, no rales or wheezes Abdomen: Positive bowel sounds, soft nontender to palpation, nondistended Extremities: no edema, atrophy in RUE with fasiculations in EDC, BLE atrophy in thigh , leg and feet Skin: No evidence of breakdown, no evidence of rash Neurologic: Cranial nerves II through XII intact, motor strength is 0/5 in RUE 4/5 in left deltoid, bicep, tricep, grip, 3-/5 bilateral hip flexor,0/5 in Bilateral  knee extensors, ankle dorsiflexor and plantar flexor, increased tone LUE, decreased tone RUE and BLE Sensory exam limited by language  Musculoskeletal: Full passive range of motion in all 4 extremities.min pain to palp Left Achilles, pain with passive ankle DF  Frog leg posture  No joint swelling   Assessment/Plan: 1. 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 Physiatrist is providing  close team supervision and 24 hour management of active medical problems listed below. Physiatrist and rehab team continue to assess barriers to discharge/monitor patient progress toward functional and medical goals. FIM: FIM - Bathing Bathing Steps Patient Completed: Chest;Right Arm;Abdomen;Front perineal area;Right upper leg;Left upper leg Bathing: 1: Two helpers  FIM - Upper Body Dressing/Undressing Upper body dressing/undressing steps patient completed: Thread/unthread right sleeve of pullover shirt/dresss Upper body dressing/undressing: 1: Total-Patient completed less than  25% of tasks FIM - Lower Body Dressing/Undressing Lower body dressing/undressing: 1: Two helpers  FIM - Toileting Toileting: 0: Activity did not occur  FIM - Radio producer Devices:  (commode bench) Toilet Transfers: 0-Activity did not occur  FIM - Control and instrumentation engineer Devices: Sliding board;Arm rests Bed/Chair Transfer: 1: Two helpers;1: Supine > Sit: Total A (helper does all/Pt. < 25%)  FIM - Locomotion: Wheelchair Locomotion: Wheelchair: 0: Activity did not occur FIM - Locomotion: Ambulation Locomotion: Ambulation: 0: Activity did not occur  Comprehension Comprehension Mode: Auditory Comprehension: 3-Understands basic 50 - 74% of the time/requires cueing 25 - 50%  of the time  Expression Expression Mode: Verbal Expression: 3-Expresses basic 50 - 74% of the time/requires cueing 25 - 50% of the time. Needs to repeat parts of sentences.  Social Interaction Social Interaction: 3-Interacts appropriately 50 - 74% of the time - May be physically or verbally inappropriate.  Problem Solving Problem Solving: 3-Solves basic 50 - 74% of the time/requires cueing 25 - 49% of the time  Memory Memory: 2-Recognizes or recalls 25 - 49% of the time/requires cueing 51 - 75% of the time   Medical Problem List and Plan:  1. Functional deficits secondary to [place], embolic infarcts in a patient with post-polio syndrome  2. DVT Prophylaxis/Anticoagulation: Pharmaceutical: Lovenox  3. Pain Management: Used naprosyn bid at home. May need to schedule tylenol--monitor for now.  4. Mood: Provide ego support. LCSW to follow for evaluation and support.  5. Neuropsych: This patient is is not capable of making decisions on his own behalf. Defer financial decisions to POA 6. HTN: mainly systolic Will monitor BP every 8 hours and adjust medication as needed for tighter control. Continue lisinopril and HCTZ  7. Bilateral patchy infiltrates exam  negative, xrays with poor inspiration, WBC normal. D/C levaquin and monitor 8. 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 Routine pressure relief measures-- will order air mattress overlay.  9.  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  Pt states he could move his R arm and his legs well at home, while daughter told OT that the main new weakness is LUE 10.  Left achilles tendinitis tx with PRAFO and voltaren gel LOS (Days) 8 A FACE TO FACE EVALUATION WAS PERFORMED  Charlett Blake 09/05/2013, 9:05 AM

## 2013-09-05 NOTE — Progress Notes (Signed)
Acute renal insufficiency noted likely due to poor intake. Push po fluids. Will d/c HCTZ. Recheck labs in am.

## 2013-09-05 NOTE — Progress Notes (Signed)
Occupational Therapy Weekly Progress Note  Patient Details  Name: [name] MRN: [id number] Date of Birth: 29-Apr-1950  Beginning of progress report period: Aug 30, 2013 End of progress report period: Sep 05, 2013  Today's Date: 09/05/2013 Time: 3976-7341 Time Calculation (min): 43 min  Patient has met 1 of 5 short term goals.  [name] continues to need max to total assist for most selfcare tasks at this time.  Total assist for transitions supine to sit EOB with overall max facilitation needed for pt to maintain sitting balance while engaged in bathing tasks.  Increased LOB posteriorly with decreased ability to flex his trunk forward to maintain his balance or when attempting to reach items for bathing that are placed in front of him.  Pt with minimal use of the RUE which was premorbid but he is able to use the LUE as an active assist for bathing and slight dressing tasks.  Decreased shoulder strength noted however at a 2-/5 requiring mod assist to help with reaching forward.  Pt currently needs total assist + 2 for sliding board transfers to wheelchair or commode bench.  Pt unable to effectively assist with transfer secondary to increased fear of forward trunk flexion and pt attempting to hold onto therapist with the LUE as opposed to helping push his way across the board.  In addition, he is unable to stand at this time and is having severe pain in his left ankle which MD is aware of.  Bilateral PRAFOs have been provided.  Have discussed pt's current level with his daughter [name] and that he will need extensive 24 hour assist at discharge.  Unsure at this time if family can provide.  May likely need SNF as progress anticipated to be slow based on pre existing history of Polio and inaccurate information regarding his prior functional level before admission.   Patient continues to demonstrate the following deficits: decreased balance, decreased RUE functional use, decreased bilateral LE strength,  decreased cognition, and therefore will continue to benefit from skilled OT intervention to enhance overall performance with BADL.  Patient progressing toward long term goals..  Continue plan of care.  OT Short Term Goals Week 2:  OT Short Term Goal 1 (Week 2): Pt will maintain static sitting EOB with min assist for 3 mins in preparation for selfcare tasks. OT Short Term Goal 2 (Week 2): Pt will self feed using the LUE primarily with AE and supervision. OT Short Term Goal 3 (Week 2): Pt will perform 3 grooming tasks in supported sitting at the sink with supervision. OT Short Term Goal 4 (Week 2): Pt will perform toilet transfers to drop arm commode vs commode bench with total assist using sliding board.  OT Short Term Goal 5 (Week 2): Pt will roll side to side in bed to assist with peri hygiene with min assist to both the left and right.   Skilled Therapeutic Interventions/Progress Updates:    Pt performed bathing and dressing supine to sit on the EOB.  Min assist to roll to the right for peri washing prior to sitting but need max assist for rolling to the left.  Total assist for left sidelying to sitting.  Pt worked on UB bathing in sitting with max assist needed secondary to increased posterior lean with frequent LOB.  He continues to demonstrate decreased trunk control and needs max assist to help facilitate forward trunk flexion for reaching his wash basin and washcloth.  Min assist for UB bathing and max assist for  LB bathing.  He needed total assist for all dressing including donning shirt and shorts, all aspects.  Total assist +2 ( pt less than 10%) for sliding board transfer to the wheelchair. Pt with decreased forward flexion and decreased attempted use of the LUE to assist with transfer.    Therapy Documentation Precautions:  Precautions Precautions: Fall Precaution Comments:   Restrictions Weight Bearing Restrictions: No  Pain: Pain Assessment Pain Assessment: No/denies  pain ADL: See FIM for current functional status  Therapy/Group: Individual Therapy  Cindra Presume OTR/L 09/05/2013, 10:50 AM

## 2013-09-05 NOTE — Progress Notes (Signed)
Speech Language Pathology Weekly Progress and Session Note  Patient Details  Name: Eastin Swing MRN: [id number] Date of Birth: November 15, 1950  Beginning of progress report period: Aug 29, 2013 End of progress report period: Sep 05, 2013  Today's Date: 09/05/2013 Time: 1005-1030 Time Calculation (min): 25 min  Short Term Goals: Week 1: SLP Short Term Goal 1 (Week 1): Patient will utilize external aids for orientation with Mod assist  SLP Short Term Goal 1 - Progress (Week 1): Progressing toward goal SLP Short Term Goal 2 (Week 1): Patient will express phrase lenght utterances with Mod sentence completion and question cues SLP Short Term Goal 2 - Progress (Week 1): Met    New Short Term Goals: Week 2: SLP Short Term Goal 1 (Week 2): Patient will utilize external aids for orientation with Mod assist  SLP Short Term Goal 2 (Week 2): Patient will express phrase lenght utterances with Min sentence completion and question cues  Weekly Progress Updates: Patient has made functional gains and has met 1 of 2 short term goals this reporting period due to improved ability to name items, and express short phrase expressions.  Patient's ability to verbally express spontaneous needs and wants has fewer instances of anomia; however, during moments of demands when expression break downs occur patient is unable to utilize strategies to assist himself.  Currently, patient continues to require Mod assist for orientation and structured naming tasks.  Patient and family education ongoing. Patient would benefit from continued skilled SLP intervention to maximize orientation, recall of new information and ability to express self consistently in order to maximize his functional independence prior to discharge with 24/7 assist.    Intensity: Minumum of 1-2 x/day, 30 to 90 minutes Frequency: 5 out of 7 days Duration/Length of Stay: 18-21 days Treatment/Interventions: Cueing hierarchy;Cognitive  remediation/compensation;Environmental controls;Functional tasks;Internal/external aids;Patient/family education;Speech/Language facilitation;Therapeutic Activities   Daily Session  Skilled Therapeutic Interventions: Skilled treatment session focused on addressing cognitive-linguistic goals.  Interpreter late to session and as a result, patient missed 20 minutes of therapy.  Interpreter present for duration of session today.  Patient required Mod assist choice of three to order lunch and dinner.  Patient required Max assist to verbally sequence a basic self-care task.  Patient exhibited intermittent perseverative errors today typically during moments of anomia, SLP questions patient utilizing this as a working memory strategy versus attempt to self-cue.  Continue with current plan of care.    FIM:  Comprehension Comprehension Mode: Auditory Comprehension: 3-Understands basic 50 - 74% of the time/requires cueing 25 - 50%  of the time Expression Expression Mode: Verbal Expression: 3-Expresses basic 50 - 74% of the time/requires cueing 25 - 50% of the time. Needs to repeat parts of sentences. Social Interaction Social Interaction: 3-Interacts appropriately 50 - 74% of the time - May be physically or verbally inappropriate. Problem Solving Problem Solving: 4-Solves basic 75 - 89% of the time/requires cueing 10 - 24% of the time Memory Memory: 2-Recognizes or recalls 25 - 49% of the time/requires cueing 51 - 75% of the time General  Amount of Missed SLP Time (min): 20 Minutes Missed Time Reason: Other (Comment) (no intrepreter) Pain Pain Assessment Pain Assessment: Faces Faces Pain Scale: Hurts little more Pain Type: Chronic pain Pain Location: Ankle Pain Orientation: Left Pain Intervention(s): Repositioned  Therapy/Group: Individual Therapy  [name]., CCC-SLP [place] 09/05/2013, 4:33 PM

## 2013-09-05 NOTE — Progress Notes (Signed)
Occupational Therapy Session Note  Patient Details  Name: [name] MRN: [id number] Date of Birth: 08-12-1950  Today's Date: 09/05/2013 Time: 1330-1400 Time Calculation (min): 30 min  Skilled Therapeutic Interventions/Progress Updates:    Pt was taken down to the gym to work on sitting balance, functional transfers, and forward weightshifts.  Pt was able to maintain static sitting EOM with close supervision.  Worked on having pt flex trunk forward while supporting his UEs on therapists knees, sitting in front of him.  He was able to eventually flex his trunk forward and return to midline but needs mod facilitation to maintain anterior pelvic tilt during transitions and mod to max assist to regain his balance.  He relies heavily on his UEs to return to midline.  Back extensors can be initiated better when he performs cervical extension. Pt also worked on reciprical scooting with mod assist but demonstrates decreased lateral and forward weightshifts.   Incorporated functional reaching as well with pt reaching for cones and pegs with the LUE to influence trunk flexion for LB selfcare tasks.  Transitioned to scooting on mat with pt supporting UEs on therapist in front and mod assist.  Performed sliding board transfer with total assist +2 (pt 40%) with increased time and max instructional cueing for sequencing.  Pt's daughter present for session and very supportive.   Therapy Documentation Precautions:  Precautions Precautions: Fall Precaution Comments: Limited WB on LLE due to possible gout Restrictions Weight Bearing Restrictions: Yes LLE Weight Bearing: Partial weight bearing  Pain: Pain Assessment Pain Assessment: Faces Faces Pain Scale: Hurts little more Pain Type: Chronic pain Pain Location: Ankle Pain Orientation: Left Pain Intervention(s): Repositioned ADL: See FIM for current functional status  Therapy/Group: Co-Treatment  Cindra Presume OTR/L 09/05/2013, 4:03 PM

## 2013-09-06 ENCOUNTER — Encounter (HOSPITAL_COMMUNITY): Payer: Medicare Other | Admitting: Occupational Therapy

## 2013-09-06 ENCOUNTER — Inpatient Hospital Stay (HOSPITAL_COMMUNITY): Payer: Medicare Other | Admitting: Occupational Therapy

## 2013-09-06 ENCOUNTER — Inpatient Hospital Stay (HOSPITAL_COMMUNITY): Payer: Medicare Other | Admitting: Speech Pathology

## 2013-09-06 ENCOUNTER — Inpatient Hospital Stay (HOSPITAL_COMMUNITY): Payer: Medicare Other | Admitting: Rehabilitation

## 2013-09-06 DIAGNOSIS — I6340 Cerebral infarction due to embolism of unspecified cerebral artery: Secondary | ICD-10-CM

## 2013-09-06 DIAGNOSIS — I619 Nontraumatic intracerebral hemorrhage, unspecified: Secondary | ICD-10-CM

## 2013-09-06 DIAGNOSIS — B91 Sequelae of poliomyelitis: Secondary | ICD-10-CM

## 2013-09-06 LAB — BASIC METABOLIC PANEL
BUN: 41 mg/dL — AB (ref 6–23)
CHLORIDE: 99 meq/L (ref 96–112)
CO2: 21 mEq/L (ref 19–32)
Calcium: 10.3 mg/dL (ref 8.4–10.5)
Creatinine, Ser: 0.74 mg/dL (ref 0.50–1.35)
GFR calc Af Amer: >90 mL/min (ref >90)
GFR calc non Af Amer: >90 mL/min (ref >90)
Glucose, Bld: 139 mg/dL — ABNORMAL HIGH (ref 70–99)
POTASSIUM: 5.5 meq/L — AB (ref 3.7–5.3)
Sodium: 137 mEq/L (ref 137–147)

## 2013-09-06 NOTE — Progress Notes (Signed)
Occupational Therapy Session Note  Patient Details  Name: [name] MRN: [id number] Date of Birth: 1950-12-08  Today's Date: 09/06/2013 Time: 1761-6073 Time Calculation (min): 43 min  Skilled Therapeutic Interventions/Progress Updates:    Pt worked on static and dynamic sitting balance EOM during session.  Initially performed sliding board transfer to the mat with total assist +2 (pt 30%).  Allowed pt to stabilize his bilateral UEs on therapists forearm and work on pushing the therapist away, while he was sitting in front of the pt.  He was able to return back to upright sitting with only min assist and slight use of UEs for support.  Progressed to having pt bring his head forward to touch therapists head while keeping his UEs on the mat.  He was able to perform small forward weightshifts forward and return to sitting with min assist in preparation for sliding board transfers.  Performed sliding board transfer back to the wheelchair with total assist +2 (pt 30%).  Assisted pt with use of the LUE to help propel wheelchair.  Pt unable to perform adequate shoulder extension for greater wheel movement so needed max assist to reposition but then he was able to push it forward with only min assist.  Pt's daughter present for session and very thankful for therapy assisting her father.  Therapist discussed pt making some progress but still needs extensive assist for selfcare tasks.   Therapy Documentation Precautions:  Precautions Precautions: Fall Precaution Comments: Limited WB on LLE due to possible gout Restrictions Weight Bearing Restrictions: Yes LLE Weight Bearing: Partial weight bearing  Pain: Pain Assessment Pain Assessment: No/denies pain Faces Pain Scale: Hurts a little bit Pain Type: Acute pain Pain Location: Ankle Pain Orientation: Left Pain Intervention(s): Repositioned;Emotional support ADL: See FIM for current functional status  Therapy/Group: Individual Therapy  Cindra Presume OTR/L 09/06/2013, 4:05 PM

## 2013-09-06 NOTE — Progress Notes (Signed)
Subjective/Complaints: 63 y.o. 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 CT head with acute Intraparenchymal hemorrhage right basal ganglia. He had increase in lethargy on 05/14 and follow up CCT with additional multifocal ischemic infarcts involving bilateral parietal and temporal lobes as well as right occipital lobe. 2D echo with EF 65-70% with no wall abnormality. Carotid dopplers without ICA stenosis. Patient with resultant paraparesis as well as LUE weakness, apraxia, decreased verbal output with naming errors as well as difficulty with multistep commands  Now in [place] PRAFO  Review of Systems - limited, poor awareness of def Objective: Vital Signs: Blood pressure 128/83, pulse 65, temperature 98.4 F (36.9 C), temperature source Oral, resp. rate 17, height 5' (1.524 m), weight 58.6 kg (129 lb 3 oz), SpO2 98.00%. Dg Foot 2 Views Left  09/05/2013   CLINICAL DATA:  Left foot pain and swelling.  Polio.  EXAM: LEFT FOOT - 2 VIEW  COMPARISON:  Left ankle radiographs 08/26/2013  FINDINGS: The bones are osteopenic. Deformity of the talus of flattening of the talar down is again seen as described on recent ankle radiographs and may reflect prior trauma. No acute fracture or dislocation is identified. Joint spaces in the foot are preserved. Soft tissue swelling is present about the midfoot and forefoot. Pes planus is noted. Vascular calcifications are noted.  IMPRESSION: No acute osseous abnormality identified. Mid and forefoot soft tissue swelling.   Electronically Signed   By: Logan Bores   On: 09/05/2013 16:22   Results for orders placed during the hospital encounter of 08/28/13 (from the past 72 hour(s))  URIC ACID      Status: Abnormal   Collection Time    09/04/13 12:10 PM      Result Value Ref Range   Uric Acid, Serum 8.2 (*) 4.0 - 7.8 mg/dL  BASIC METABOLIC PANEL     Status: Abnormal   Collection Time    09/04/13 12:10 PM      Result Value Ref Range   Sodium 135 (*) 137 - 147 mEq/L   Potassium 5.0  3.7 - 5.3 mEq/L   Chloride 96  96 - 112 mEq/L   CO2 24  19 - 32 mEq/L   Glucose, Bld 128 (*) 70 - 99 mg/dL   BUN 41 (*) 6 - 23 mg/dL   Creatinine, Ser 0.98  0.50 - 1.35 mg/dL   Calcium 10.0  8.4 - 10.5 mg/dL   GFR calc non Af Amer 86 (*) >90 mL/min   GFR calc Af Amer >90  >90 mL/min   Comment: (NOTE)     The eGFR has been calculated using the CKD EPI equation.     This calculation has not been validated in all clinical situations.     eGFR's persistently <90 mL/min signify possible Chronic Kidney     Disease.  BASIC METABOLIC PANEL     Status: Abnormal   Collection Time    09/06/13  5:12 AM      Result Value Ref Range   Sodium 137  137 - 147 mEq/L   Potassium 5.5 (*) 3.7 - 5.3 mEq/L   Chloride 99  96 - 112 mEq/L   CO2 21  19 - 32 mEq/L  Glucose, Bld 139 (*) 70 - 99 mg/dL   BUN 41 (*) 6 - 23 mg/dL   Creatinine, Ser 0.74  0.50 - 1.35 mg/dL   Calcium 10.3  8.4 - 10.5 mg/dL   GFR calc non Af Amer >90  >90 mL/min   GFR calc Af Amer >90  >90 mL/min   Comment: (NOTE)     The eGFR has been calculated using the CKD EPI equation.     This calculation has not been validated in all clinical situations.     eGFR's persistently <90 mL/min signify possible Chronic Kidney     Disease.      General: No acute distress Mood and affect are appropriate Heart: Regular rate and rhythm no rubs murmurs or extra sounds Lungs: Clear to auscultation, breathing unlabored, no rales or wheezes Abdomen: Positive bowel sounds, soft nontender to palpation, nondistended Extremities: no edema, atrophy in RUE with fasiculations in EDC, BLE atrophy in thigh , leg and feet Skin: No evidence of breakdown, no evidence of  rash Neurologic: Cranial nerves II through XII intact, motor strength is 0/5 in RUE 4/5 in left deltoid, bicep, tricep, grip, 3-/5 bilateral hip flexor,0/5 in Bilateral  knee extensors, ankle dorsiflexor and plantar flexor, increased tone LUE, decreased tone RUE and BLE Sensory exam limited by language  Musculoskeletal: Full passive range of motion in all 4 extremities.nopain to palp Left Achilles or left foot, no pain with passive ankle DF  Frog leg posture  No joint swelling, has dependant edema distal to [facility]   Assessment/Plan: 1. 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 Physiatrist is providing close team supervision and 24 hour management of active medical problems listed below. Physiatrist and rehab team continue to assess barriers to discharge/monitor patient progress toward functional and medical goals. FIM: FIM - Bathing Bathing Steps Patient Completed: Chest;Right Arm;Abdomen;Front perineal area;Right upper leg;Left upper leg Bathing: 1: Two helpers  FIM - Upper Body Dressing/Undressing Upper body dressing/undressing steps patient completed: Thread/unthread right sleeve of pullover shirt/dresss Upper body dressing/undressing: 1: Total-Patient completed less than 25% of tasks FIM - Lower Body Dressing/Undressing Lower body dressing/undressing: 1: Two helpers  FIM - Toileting Toileting: 0: Activity did not occur  FIM - Radio producer Devices:  (commode bench) Toilet Transfers: 0-Activity did not occur  FIM - Control and instrumentation engineer Devices: Sliding board;Arm rests Bed/Chair Transfer: 1: Two helpers;1: Supine > Sit: Total A (helper does all/Pt. < 25%)  FIM - Locomotion: Wheelchair Locomotion: Wheelchair: 0: Activity did not  occur FIM - Locomotion: Ambulation Locomotion: Ambulation: 0: Activity did not occur  Comprehension Comprehension Mode: Auditory Comprehension: 3-Understands basic 50 - 74% of the time/requires cueing 25 - 50%  of the time  Expression Expression Mode: Verbal Expression: 3-Expresses basic 50 - 74% of the time/requires cueing 25 - 50% of the time. Needs to repeat parts of sentences.  Social Interaction Social Interaction: 3-Interacts appropriately 50 - 74% of the time - May be physically or verbally inappropriate.  Problem Solving Problem Solving: 4-Solves basic 75 - 89% of the time/requires cueing 10 - 24% of the time  Memory Memory: 2-Recognizes or recalls 25 - 49% of the time/requires cueing 51 - 75% of the time   Medical Problem List and Plan:  1. Functional deficits secondary to [place], embolic infarcts in a  patient with post-polio syndrome  2. DVT Prophylaxis/Anticoagulation: Pharmaceutical: Lovenox  3. Pain Management: Used naprosyn bid at home. May need to schedule tylenol--monitor for now.  4. Mood: Provide ego support. LCSW to follow for evaluation and support.  5. Neuropsych: This patient is is not capable of making decisions on his own behalf. Defer financial decisions to POA 6. HTN: mainly systolic Will monitor BP every 8 hours and adjust medication as needed for tighter control. Continue lisinopril and HCTZ   8. 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 Routine pressure relief measures-- will order air mattress overlay.  9.  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  Left achilles tendinitis tx with PRAFO and voltaren gel, on medrol for suspected gout flare, urate minimally elevated , no gouty arthropathy noted,  ankle film shows OA, foot xray unremarkable No pain on exam left  foot and ankle LOS (Days) 9 A FACE TO FACE EVALUATION WAS PERFORMED  Charlett Blake 09/06/2013, 7:53 AM

## 2013-09-06 NOTE — Progress Notes (Signed)
Occupational Therapy Session Note  Patient Details  Name: [name] MRN: [id number] Date of Birth: 25-May-1950  Today's Date: 09/06/2013 Time: 0900-1000 Time Calculation (min): 60 min  Short Term Goals: Week 2:  OT Short Term Goal 1 (Week 2): Pt will maintain static sitting EOB with min assist for 3 mins in preparation for selfcare tasks. OT Short Term Goal 2 (Week 2): Pt will self feed using the LUE primarily with AE and supervision. OT Short Term Goal 3 (Week 2): Pt will perform 3 grooming tasks in supported sitting at the sink with supervision. OT Short Term Goal 4 (Week 2): Pt will perform toilet transfers to drop arm commode vs commode bench with total assist using sliding board.  OT Short Term Goal 5 (Week 2): Pt will roll side to side in bed to assist with peri hygiene with min assist to both the left and right.   Skilled Therapeutic Interventions/Progress Updates:    Pt worked on bathing and dressing supine to sit EOB.  Pt needed mod assist for bed mobility rolling side to side for donning brief.  He transitioned to sitting EOB with total assist.  Pt able to sit EOB with min assist statically and max assist dynamically when performing UB and LB bathing.  Pt needed max hand over hand assistance with the RUE to wash the left arm.  Frequent LOB backwards as well.  Total assist for all dressing with lateral leans side to side.  Pt transferred with total assist +2 (pt 25%) for sliding board transfer to the wheelchair.  Pt reporting slight decreased pain in the left foot.    Therapy Documentation Precautions:  Precautions Precautions: Fall Precaution Comments: Limited WB on LLE due to possible gout Restrictions Weight Bearing Restrictions: Yes LLE Weight Bearing: Partial weight bearing  Pain: Pain Assessment Pain Assessment: Faces Faces Pain Scale: Hurts little more Pain Type: Chronic pain Pain Location: Ankle Pain Orientation: Left Pain Intervention(s): Repositioned;Emotional  support ADL: See FIM for current functional status  Therapy/Group: Individual Therapy  Cindra Presume OTR/L 09/06/2013, 10:48 AM

## 2013-09-06 NOTE — Progress Notes (Signed)
Physical Therapy Session Note  Patient Details  Name: [name] MRN: [id number] Date of Birth: 13-Jun-1950  Today's Date: 09/06/2013 Time: 1116-1202 Time Calculation (min): 46 min  Short Term Goals: Week 1:  PT Short Term Goal 1 (Week 1): Pt will perform bed mobility with max A of one person PT Short Term Goal 2 (Week 1): Pt will perform bed <> w/c transfers with max A of one person PT Short Term Goal 3 (Week 1): Pt will perform dynamic sitting balance activities with mod A x 10 minutes PT Short Term Goal 4 (Week 1): Pt will tolerated supported standing (standing frame or tilt table) for LE and core strengthening x 5 minutes with total A  Skilled Therapeutic Interventions/Progress Updates:   Pt received sitting in w/c in room, agreeable to therapy.  Interpretor present during session to assist with translation.  Assisted pt down to therapy gym in order to work on bed mobility, trunk flex/rotation, sitting balance, and functional transfers.  Performed slideboard transfer w/c <> mat at +2 total assist (pt assist 20% going to mat, 40% going to w/c) with continued verbal cues and manual facilitation for increased forward weight shift.  Again, continue to decrease amount of physical assist to allow pt to problem solve how to perform transfer.  Also assisted with hand placement during transfer for better lift off and at BLEs to prevent forward translation on board.  Once on mat, assisted into supine with manual facilitation for continued forward flex and rotation, as pt continues to use extensor patterns during all mobility.  Once in supine, performed several reps of rolling to the L with visual target for hand with facilitation for forward movement of hips.  Also worked on reaching forward and downward with LUE (pt compensating with using RUE to assist LUE).  Ended session with seated balance activity to continue to address forward leaning, upright posture, and anterior pelvic tilt.  Transferred back to  w/c as stated above.  Assisted pt back to room and left in w/c with quick release belt donned, lap tray on w/c and all needs in reach.   Therapy Documentation Precautions:  Precautions Precautions: Fall Precaution Comments: Limited WB on LLE due to possible gout Restrictions Weight Bearing Restrictions: Yes LLE Weight Bearing: Partial weight bearing   Pain: Pain Assessment Pain Assessment: Faces Faces Pain Scale: Hurts little more Pain Type: Chronic pain Pain Location: Ankle Pain Orientation: Left Pain Intervention(s): Repositioned;Emotional support  See FIM for current functional status  Therapy/Group: Individual Therapy  Raquel Sarna A Brookley Spitler 09/06/2013, 1:23 PM

## 2013-09-06 NOTE — Progress Notes (Signed)
Recreational Therapy Session Note  Patient Details  Name: [name] MRN: [id number] Date of Birth: [birth year] Today's Date: 09/06/2013  Pain: no c/o Skilled Therapeutic Interventions/Progress Updates: Session focused on activity tolerance, slide board transfers, dynamic sitting balance & UE strength/exercise during TR/OT co-treat. Pt performed slide board transfer with Total assist +2, pt =~25%.  Pt sat EOM for simple activities emphasizing forward leans to build confidence in trunk control & decrease fear of falling during transfers.  Pt's daughter present & interpreting information as needed throughout session.  Therapy/Group:cotreatment  Waldon Reining 09/06/2013, 3:34 PM

## 2013-09-06 NOTE — Progress Notes (Signed)
Speech Language Pathology Daily Session Note  Patient Details  Name: [name] MRN: [id number] Date of Birth: June 29, 1950  Today's Date: 09/06/2013 Time: 1015-1105 Time Calculation (min): 50 min  Short Term Goals: Week 2: SLP Short Term Goal 1 (Week 2): Patient will utilize external aids for orientation with Mod assist  SLP Short Term Goal 2 (Week 2): Patient will express phrase lenght utterances with Min sentence completion and question cues  Skilled Therapeutic Interventions: Skilled treatment session focused on addressing cognitive-linguistic goals.  Patient seen in room during breakfast with interpreter present.  Patient labeled items on food tray with Mod sematic and choice of three cues.  SLP requested patient direct SLP what to load on fork from a choice of two.  Patient required Max choice of two cues to initiate requests/direct care giver.  Patient verbalized [place] and [place] during orientation discussion, but was unable to state hospital, name of hospital or situation/stroke. Continue with current plan of care.     FIM:  Comprehension Comprehension Mode: Auditory Comprehension: 3-Understands basic 50 - 74% of the time/requires cueing 25 - 50%  of the time Expression Expression Mode: Verbal Expression: 3-Expresses basic 50 - 74% of the time/requires cueing 25 - 50% of the time. Needs to repeat parts of sentences. Social Interaction Social Interaction: 3-Interacts appropriately 50 - 74% of the time - May be physically or verbally inappropriate. Problem Solving Problem Solving: 4-Solves basic 75 - 89% of the time/requires cueing 10 - 24% of the time Memory Memory: 2-Recognizes or recalls 25 - 49% of the time/requires cueing 51 - 75% of the time  Pain Pain Assessment Pain Assessment: No/denies pain  Therapy/Group: Individual Therapy  [name]., [place]  [name] 09/06/2013, 3:56 PM

## 2013-09-06 NOTE — Patient Care Conference (Signed)
Inpatient RehabilitationTeam Conference and Plan of Care Update Date: 09/05/2013   Time: 11:30 AM    Patient Name: [name]      Medical Record Number: [id number]  Date of Birth: [birth year] Sex: Male         Room/Bed: 4W26C/4W26C-01 Payor Info: Payor: MEDICARE / Plan: MEDICARE PART A AND B / Product Type: *No Product type* /    Admitting Diagnosis: ICH, embolic infarcta and post polio syndrome  Admit Date/Time:  08/28/2013  6:05 PM Admission Comments: No comment available   Primary Diagnosis:  ICH (intracerebral hemorrhage) Principal Problem: ICH (intracerebral hemorrhage)  Patient Active Problem List   Diagnosis Date Noted  . HTN (hypertension) 08/29/2013  . 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 Acute embolic stroke 87/56/4332  . ICH (intracerebral hemorrhage) 08/22/2013  . Stroke 08/22/2013    Expected Discharge Date: Expected Discharge Date: 09/13/13  Team Members Present: Physician leading conference: Dr. Alysia Penna Nurse Present: Elliot Cousin, RN PT Present: Cameron Sprang, Cecille Rubin, PT OT Present: Willeen Cass, Maryella Shivers, OT SLP Present: Gunnar Fusi, SLP;Other (comment) Elmyra Ricks Page, SLP) PPS Coordinator present : Ileana Ladd, Lelan Pons, RN, CRRN     Current Status/Progress Goal Weekly Team Focus  Medical   Left achilles pain, poss gout, chronic triplegia post polio  control pain during rehab stay  med management of LE pain   Bowel/Bladder   Incontinent of bowel and bladder; condom cath at night  Pt will be able to call for assistance when he needs to use the bathroom  Encourage pt to call for toileting needs and monitor and Q2hr monitoring    Swallow/Nutrition/ Hydration   regular and thin with full assist due to physical limitiations         ADL's   Currently total assist for LB selfcare supine to sit as well as mod assist for UB bathing and max assist for UB dressing.  Decreased bilateral shoulder AROM and functional  use with right being more impaired than the left.    mod assist level transfers, bathing max assist for toileting and max assist for LB dressing. supervision for self feeding and for grooming tasks  selfcare re-training, balance re-training, neuromuscular re-education, pt/family education   Mobility   Currently requires total assist for bed mobility, total to +2 assist for slideboard transfers for safety.  Continues to demonstrate decreased ability to lean forward during transfers and sitting balance and tends to push posteriorly.    min-mod A transfers; max A to stand and ambulate with therapy only  postural control/balance, transfers, bed mobility, supported standing.    Communication   Mod assist   Min assist   increase use of word finding strategies during moments of anomia   Safety/Cognition/ Behavioral Observations  Mod assist   Min assist   increase use of external aids for orientation   Pain   Pain in left foot; PRN tylenol 650mg  and tramadol 50mg  ordered   Pain level to be a 2 or less on the faces scale   Assess for pain Qshift and PRN   Skin   There are currently no skin issues  Pt is to remain free of skin breakdown and issues  Asses skin Q shift and PRN    Rehab Goals Patient on target to meet rehab goals: Yes Rehab Goals Revised: None *See Care Plan and progress notes for long and short-term goals.  Barriers to Discharge: acute on chronic disability    Possible Resolutions to Barriers:  cont  rehab, adaptive equip    Discharge Planning/Teaching Needs:  Pt's d/c plan remains unknown at this time. CSW has spoken to pt's wife once to discuss d/c disposition for pt and she has not informed CSW of plan yet.  CSW has called and text messaged wife.  Dtr is aware that d/c will be next week and that it needs to be determined if pt is going home with family or to SNF.  Dtr expressed understanding.  Pt stated wife is taking him home "when I am better."  CSW will continue to call wife.   To be determined once d/c plan is determined.   Team Discussion:  Pt has pain in his foot and MD is assessing for gout.  Pt started on steroids to help with inflammation.  Pt did well with and initiated a slide board transfer with PT and OT co-treating.  Pt is fearful to do many tasks.  Pt will be a lot of care at home.  Pt has poor recall and aphasia, but can make a choice from 3 options.  Family will need to come for family education if the plan is for pt to go home.  Pt is incontinent of bowel and bladder.  Revisions to Treatment Plan:  None   Continued Need for Acute Rehabilitation Level of Care: The patient requires daily medical management by a physician with specialized training in physical medicine and rehabilitation for the following conditions: Daily direction of a multidisciplinary physical rehabilitation program to ensure safe treatment while eliciting the highest outcome that is of practical value to the patient.: Yes Daily medical management of patient stability for increased activity during participation in an intensive rehabilitation regime.: Yes Daily analysis of laboratory values and/or radiology reports with any subsequent need for medication adjustment of medical intervention for : Neurological problems;Other  Silvestre Mesi Yvett Rossel 09/06/2013, 2:14 PM

## 2013-09-06 NOTE — Progress Notes (Signed)
Social Work Patient ID: [name], male   DOB: 25-Jul-1950, 63 y.o.   MRN: [id number]  CSW met with pt with the interpreter to talk to him about team conference and to find if he knew what wife was planning to do for pt in re: to d/c.  Pt stated that she was going to take him home when he is better.  CSW has not heard from her.  Interpreter tried to help CSW call wife without success.  CSW received text message from wife, so CSW texted her back asking for her to call when she can discuss pt's discharge.  CSW has not heard back from her at this time.  Pt was tearful when talking about d/c plan.  Pt's dtr was made aware of d/c next week.  She is deferring to pt's wife for decisions.  Her sister offered to care of pt before he even came to the hospital and wife declined this offer.  Thus, dtrs are deferring to wife for decision making on pt's care.  CSW will continue to reach out to wife to make a d/c plan for pt.  Pt may need SNF care.

## 2013-09-06 NOTE — Progress Notes (Signed)
Social Work Patient ID: [name], male   DOB: February 02, 1951, 63 y.o.   MRN: [id number]  Darrell Mesi Loyce Flaming, LCSW Social Worker Signed  Patient Care Conference Service date: 09/06/2013 2:13 PM  Inpatient RehabilitationTeam Conference and Plan of Care Update Date: 09/05/2013   Time: 11:30 AM     Patient Name: [name]       Medical Record Number: [id number]   Date of Birth: 01-Jun-1950 Sex: Male         Room/Bed: 4W26C/4W26C-01 Payor Info: Payor: MEDICARE / Plan: MEDICARE PART A AND B / Product Type: *No Product type* /   Admitting Diagnosis: ICH, embolic infarcta and post polio syndrome   Admit Date/Time:  08/28/2013  6:05 PM Admission Comments: No comment available   Primary Diagnosis:  ICH (intracerebral hemorrhage) Principal Problem: ICH (intracerebral hemorrhage)    Patient Active Problem List     Diagnosis  Date Noted   .  HTN (hypertension)  08/29/2013   .  Post-poliomyelitis muscular atrophy  08/29/2013   .  Acute embolic stroke  66/09/3014   .  ICH (intracerebral hemorrhage)  08/22/2013   .  Stroke  08/22/2013     Expected Discharge Date: Expected Discharge Date: 09/13/13  Team Members Present: Physician leading conference: Dr. Alysia Penna Nurse Present: Elliot Cousin, RN PT Present: Cameron Sprang, Cecille Rubin, PT OT Present: Willeen Cass, Maryella Shivers, OT SLP Present: Gunnar Fusi, SLP;Other (comment) Elmyra Ricks Page, SLP) PPS Coordinator present : Ileana Ladd, Lelan Pons, RN, CRRN        Current Status/Progress  Goal  Weekly Team Focus   Medical     Left achilles pain, poss gout, chronic triplegia post polio  control pain during rehab stay  med management of LE pain   Bowel/Bladder     Incontinent of bowel and bladder; condom cath at night  Pt will be able to call for assistance when he needs to use the bathroom  Encourage pt to call for toileting needs and monitor and Q2hr monitoring    Swallow/Nutrition/ Hydration     regular and thin  with full assist due to physical limitiations       ADL's     Currently total assist for LB selfcare supine to sit as well as mod assist for UB bathing and max assist for UB dressing.  Decreased bilateral shoulder AROM and functional use with right being more impaired than the left.    mod assist level transfers, bathing max assist for toileting and max assist for LB dressing. supervision for self feeding and for grooming tasks  selfcare re-training, balance re-training, neuromuscular re-education, pt/family education   Mobility     Currently requires total assist for bed mobility, total to +2 assist for slideboard transfers for safety.  Continues to demonstrate decreased ability to lean forward during transfers and sitting balance and tends to push posteriorly.    min-mod A transfers; max A to stand and ambulate with therapy only  postural control/balance, transfers, bed mobility, supported standing.    Communication     Mod assist   Min assist   increase use of word finding strategies during moments of anomia   Safety/Cognition/ Behavioral Observations    Mod assist   Min assist   increase use of external aids for orientation   Pain     Pain in left foot; PRN tylenol 650mg  and tramadol 50mg  ordered   Pain level to be a 2 or less on the faces scale   Assess  for pain Qshift and PRN   Skin     There are currently no skin issues  Pt is to remain free of skin breakdown and issues  Asses skin Q shift and PRN    Rehab Goals Patient on target to meet rehab goals: Yes Rehab Goals Revised: None *See Care Plan and progress notes for long and short-term goals.    Barriers to Discharge:  acute on chronic disability      Possible Resolutions to Barriers:    cont rehab, adaptive equip      Discharge Planning/Teaching Needs:    Pt's d/c plan remains unknown at this time. CSW has spoken to pt's wife once to discuss d/c disposition for pt and she has not informed CSW of plan yet.  CSW has called and  text messaged wife.  Dtr is aware that d/c will be next week and that it needs to be determined if pt is going home with family or to SNF.  Dtr expressed understanding.  Pt stated wife is taking him home "when I am better."  CSW will continue to call wife.   To be determined once d/c plan is determined.    Team Discussion:    Pt has pain in his foot and MD is assessing for gout.  Pt started on steroids to help with inflammation.  Pt did well with and initiated a slide board transfer with PT and OT co-treating.  Pt is fearful to do many tasks.  Pt will be a lot of care at home.  Pt has poor recall and aphasia, but can make a choice from 3 options.  Family will need to come for family education if the plan is for pt to go home.  Pt is incontinent of bowel and bladder.   Revisions to Treatment Plan:    None    Continued Need for Acute Rehabilitation Level of Care: The patient requires daily medical management by a physician with specialized training in physical medicine and rehabilitation for the following conditions: Daily direction of a multidisciplinary physical rehabilitation program to ensure safe treatment while eliciting the highest outcome that is of practical value to the patient.: Yes Daily medical management of patient stability for increased activity during participation in an intensive rehabilitation regime.: Yes Daily analysis of laboratory values and/or radiology reports with any subsequent need for medication adjustment of medical intervention for : Neurological problems;Other  Darrell Mesi Huie Ghuman 09/06/2013, 2:14 PM

## 2013-09-07 ENCOUNTER — Encounter (HOSPITAL_COMMUNITY): Payer: Medicare Other | Admitting: Occupational Therapy

## 2013-09-07 ENCOUNTER — Ambulatory Visit (HOSPITAL_COMMUNITY): Payer: Medicare Other | Admitting: Rehabilitation

## 2013-09-07 ENCOUNTER — Inpatient Hospital Stay (HOSPITAL_COMMUNITY): Payer: Medicare Other | Admitting: Speech Pathology

## 2013-09-07 ENCOUNTER — Inpatient Hospital Stay (HOSPITAL_COMMUNITY): Payer: Medicare Other | Admitting: Rehabilitation

## 2013-09-07 DIAGNOSIS — I6340 Cerebral infarction due to embolism of unspecified cerebral artery: Secondary | ICD-10-CM

## 2013-09-07 DIAGNOSIS — I619 Nontraumatic intracerebral hemorrhage, unspecified: Secondary | ICD-10-CM

## 2013-09-07 DIAGNOSIS — B91 Sequelae of poliomyelitis: Secondary | ICD-10-CM

## 2013-09-07 MED ORDER — SODIUM CHLORIDE 0.45 % IV SOLN
INTRAVENOUS | Status: DC
  Administered 2013-09-07 – 2013-09-10 (×4): via INTRAVENOUS

## 2013-09-07 NOTE — Progress Notes (Signed)
Physical Therapy Weekly Progress Note  Patient Details  Name: [name] MRN: [id number] Date of Birth: 06-23-1950  Beginning of progress report period: Aug 29, 2013 End of progress report period: Sep 07, 2013  Today's Date: 09/07/2013    Patient has met 1 of 4 short term goals.  Pt making very slow progress with therapy due to increased fear with any transitional movements and with forward movements, limiting ability to transfer easier with slideboard.  Pt has made mild gains with sitting balance and is able to sit statically for approx 15 mins at a time with supervision, however with dynamic sitting balance, can require up to max assist to prevent LOB.  Sessions have been focusing on transitional movements, gaining trust and rapport with pt, and sitting balance with forward reaching to increase independence with transfers.  Feel he will continue to need increased amount of care at D/C and continue to recommend SNF for follow up.   Patient continues to demonstrate the following deficits: decreased balance, decreased functional use of all four extremities, decreased attention, decreased awareness, decreased safety, fear of movement and therefore will continue to benefit from skilled PT intervention to enhance overall performance with activity tolerance, balance, postural control, ability to compensate for deficits, functional use of  right upper extremity, right lower extremity, left upper extremity and left lower extremity, attention, awareness, coordination and knowledge of precautions.  Patient progressing toward long term goals..  Continue plan of care.  PT Short Term Goals Week 1:  PT Short Term Goal 1 (Week 1): Pt will perform bed mobility with max A of one person PT Short Term Goal 1 - Progress (Week 1): Met PT Short Term Goal 2 (Week 1): Pt will perform bed <> w/c transfers with max A of one person PT Short Term Goal 2 - Progress (Week 1): Progressing toward goal PT Short Term Goal 3  (Week 1): Pt will perform dynamic sitting balance activities with mod A x 10 minutes PT Short Term Goal 3 - Progress (Week 1): Progressing toward goal (can be max assist at times) PT Short Term Goal 4 (Week 1): Pt will tolerated supported standing (standing frame or tilt table) for LE and core strengthening x 5 minutes with total A PT Short Term Goal 4 - Progress (Week 1): Progressing toward goal Week 2:  PT Short Term Goal 1 (Week 2): Pt will perform bed mobility with mod A of one person PT Short Term Goal 2 (Week 2): Pt will perform bed <> w/c transfers with max A of one person PT Short Term Goal 3 (Week 2): Pt will perform dynamic sitting balance activities with mod A x 10 minutes PT Short Term Goal 4 (Week 2): Pt will tolerated supported standing (standing frame or tilt table) for LE and core strengthening x 5 minutes with total A  Skilled Therapeutic Interventions/Progress Updates:   See previous notes for interventions.   Therapy Documentation Precautions:  Precautions Precautions: Fall Precaution Comments: Limited WB on LLE due to possible gout Restrictions Weight Bearing Restrictions: Yes LLE Weight Bearing: Partial weight bearing   Vital Signs: Therapy Vitals Temp: 97.7 F (36.5 C) Temp src: Oral Pulse Rate: 63 Resp: 19 BP: 142/70 mmHg (RN notified) Patient Position (if appropriate): Lying Oxygen Therapy SpO2: 98 % O2 Device: None (Room air)  See FIM for current functional status  Therapy/Group: Individual Therapy  Raquel Sarna A Oluwanifemi Petitti 09/07/2013, 8:12 AM

## 2013-09-07 NOTE — Progress Notes (Signed)
Speech Language Pathology Daily Session Note  Patient Details  Name: [name] MRN: [id number] Date of Birth: November 28, 1950  Today's Date: 09/07/2013 Time: 1015-1100 Time Calculation (min): 45 min  Short Term Goals: Week 2: SLP Short Term Goal 1 (Week 2): Patient will utilize external aids for orientation with Mod assist  SLP Short Term Goal 2 (Week 2): Patient will express phrase lenght utterances with Min sentence completion and question cues  Skilled Therapeutic Interventions: Skilled treatment session focused on addressing cognitive-linguistic goals.  Patient seen in room with interpreter present.  Patent verbalized orientation x4 with increased wait time! Patient ordered lunch and dinner with choice of three; he required increased wait time and initiated a repeat x1! Patient labeled 3-4 items he grew in his garden PTA and required Max cues to utilize description as a word finding strategy to describe 2 more objects.  Continue with current plan of care.     FIM:  Comprehension Comprehension Mode: Auditory Comprehension: 4-Understands basic 75 - 89% of the time/requires cueing 10 - 24% of the time Expression Expression Mode: Verbal Expression: 3-Expresses basic 50 - 74% of the time/requires cueing 25 - 50% of the time. Needs to repeat parts of sentences. Social Interaction Social Interaction: 4-Interacts appropriately 75 - 89% of the time - Needs redirection for appropriate language or to initiate interaction. Problem Solving Problem Solving: 4-Solves basic 75 - 89% of the time/requires cueing 10 - 24% of the time Memory Memory: 3-Recognizes or recalls 50 - 74% of the time/requires cueing 25 - 49% of the time  Pain Pain Assessment Pain Assessment: No/denies pain  Therapy/Group: Individual Therapy  [name]., [place]  [name] 09/07/2013, 12:23 PM

## 2013-09-07 NOTE — Plan of Care (Signed)
Problem: RH PAIN MANAGEMENT Goal: RH STG PAIN MANAGED AT OR BELOW PT'S PAIN GOAL Less than 3 Outcome: Progressing No c/o pain     

## 2013-09-07 NOTE — Progress Notes (Signed)
Subjective/Complaints: 63 y.o. 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 CT head with acute Intraparenchymal hemorrhage right basal ganglia. He had increase in lethargy on 05/14 and follow up CCT with additional multifocal ischemic infarcts involving bilateral parietal and temporal lobes as well as right occipital lobe. 2D echo with EF 65-70% with no wall abnormality. Carotid dopplers without ICA stenosis. Patient with resultant paraparesis as well as LUE weakness, apraxia, decreased verbal output with naming errors as well as difficulty with multistep commands  Now in [place] PRAFO  Review of Systems - limited, poor awareness of def Objective: Vital Signs: Blood pressure 142/70, pulse 63, temperature 97.7 F (36.5 C), temperature source Oral, resp. rate 19, height 5' (1.524 m), weight 58.6 kg (129 lb 3 oz), SpO2 98.00%. Dg Foot 2 Views Left  09/05/2013   CLINICAL DATA:  Left foot pain and swelling.  Polio.  EXAM: LEFT FOOT - 2 VIEW  COMPARISON:  Left ankle radiographs 08/26/2013  FINDINGS: The bones are osteopenic. Deformity of the talus of flattening of the talar down is again seen as described on recent ankle radiographs and may reflect prior trauma. No acute fracture or dislocation is identified. Joint spaces in the foot are preserved. Soft tissue swelling is present about the midfoot and forefoot. Pes planus is noted. Vascular calcifications are noted.  IMPRESSION: No acute osseous abnormality identified. Mid and forefoot soft tissue swelling.   Electronically Signed   By: Logan Bores   On: 09/05/2013 16:22   Results for orders placed during the hospital encounter of 08/28/13 (from the past 72 hour(s))  URIC ACID      Status: Abnormal   Collection Time    09/04/13 12:10 PM      Result Value Ref Range   Uric Acid, Serum 8.2 (*) 4.0 - 7.8 mg/dL  BASIC METABOLIC PANEL     Status: Abnormal   Collection Time    09/04/13 12:10 PM      Result Value Ref Range   Sodium 135 (*) 137 - 147 mEq/L   Potassium 5.0  3.7 - 5.3 mEq/L   Chloride 96  96 - 112 mEq/L   CO2 24  19 - 32 mEq/L   Glucose, Bld 128 (*) 70 - 99 mg/dL   BUN 41 (*) 6 - 23 mg/dL   Creatinine, Ser 0.98  0.50 - 1.35 mg/dL   Calcium 10.0  8.4 - 10.5 mg/dL   GFR calc non Af Amer 86 (*) >90 mL/min   GFR calc Af Amer >90  >90 mL/min   Comment: (NOTE)     The eGFR has been calculated using the CKD EPI equation.     This calculation has not been validated in all clinical situations.     eGFR's persistently <90 mL/min signify possible Chronic Kidney     Disease.  BASIC METABOLIC PANEL     Status: Abnormal   Collection Time    09/06/13  5:12 AM      Result Value Ref Range   Sodium 137  137 - 147 mEq/L   Potassium 5.5 (*) 3.7 - 5.3 mEq/L   Chloride 99  96 - 112 mEq/L   CO2 21  19 - 32 mEq/L  Glucose, Bld 139 (*) 70 - 99 mg/dL   BUN 41 (*) 6 - 23 mg/dL   Creatinine, Ser 0.74  0.50 - 1.35 mg/dL   Calcium 10.3  8.4 - 10.5 mg/dL   GFR calc non Af Amer >90  >90 mL/min   GFR calc Af Amer >90  >90 mL/min   Comment: (NOTE)     The eGFR has been calculated using the CKD EPI equation.     This calculation has not been validated in all clinical situations.     eGFR's persistently <90 mL/min signify possible Chronic Kidney     Disease.      General: No acute distress Mood and affect are appropriate Heart: Regular rate and rhythm no rubs murmurs or extra sounds Lungs: Clear to auscultation, breathing unlabored, no rales or wheezes Abdomen: Positive bowel sounds, soft nontender to palpation, nondistended Extremities: no edema, atrophy in RUE with fasiculations in EDC, BLE atrophy in thigh , leg and feet Skin: No evidence of breakdown, no evidence of  rash Neurologic: Cranial nerves II through XII intact, motor strength is 0/5 in RUE 4/5 in left deltoid, bicep, tricep, grip, 3-/5 bilateral hip flexor,0/5 in Bilateral  knee extensors, ankle dorsiflexor and plantar flexor, increased tone LUE, decreased tone RUE and BLE Sensory exam limited by language  Musculoskeletal: Full passive range of motion in all 4 extremities.nopain to palp Left Achilles or left foot, no pain with passive ankle DF  Frog leg posture  No joint swelling, has dependant edema distal to [facility]   Assessment/Plan: 1. 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 Physiatrist is providing close team supervision and 24 hour management of active medical problems listed below. Physiatrist and rehab team continue to assess barriers to discharge/monitor patient progress toward functional and medical goals. FIM: FIM - Bathing Bathing Steps Patient Completed: Chest;Right Arm;Abdomen;Front perineal area;Right upper leg;Left upper leg Bathing: 1: Two helpers  FIM - Upper Body Dressing/Undressing Upper body dressing/undressing steps patient completed: Thread/unthread right sleeve of pullover shirt/dresss Upper body dressing/undressing: 1: Total-Patient completed less than 25% of tasks FIM - Lower Body Dressing/Undressing Lower body dressing/undressing: 1: Two helpers  FIM - Toileting Toileting: 0: Activity did not occur  FIM - Radio producer Devices:  (commode bench) Toilet Transfers: 0-Activity did not occur  FIM - Control and instrumentation engineer Devices: Sliding board;Arm rests Bed/Chair Transfer: 2: Supine > Sit: Max A (lifting assist/Pt. 25-49%);1: Two helpers;2: Sit > Supine: Max A (lifting assist/Pt. 25-49%)  FIM - Locomotion:  Wheelchair Locomotion: Wheelchair: 0: Activity did not occur FIM - Locomotion: Ambulation Locomotion: Ambulation: 0: Activity did not occur  Comprehension Comprehension Mode: Auditory Comprehension: 3-Understands basic 50 - 74% of the time/requires cueing 25 - 50%  of the time  Expression Expression Mode: Verbal Expression: 3-Expresses basic 50 - 74% of the time/requires cueing 25 - 50% of the time. Needs to repeat parts of sentences.  Social Interaction Social Interaction: 3-Interacts appropriately 50 - 74% of the time - May be physically or verbally inappropriate.  Problem Solving Problem Solving: 4-Solves basic 75 - 89% of the time/requires cueing 10 - 24% of the time  Memory Memory: 2-Recognizes or recalls 25 - 49% of the time/requires cueing 51 - 75% of the time   Medical Problem List and Plan:  1. Functional deficits secondary  to [place], embolic infarcts in a patient with post-polio syndrome  2. DVT Prophylaxis/Anticoagulation: Pharmaceutical: Lovenox  3. Pain Management: Used naprosyn bid at home. May need to schedule tylenol--monitor for now.  4. Mood: Provide ego support. LCSW to follow for evaluation and support.  5. Neuropsych: This patient is is not capable of making decisions on his own behalf. Defer financial decisions to POA 6. HTN: mainly systolic Will monitor BP every 8 hours and adjust medication as needed for tighter control. Continue lisinopril and d/c HCTZ due to elevated BUN, IVF qhs  8. 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 Routine pressure relief measures-- will order air mattress overlay.  9.  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  Left achilles tendinitis tx with PRAFO and voltaren gel, on medrol for suspected gout flare, urate minimally elevated , no  gouty arthropathy noted,  ankle film shows OA, foot xray unremarkable No pain on exam left foot and ankle, may resume WBAT LOS (Days) 10 A FACE TO FACE EVALUATION WAS PERFORMED  Charlett Blake 09/07/2013, 7:41 AM

## 2013-09-07 NOTE — Progress Notes (Addendum)
Occupational Therapy Session Note  Patient Details  Name: [name] MRN: [id number] Date of Birth: 01/17/1951  Today's Date: 09/07/2013 Time: 0907-1004 Time Calculation (min): 57 min  SESSION 2: Time: 13:45-14:10 Time Calculation (min) 25 mins  Short Term Goals: Week 1:  OT Short Term Goal 1 (Week 1): Pt will maintain static sitting EOB with min assist for 3 mins in preparation for selfcare tasks. OT Short Term Goal 1 - Progress (Week 1): Not met OT Short Term Goal 2 (Week 1): Pt will self feed using the LUE primarily with AE and supervision. OT Short Term Goal 2 - Progress (Week 1): Not met OT Short Term Goal 3 (Week 1): Pt will perform 3 grooming tasks in supported sitting at the sink with supervision. OT Short Term Goal 3 - Progress (Week 1): Not met OT Short Term Goal 4 (Week 1): Pt will perform UB bathing with min assist in supported sitting.  OT Short Term Goal 4 - Progress (Week 1): Met OT Short Term Goal 5 (Week 1): Pt will perform toilet transfers to drop arm commode vs commode bench with total assist using sliding board.  OT Short Term Goal 5 - Progress (Week 1): Not met Week 2:  OT Short Term Goal 1 (Week 2): Pt will maintain static sitting EOB with min assist for 3 mins in preparation for selfcare tasks. OT Short Term Goal 2 (Week 2): Pt will self feed using the LUE primarily with AE and supervision. OT Short Term Goal 3 (Week 2): Pt will perform 3 grooming tasks in supported sitting at the sink with supervision. OT Short Term Goal 4 (Week 2): Pt will perform toilet transfers to drop arm commode vs commode bench with total assist using sliding board.  OT Short Term Goal 5 (Week 2): Pt will roll side to side in bed to assist with peri hygiene with min assist to both the left and right.   Skilled Therapeutic Interventions/Progress Updates:    Pt performed bathing and dressing during session.  Performed peri bathing and donning incontinence brief in the bed with mod assist  for rolling side to side.  He was able to transfer from supine to sit EOB with total assist, needing max assist to maintain sitting balance EOB.  Transferred to the wheelchair with total assist +2 (pt 20%) using the sliding board.  Pt worked on bathing at the sink in supported sitting he needs max instructional cueing to sequence and mod facilitation to come forward far enough to reach his washcloth for bathing.  Attempted standing but pt needing total assist +2 (pt 10%) when attempting to pull pants over his hips.    Session 2: Co-tx with PT this session. Pt transferred on the therapy mat from the wheelchair with total assist +2 (pt 20%).  Worked on static and dynamic sitting balance EOM as well as maintaining anterior pelvic tilt and trunk extension with all transitional movements.  He needs max assist to reach forward and grasp object and return to upright sitting.  Pt relies heavily on his UEs for support, with the left being stronger then the right.  Increased fear still present with forward weightshifting.  Pt also demonstrates decreased lateral weightshifts for scooting as well.  When attempting functional sliding board transfers he tends to want to stay sitting upright, and when assisted with trunk flexion, wants to reach out with his LUE for support instead of assisting with the transfer.  Attempted to work on pt bringing his head forward  with UEs supported on the mat to simulate this.  Pt still needing max assist for forward weightshift and return to upright position.     Therapy Documentation Precautions:  Precautions Precautions: Fall Precaution Comments: Limited WB on LLE due to possible gout Restrictions Weight Bearing Restrictions: Yes LLE Weight Bearing: Partial weight bearing  Pain: Pain Assessment Pain Assessment: Faces Faces Pain Scale: Hurts a little bit Pain Type: Acute pain Pain Location: Ankle Pain Orientation: Left Pain Intervention(s): Repositioned;Ambulation/increased  activity ADL: See FIM for current functional status  Therapy/Group: Individual Therapy  Cindra Presume OTR/L 09/07/2013, 2:47 PM

## 2013-09-07 NOTE — Progress Notes (Signed)
Physical Therapy Session Note  Patient Details  Name: [name] MRN: [id number] Date of Birth: Dec 29, 1950  Today's Date: 09/07/2013 Time: 1410-1430 Time Calculation (min): 20 min  Short Term Goals: Week 2:  PT Short Term Goal 1 (Week 2): Pt will perform bed mobility with mod A of one person PT Short Term Goal 2 (Week 2): Pt will perform bed <> w/c transfers with max A of one person PT Short Term Goal 3 (Week 2): Pt will perform dynamic sitting balance activities with mod A x 10 minutes PT Short Term Goal 4 (Week 2): Pt will tolerated supported standing (standing frame or tilt table) for LE and core strengthening x 5 minutes with total A  Skilled Therapeutic Interventions/Progress Updates:   Co-tx with OT this session. Pt transferred on the therapy mat to/from the wheelchair with total assist +2 (pt 20%). Worked on static and dynamic sitting balance EOM as well as maintaining anterior pelvic tilt and trunk extension with all transitional movements. He needs max assist to reach forward and grasp object and return to upright sitting. Pt relies heavily on his UEs for support, with the left being stronger then the right. Increased fear still present with forward weightshifting. Pt also demonstrates decreased lateral weightshifts for scooting as well. When attempting functional sliding board transfers he tends to want to stay sitting upright, and when assisted with trunk flexion, wants to reach out with his LUE for support instead of assisting with the transfer. Attempted to work on pt bringing his head forward with UEs supported on the mat to simulate this. Pt still needing max assist for forward weightshift and return to upright position.  Continue to provide manual facilitation for increased forward weight shift and upright head posture during transfer.  Pt assisted back to room and left in w/c with quick release belt donned and all needs in reach.    Therapy Documentation Precautions:   Precautions Precautions: Fall Precaution Comments: Limited WB on LLE due to possible gout Restrictions Weight Bearing Restrictions: Yes LLE Weight Bearing: Partial weight bearing Vital Signs: Therapy Vitals Temp: 98.2 F (36.8 C) Temp src: Oral Pulse Rate: 82 Resp: 18 BP: 135/73 mmHg Patient Position (if appropriate): Sitting Oxygen Therapy SpO2: 99 % O2 Device: None (Room air) Pain: Pain Assessment Pain Assessment: Faces Faces Pain Scale: Hurts a little bit Pain Type: Acute pain Pain Location: Ankle Pain Orientation: Left Pain Intervention(s): Repositioned;Ambulation/increased activity     See FIM for current functional status  Therapy/Group: Co-Treatment w/ OT  Perfecto Purdy A Kapil Petropoulos 09/07/2013, 3:48 PM

## 2013-09-07 NOTE — Progress Notes (Signed)
Physical Therapy Session Note  Patient Details  Name: [name] MRN: [id number] Date of Birth: 04-22-1950  Today's Date: 09/07/2013 Time: 1115-1202 Time Calculation (min): 47 min  Short Term Goals: Week 2:  PT Short Term Goal 1 (Week 2): Pt will perform bed mobility with mod A of one person PT Short Term Goal 2 (Week 2): Pt will perform bed <> w/c transfers with max A of one person PT Short Term Goal 3 (Week 2): Pt will perform dynamic sitting balance activities with mod A x 10 minutes PT Short Term Goal 4 (Week 2): Pt will tolerated supported standing (standing frame or tilt table) for LE and core strengthening x 5 minutes with total A  Skilled Therapeutic Interventions/Progress Updates:   Pt received sitting in w/c in room, interpretor present during session to assist with translation.  Assisted pt to therapy gym and performed slideboard transfer w/c <> mat at +2 total assist (pt assist 20%).  Continues to demonstrate decreased ability to lean forward during transfer, requiring +2 for safety.  Provided max verbal cues as well as manual facilitation at ribs/trunk for upright posture and forward trunk lean at hips during transfer.  Once on EOM, worked on 3 reps of standing with use of mirror for increased visual feedback.  Left B PRAFO's on during standing for better foot positioning.  Pt assist approx 10% with +2 assist using "three muskateer style" however pt unable to activate trunk/hip extensors during standing and also demonstrates increase pain in LLE during all reps of standing.  Ended session with dynamic sitting balance with forward trunk leans and reaching forward for targets.  Again, provided manual contact at hips/ribs for anterior pelvic tilt and scapular retraction, but pt unable to maintain and requires max to total assist once outside of BOS.  Transferred back to w/c as stated above and assisted back to room.  Pt left in w/c with all needs in reach and quick release belt donned.    Therapy Documentation Precautions:  Precautions Precautions: Fall Precaution Comments: Limited WB on LLE due to possible gout Restrictions Weight Bearing Restrictions: Yes LLE Weight Bearing: Partial weight bearing   Vital Signs: Therapy Vitals Temp: 98.2 F (36.8 C) Temp src: Oral Pulse Rate: 82 Resp: 18 BP: 135/73 mmHg Patient Position (if appropriate): Sitting Oxygen Therapy SpO2: 99 % O2 Device: None (Room air) Pain: Pain Assessment Pain Assessment: Faces Faces Pain Scale: Hurts a little bit Pain Type: Acute pain Pain Location: Ankle Pain Orientation: Left Pain Intervention(s): Repositioned;Ambulation/increased activity     See FIM for current functional status  Therapy/Group: Individual Therapy  Rosey Bath [name] 09/07/2013, 4:28 PM

## 2013-09-08 ENCOUNTER — Inpatient Hospital Stay (HOSPITAL_COMMUNITY): Payer: Medicare Other

## 2013-09-08 LAB — BASIC METABOLIC PANEL
BUN: 66 mg/dL — ABNORMAL HIGH (ref 6–23)
CHLORIDE: 105 meq/L (ref 96–112)
CO2: 21 meq/L (ref 19–32)
CREATININE: 0.8 mg/dL (ref 0.50–1.35)
Calcium: 9.1 mg/dL (ref 8.4–10.5)
GFR calc Af Amer: >90 mL/min (ref >90)
GFR calc non Af Amer: >90 mL/min (ref >90)
GLUCOSE: 121 mg/dL — AB (ref 70–99)
Potassium: 4.8 mEq/L (ref 3.7–5.3)
Sodium: 140 mEq/L (ref 137–147)

## 2013-09-08 NOTE — Progress Notes (Signed)
Physical Therapy Session Note  Patient Details  Name: [name] MRN: [id number] Date of Birth: Jul 02, 1950  Today's Date: 09/08/2013 Time:0935-1003, 28 min  -    Short Term Goals: Week 2:  PT Short Term Goal 1 (Week 2): Pt will perform bed mobility with mod A of one person PT Short Term Goal 2 (Week 2): Pt will perform bed <> w/c transfers with max A of one person PT Short Term Goal 3 (Week 2): Pt will perform dynamic sitting balance activities with mod A x 10 minutes PT Short Term Goal 4 (Week 2): Pt will tolerated supported standing (standing frame or tilt table) for LE and core strengthening x 5 minutes with total A    Skilled Therapeutic Interventions/Progress Updates:  No pain communicated.  Max assist supine> sit.  +2 total assist SBT bed> w/c to R.  Pt pushing backwards with L hand, unable to lean forward to assist with transfer.  neuromuscular re-education via VCs, manual cues in sitting for RLE hip and knee ext, ankle PF from mass flexed position; r hip abd/assisted add; short arc quad knee ext to target ; 10 x 1 each.  Quick release belt applied and all needs left within reach , pt up in w/c in room.     Therapy Documentation Precautions:  Precautions Precautions: Fall Precaution Comments: Limited WB on LLE due to possible gout Restrictions Weight Bearing Restrictions: Yes LLE Weight Bearing: Partial weight bearing          See FIM for current functional status  Therapy/Group: Individual Therapy  Frederic Jericho 09/08/2013, 9:52 AM

## 2013-09-08 NOTE — Progress Notes (Signed)
Subjective/Complaints: 63 y.o. 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 CT head with acute Intraparenchymal hemorrhage right basal ganglia. He had increase in lethargy on 05/14 and follow up CCT with additional multifocal ischemic infarcts involving bilateral parietal and temporal lobes as well as right occipital lobe. 2D echo with EF 65-70% with no wall abnormality. Carotid dopplers without ICA stenosis. Patient with resultant paraparesis as well as LUE weakness, apraxia, decreased verbal output with naming errors as well as difficulty with multistep commands  Now in [place] PRAFO No interpreter this am Review of Systems - limited, poor awareness of def Objective: Vital Signs: Blood pressure 137/69, pulse 85, temperature 97.5 F (36.4 C), temperature source Oral, resp. rate 20, height 5' (1.524 m), weight 58.6 kg (129 lb 3 oz), SpO2 100.00%. No results found. Results for orders placed during the hospital encounter of 08/28/13 (from the past 72 hour(s))  BASIC METABOLIC PANEL     Status: Abnormal   Collection Time    09/06/13  5:12 AM      Result Value Ref Range   Sodium 137  137 - 147 mEq/L   Potassium 5.5 (*) 3.7 - 5.3 mEq/L   Chloride 99  96 - 112 mEq/L   CO2 21  19 - 32 mEq/L   Glucose, Bld 139 (*) 70 - 99 mg/dL   BUN 41 (*) 6 - 23 mg/dL   Creatinine, Ser 0.74  0.50 - 1.35 mg/dL   Calcium 10.3  8.4 - 10.5 mg/dL   GFR calc non Af Amer >90  >90 mL/min   GFR calc Af Amer >90  >90 mL/min   Comment: (NOTE)     The eGFR has been calculated using the CKD EPI equation.     This calculation has not been validated in all clinical situations.     eGFR's persistently <90 mL/min signify possible Chronic Kidney   Disease.  BASIC METABOLIC PANEL     Status: Abnormal   Collection Time    09/08/13  5:31 AM      Result Value Ref Range   Sodium 140  137 - 147 mEq/L   Potassium 4.8  3.7 - 5.3 mEq/L   Chloride 105  96 - 112 mEq/L   CO2 21  19 - 32 mEq/L   Glucose, Bld 121 (*) 70 - 99 mg/dL   BUN 66 (*) 6 - 23 mg/dL   Creatinine, Ser 0.80  0.50 - 1.35 mg/dL   Calcium 9.1  8.4 - 10.5 mg/dL   GFR calc non Af Amer >90  >90 mL/min   GFR calc Af Amer >90  >90 mL/min   Comment: (NOTE)     The eGFR has been calculated using the CKD EPI equation.     This calculation has not been validated in all clinical situations.     eGFR's persistently <90 mL/min signify possible Chronic Kidney     Disease.      General: No acute distress Mood and affect are appropriate Heart: Regular rate and rhythm no rubs murmurs or extra sounds Lungs: Clear to auscultation, breathing unlabored, no rales or wheezes Abdomen: Positive bowel sounds, soft nontender to palpation, nondistended Extremities: no edema, atrophy in RUE with fasiculations in [place],  BLE atrophy in thigh , leg and feet Skin: No evidence of breakdown, no evidence of rash Neurologic: Cranial nerves II through XII intact, motor strength is 0/5 in RUE 4/5 in left deltoid, bicep, tricep, grip, 3-/5 bilateral hip flexor,0/5 in Bilateral  knee extensors, ankle dorsiflexor and plantar flexor, increased tone LUE, decreased tone RUE and BLE Sensory exam limited by language  Musculoskeletal: Full passive range of motion in all 4 extremities.nopain to palp Left Achilles or left foot, no pain with passive ankle DF  Frog leg posture  No joint swelling, has dependant edema distal to [facility]   Assessment/Plan: 1. 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 Physiatrist is providing close  team supervision and 24 hour management of active medical problems listed below. Physiatrist and rehab team continue to assess barriers to discharge/monitor patient progress toward functional and medical goals. FIM: FIM - Bathing Bathing Steps Patient Completed: Chest;Right Arm;Abdomen;Front perineal area;Right upper leg;Left upper leg Bathing: 1: Total-Patient completes 0-2 of 10 parts or less than 25%  FIM - Upper Body Dressing/Undressing Upper body dressing/undressing steps patient completed: Thread/unthread right sleeve of pullover shirt/dresss Upper body dressing/undressing: 1: Total-Patient completed less than 25% of tasks FIM - Lower Body Dressing/Undressing Lower body dressing/undressing: 1: Two helpers  FIM - Toileting Toileting: 0: Activity did not occur  FIM - Radio producer Devices:  (commode bench) Toilet Transfers: 0-Activity did not occur  FIM - Control and instrumentation engineer Devices: Sliding board;Arm rests Bed/Chair Transfer: 1: Two helpers  FIM - Locomotion: Wheelchair Locomotion: Wheelchair: 0: Activity did not occur FIM - Locomotion: Ambulation Locomotion: Ambulation: 0: Activity did not occur  Comprehension Comprehension Mode: Auditory Comprehension: 4-Understands basic 75 - 89% of the time/requires cueing 10 - 24% of the time  Expression Expression Mode: Verbal Expression: 3-Expresses basic 50 - 74% of the time/requires cueing 25 - 50% of the time. Needs to repeat parts of sentences.  Social Interaction Social Interaction: 4-Interacts appropriately 75 - 89% of the time - Needs redirection for appropriate language or to initiate interaction.  Problem Solving Problem Solving: 4-Solves basic 75 - 89% of the time/requires cueing 10 - 24% of the time  Memory Memory: 3-Recognizes or recalls 50 - 74% of the time/requires cueing 25 - 49% of the time   Medical Problem List and Plan:  1. Functional deficits  secondary to [place], embolic infarcts in a patient with post-polio syndrome  2. DVT Prophylaxis/Anticoagulation: Pharmaceutical: Lovenox  3. Pain Management: Used naprosyn bid at home. May need to schedule tylenol--monitor for now.  4. Mood: Provide ego support. LCSW to follow for evaluation and support.  5. Neuropsych: This patient is is not capable of making decisions on his own behalf. Defer financial decisions to POA 6. HTN: mainly systolic Will monitor BP every 8 hours and adjust medication as needed for tighter control. Continue lisinopril and d/c HCTZ due to elevated BUN, IVF qhs  8. 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 Routine pressure relief measures-- will order air mattress overlay.  9.  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  Left achilles tendinitis tx  with PRAFO and voltaren gel, on medrol for suspected gout flare, urate minimally elevated , no gouty arthropathy noted,  ankle film shows OA, foot xray unremarkable No pain on exam left foot and ankle, may resume WBAT LOS (Days) 11 A FACE TO FACE EVALUATION WAS PERFORMED  Charlett Blake 09/08/2013, 10:52 AM

## 2013-09-09 ENCOUNTER — Inpatient Hospital Stay (HOSPITAL_COMMUNITY): Payer: Medicare Other

## 2013-09-09 ENCOUNTER — Encounter (HOSPITAL_COMMUNITY): Payer: Medicare Other

## 2013-09-09 NOTE — Progress Notes (Signed)
Subjective/Complaints: 63 y.o. 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 CT head with acute Intraparenchymal hemorrhage right basal ganglia. He had increase in lethargy on 05/14 and follow up CCT with additional multifocal ischemic infarcts involving bilateral parietal and temporal lobes as well as right occipital lobe. 2D echo with EF 65-70% with no wall abnormality. Carotid dopplers without ICA stenosis. Patient with resultant paraparesis as well as LUE weakness, apraxia, decreased verbal output with naming errors as well as difficulty with multistep commands   Smiling, limited English plus severe cog defs No interpreter this am Review of Systems - limited, poor awareness of def Objective: Vital Signs: Blood pressure 175/89, pulse 45, temperature 97.6 F (36.4 C), temperature source Oral, resp. rate 18, height 5' (1.524 m), weight 58.6 kg (129 lb 3 oz), SpO2 100.00%. No results found. Results for orders placed during the hospital encounter of 08/28/13 (from the past 72 hour(s))  BASIC METABOLIC PANEL     Status: Abnormal   Collection Time    09/08/13  5:31 AM      Result Value Ref Range   Sodium 140  137 - 147 mEq/L   Potassium 4.8  3.7 - 5.3 mEq/L   Chloride 105  96 - 112 mEq/L   CO2 21  19 - 32 mEq/L   Glucose, Bld 121 (*) 70 - 99 mg/dL   BUN 66 (*) 6 - 23 mg/dL   Creatinine, Ser 0.80  0.50 - 1.35 mg/dL   Calcium 9.1  8.4 - 10.5 mg/dL   GFR calc non Af Amer >90  >90 mL/min   GFR calc Af Amer >90  >90 mL/min   Comment: (NOTE)     The eGFR has been calculated using the CKD EPI equation.     This calculation has not been validated in all clinical situations.     eGFR's persistently <90 mL/min signify  possible Chronic Kidney     Disease.      General: No acute distress Mood and affect are appropriate Heart: Regular rate and rhythm no rubs murmurs or extra sounds Lungs: Clear to auscultation, breathing unlabored, no rales or wheezes Abdomen: Positive bowel sounds, soft nontender to palpation, nondistended Extremities: no edema, atrophy in RUE with fasiculations in [facility], BLE atrophy in thigh , leg and feet Skin: No evidence of breakdown, no evidence of rash Neurologic: Cranial nerves II through XII intact, motor strength is 0/5 in RUE 4/5 in left deltoid, bicep, tricep, grip, 3-/5 bilateral hip flexor,0/5 in Bilateral  knee extensors, ankle dorsiflexor and plantar flexor, increased tone LUE, decreased tone RUE and BLE Sensory exam limited by language  Musculoskeletal: Full passive range of motion in all 4 extremities.nopain to palp Left Achilles or left foot, no pain with passive ankle DF  Frog leg posture  No joint swelling, has dependant edema distal to [facility]   Assessment/Plan: 1. 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 Physiatrist is providing close team supervision and 24 hour management of active medical problems listed below. Physiatrist and rehab team continue to assess barriers to discharge/monitor  patient progress toward functional and medical goals. FIM: FIM - Bathing Bathing Steps Patient Completed: Chest;Right Arm;Abdomen;Front perineal area;Right upper leg;Left upper leg Bathing: 1: Two helpers  FIM - Upper Body Dressing/Undressing Upper body dressing/undressing steps patient completed: Thread/unthread right sleeve of pullover shirt/dresss Upper body dressing/undressing: 1: Two helpers FIM - Lower Body Dressing/Undressing Lower body dressing/undressing: 1: Two helpers  FIM -  Toileting Toileting: 0: Activity did not occur  FIM - Radio producer Devices:  (commode bench) Toilet Transfers: 0-Activity did not occur  FIM - Financial planner Transfer: 1: Two helpers  FIM - Locomotion: Wheelchair Locomotion: Wheelchair: 1: Total Assistance/staff pushes wheelchair (Pt<25%) FIM - Locomotion: Ambulation Locomotion: Ambulation: 0: Activity did not occur  Comprehension Comprehension Mode: Auditory Comprehension: 4-Understands basic 75 - 89% of the time/requires cueing 10 - 24% of the time  Expression Expression Mode: Verbal Expression: 3-Expresses basic 50 - 74% of the time/requires cueing 25 - 50% of the time. Needs to repeat parts of sentences.  Social Interaction Social Interaction: 4-Interacts appropriately 75 - 89% of the time - Needs redirection for appropriate language or to initiate interaction.  Problem Solving Problem Solving: 4-Solves basic 75 - 89% of the time/requires cueing 10 - 24% of the time  Memory Memory: 3-Recognizes or recalls 50 - 74% of the time/requires cueing 25 - 49% of the time   Medical Problem List and Plan:  1. Functional deficits secondary to [place], embolic infarcts in a patient with post-polio syndrome  2. DVT Prophylaxis/Anticoagulation: Pharmaceutical: Lovenox  3. Pain Management: Used naprosyn bid at home. May need to schedule tylenol--monitor for now.  4. Mood: Provide ego support. LCSW to follow for evaluation and support.  5. Neuropsych: This patient is is not capable of making decisions on his own behalf. Defer financial decisions to POA 6. HTN: mainly systolic Will monitor BP every 8 hours and adjust medication as needed for tighter control. Continue lisinopril and d/c HCTZ due to elevated BUN, IVF qhs, repeat BMET normal  8. Post polio syndrome with chronic right upper and bilateral lower weakness: Has chronic dry pressure ulcer  on right lateral knee. Routine pressure relief measures-- will order air mattress overlay.  9.  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  Left achilles tendinitis tx with PRAFO and voltaren gel, on medrol for suspected gout flare, urate minimally elevated , no gouty arthropathy noted,  ankle film shows OA, foot xray unremarkable No pain on exam left foot and ankle, may resume WBAT LOS (Days) 12 A FACE TO FACE EVALUATION WAS PERFORMED  Charlett Blake 09/09/2013, 8:23 AM

## 2013-09-09 NOTE — Progress Notes (Signed)
Occupational Therapy Session Note  Patient Details  Name: [name] MRN: [id number] Date of Birth: 22-Aug-1950  Today's Date: 09/09/2013 Time:  -  0845-1010  (85 min)    Short Term Goals: Week 1:  OT Short Term Goal 1 (Week 1): Pt will maintain static sitting EOB with min assist for 3 mins in preparation for selfcare tasks. OT Short Term Goal 1 - Progress (Week 1): Not met OT Short Term Goal 2 (Week 1): Pt will self feed using the LUE primarily with AE and supervision. OT Short Term Goal 2 - Progress (Week 1): Not met OT Short Term Goal 3 (Week 1): Pt will perform 3 grooming tasks in supported sitting at the sink with supervision. OT Short Term Goal 3 - Progress (Week 1): Not met OT Short Term Goal 4 (Week 1): Pt will perform UB bathing with min assist in supported sitting.  OT Short Term Goal 4 - Progress (Week 1): Met OT Short Term Goal 5 (Week 1): Pt will perform toilet transfers to drop arm commode vs commode bench with total assist using sliding board.  OT Short Term Goal 5 - Progress (Week 1): Not met  Skilled Therapeutic Interventions/Progress Updates:    Addressed bed mobility, sitting balance, transfers,  BUE therapeutic activities.  Pt. Supine upon arrival.  Pt rolled with mod assist.  He was able to transfer from supine to sit EOB with total assist, needing max assist to maintain sitting balance EOB. Transferred to the wheelchair with total assist +2 (pt 20%) using the sliding board. Pt worked on bathing at the sink in supported sitting he needs max instructional cueingfor initiating tasks.  Pt. Brushed teeth with mod assist.  Left in wc with safety belt on and NT  Called to assist with breakfast.      Therapy Documentation Precautions:  Precautions Precautions: Fall Precaution Comments: Limited WB on LLE due to possible gout Restrictions Weight Bearing Restrictions: Yes LLE Weight Bearing: Partial weight bearing     Pain:  none         Other Treatments:    See  FIM for current functional status  Therapy/Group: Individual Therapy  Lisa Roca 09/09/2013, 9:01 AM

## 2013-09-09 NOTE — Progress Notes (Signed)
Physical Therapy Session Note  Patient Details  Name: [name] MRN: [id number] Date of Birth: 1950-12-28  Today's Date: 09/09/2013 Time: 1410-1500 Time Calculation (min): 50 min  Short Term Goals: Week 2:  PT Short Term Goal 1 (Week 2): Pt will perform bed mobility with mod A of one person PT Short Term Goal 2 (Week 2): Pt will perform bed <> w/c transfers with max A of one person PT Short Term Goal 3 (Week 2): Pt will perform dynamic sitting balance activities with mod A x 10 minutes PT Short Term Goal 4 (Week 2): Pt will tolerated supported standing (standing frame or tilt table) for LE and core strengthening x 5 minutes with total A  Skilled Therapeutic Interventions/Progress Updates:    Pt received supine in bed and agreeable to therapy. Pt completed rolling towards left and right sides with use of bed rails with mod A and mod verbal/tactile cues x3 reps each side. Pt completed supine to sit transfer to right side with max A of one. Upon sitting EOB pt requires mod to max A to maintain sitting balance secondary to posterior lean. Pt fearful of flexion, possible fear of falling? Pt set up for slide board transfer to the left from bed to w/c. Pt completed slide board transfer, initially with max A of 1, but required total A of 2 at end of transfer secondary to heavy posterior lean and pt sliding forward. PT questioned pt if he was in pain and pt reported no. Pt able to assisted with repositioning in w/c. PT pushed pt in w/c to gym. Pt completed dynamic sitting activities sitting in w/c, promoting hip flexion with trunk extension. Pt completed peg board activity and cup stacking. Pt very attentive to peg board sustaining active sitting with reaching x8 min without a rest break. Pt returned to room, seated in w/c quick release belt in place. All needs met and within reach. Nsg informed. Therapy Documentation Precautions:  Precautions Precautions: Fall Precaution Comments: Limited WB on LLE due  to possible gout Restrictions Weight Bearing Restrictions: Yes LLE Weight Bearing: Partial weight bearing Vital Signs: Therapy Vitals Temp: 98.3 F (36.8 C) Temp src: Oral Pulse Rate: 68 Resp: 19 BP: 146/77 mmHg Patient Position (if appropriate): Sitting Oxygen Therapy SpO2: 99 % O2 Device: None (Room air) Pain: Pain Assessment Pain Assessment: No/denies pain Pain Score: 0-No pain      See FIM for current functional status  Therapy/Group: Individual Therapy  Viraat Vanpatten R Caelan Atchley 09/09/2013, 4:12 PM

## 2013-09-10 ENCOUNTER — Inpatient Hospital Stay (HOSPITAL_COMMUNITY): Payer: Medicare Other | Admitting: Rehabilitation

## 2013-09-10 ENCOUNTER — Inpatient Hospital Stay (HOSPITAL_COMMUNITY): Payer: Medicare Other | Admitting: Speech Pathology

## 2013-09-10 ENCOUNTER — Inpatient Hospital Stay (HOSPITAL_COMMUNITY): Payer: Medicare Other | Admitting: Occupational Therapy

## 2013-09-10 ENCOUNTER — Encounter (HOSPITAL_COMMUNITY): Payer: Medicare Other | Admitting: Occupational Therapy

## 2013-09-10 DIAGNOSIS — I619 Nontraumatic intracerebral hemorrhage, unspecified: Secondary | ICD-10-CM

## 2013-09-10 DIAGNOSIS — I6340 Cerebral infarction due to embolism of unspecified cerebral artery: Secondary | ICD-10-CM

## 2013-09-10 DIAGNOSIS — B91 Sequelae of poliomyelitis: Secondary | ICD-10-CM

## 2013-09-10 MED ORDER — AMLODIPINE BESYLATE 10 MG PO TABS
10.0000 mg | ORAL_TABLET | Freq: Every day | ORAL | Status: DC
  Administered 2013-09-10 – 2013-09-14 (×5): 10 mg via ORAL
  Filled 2013-09-10 (×6): qty 1

## 2013-09-10 NOTE — Progress Notes (Signed)
Occupational Therapy Session Note  Patient Details  Name: [name] MRN: [id number] Date of Birth: [birth year]  Today's Date: 09/10/2013 Time: 0902-1003 Time Calculation (min): 61 min  Short Term Goals: Week 2:  OT Short Term Goal 1 (Week 2): Pt will maintain static sitting EOB with min assist for 3 mins in preparation for selfcare tasks. OT Short Term Goal 2 (Week 2): Pt will self feed using the LUE primarily with AE and supervision. OT Short Term Goal 3 (Week 2): Pt will perform 3 grooming tasks in supported sitting at the sink with supervision. OT Short Term Goal 4 (Week 2): Pt will perform toilet transfers to drop arm commode vs commode bench with total assist using sliding board.  OT Short Term Goal 5 (Week 2): Pt will roll side to side in bed to assist with peri hygiene with min assist to both the left and right.   Skilled Therapeutic Interventions/Progress Updates:    Pt performed bathing and dressing supine to sit EOB.  He needed mod assist for rolling side to side in bed with donning brief and washing the peri area.  He then transitioned to sitting with total assist.  Max assist needed for initial sitting balance with increased LOB posteriorly.  Progressed to sitting statically with supervision.  Still needs max assist for dynamic sitting balance during bathing.  Total assist +2 for LB dressing sitting with lateral leans for pulling pants over hips.  Total assist +2 (pt 20%) for sliding board transfer to the wheelchair.  Pt performed grooming task of brushing his teeth with supervision after therapist setup toothbrush with toothpaste applied and foam grip over the toothbrush.   Therapy Documentation Precautions:  Precautions Precautions: Fall Precaution Comments: Limited WB on LLE due to possible gout Restrictions Weight Bearing Restrictions: Yes LLE Weight Bearing: Partial weight bearing  Pain: Pain Assessment Pain Assessment: No/denies pain ADL: See FIM for current  functional status  Therapy/Group: Individual Therapy  Cindra Presume OTR/L 09/10/2013, 12:19 PM

## 2013-09-10 NOTE — Progress Notes (Signed)
Physical Therapy Session Note  Patient Details  Name: Courvoisier Hamblen MRN: [id number] Date of Birth: 17-Jan-1951  Today's Date: 09/10/2013 Time: 4259-5638 Time Calculation (min): 57 min  Short Term Goals: Week 2:  PT Short Term Goal 1 (Week 2): Pt will perform bed mobility with mod A of one person PT Short Term Goal 2 (Week 2): Pt will perform bed <> w/c transfers with max A of one person PT Short Term Goal 3 (Week 2): Pt will perform dynamic sitting balance activities with mod A x 10 minutes PT Short Term Goal 4 (Week 2): Pt will tolerated supported standing (standing frame or tilt table) for LE and core strengthening x 5 minutes with total A  Skilled Therapeutic Interventions/Progress Updates:   Pt received sitting in w/c in room, agreeable to therapy, but was trying to make phone call.  Pt able to verbalize he wanted to call his wife, therefore attempted to have pt state the number, however he was unable to recall.  PT looked in computer to get number, however when dialed, note that phone number no longer in service.  Will attempt later and notify RN.  Assisted pt to/from therapy gym in order to work on functional transitional movements with rolling, reaching for targets in supine/SL for trunk dissociation and also sitting balance for upright posture, anterior pelvic tilt, shoulder retraction with mild forward weight shift.  Continues to demonstrate increased fear with transitional movements with therapist providing max assist and manual facilitation for flex/rotation during transitional movements.  Performed slideboard transfer w/c <> mat table with +2 assist (pt assist 20-30%) with small step under BLEs for increased support.  Pt demonstrating increased comfort with forward lean today, however was noted to be leaning on therapist, rather than actually controlling trunk in forward motion.  Provided max encouragement for pt to continue to problem solve through transfer, but did assist with hand placement  and LE placement for increase ease.  Pt transferred back to w/c as stated above and back to room.  Left in w/c with all needs in reach. Notified RN of request to get back in bed.  Quick release belt donned.   Therapy Documentation Precautions:  Precautions Precautions: Fall Precaution Comments: Limited WB on LLE due to possible gout Restrictions Weight Bearing Restrictions: Yes LLE Weight Bearing: Partial weight bearing   Pain: Pain Assessment Pain Assessment: No/denies pain     See FIM for current functional status  Therapy/Group: Individual Therapy  Raquel Sarna A Nimisha Rathel 09/10/2013, 3:57 PM

## 2013-09-10 NOTE — Progress Notes (Signed)
Speech Language Pathology Daily Session Note  Patient Details  Name: [name] MRN: [id number] Date of Birth: 09-Apr-1951  Today's Date: 09/10/2013 Time: 1320-1400 Time Calculation (min): 40 min  Short Term Goals: Week 2: SLP Short Term Goal 1 (Week 2): Patient will utilize external aids for orientation with Mod assist  SLP Short Term Goal 2 (Week 2): Patient will express phrase lenght utterances with Min sentence completion and question cues  Skilled Therapeutic Interventions: Skilled treatment session focused on addressing cognitive goals and eduction with daughter.  Patient required choice of three and for orientation with a significant delay and Min question cues.  Daughter present for session and asked many appropriate questions regarding her father's current status with memory and communication abilities.  She was concerned about his memory the most and had numerous questions regarding discharge; SLP directed her to case manager/social worker.  Continue with current plan of care tomorrow.    FIM:  Comprehension Comprehension Mode: Auditory Comprehension: 4-Understands basic 75 - 89% of the time/requires cueing 10 - 24% of the time Expression Expression Mode: Verbal Expression: 3-Expresses basic 50 - 74% of the time/requires cueing 25 - 50% of the time. Needs to repeat parts of sentences. Social Interaction Social Interaction: 3-Interacts appropriately 50 - 74% of the time - May be physically or verbally inappropriate. Problem Solving Problem Solving: 3-Solves basic 50 - 74% of the time/requires cueing 25 - 49% of the time Memory Memory: 2-Recognizes or recalls 25 - 49% of the time/requires cueing 51 - 75% of the time  Pain Pain Assessment Pain Assessment: No/denies pain  Therapy/Group: Individual Therapy  [name]., CCC-SLP [place] 09/10/2013, 3:00 PM

## 2013-09-10 NOTE — Progress Notes (Signed)
Subjective/Complaints: 63 y.o. 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 CT head with acute Intraparenchymal hemorrhage right basal ganglia. He had increase in lethargy on 05/14 and follow up CCT with additional multifocal ischemic infarcts involving bilateral parietal and temporal lobes as well as right occipital lobe.. Patient with resultant paraparesis as well as LUE weakness, apraxia, decreased verbal output with naming errors as well as difficulty with multistep commands   Smiling, limited English plus severe cog defs No interpreter this am Review of Systems - limited, poor awareness of def Objective: Vital Signs: Blood pressure 183/86, pulse 54, temperature 97.4 F (36.3 C), temperature source Oral, resp. rate 18, height 5' (1.524 m), weight 58.6 kg (129 lb 3 oz), SpO2 99.00%. No results found. Results for orders placed during the hospital encounter of 08/28/13 (from the past 72 hour(s))  BASIC METABOLIC PANEL     Status: Abnormal   Collection Time    09/08/13  5:31 AM      Result Value Ref Range   Sodium 140  137 - 147 mEq/L   Potassium 4.8  3.7 - 5.3 mEq/L   Chloride 105  96 - 112 mEq/L   CO2 21  19 - 32 mEq/L   Glucose, Bld 121 (*) 70 - 99 mg/dL   BUN 66 (*) 6 - 23 mg/dL   Creatinine, Ser 0.80  0.50 - 1.35 mg/dL   Calcium 9.1  8.4 - 10.5 mg/dL   GFR calc non Af Amer >90  >90 mL/min   GFR calc Af Amer >90  >90 mL/min   Comment: (NOTE)     The eGFR has been calculated using the CKD EPI equation.     This calculation has not been validated in all clinical situations.     eGFR's persistently <90 mL/min signify possible Chronic Kidney     Disease.      General: No acute distress Mood and affect are  appropriate Heart: Regular rate and rhythm no rubs murmurs or extra sounds Lungs: Clear to auscultation, breathing unlabored, no rales or wheezes Abdomen: Positive bowel sounds, soft nontender to palpation, nondistended Extremities: no edema, atrophy in RUE with fasiculations in EDC, BLE atrophy in thigh , leg and feet Skin: No evidence of breakdown, no evidence of rash Neurologic: Cranial nerves II through XII intact, motor strength is 0/5 in RUE 4/5 in left deltoid, bicep, tricep, grip, 3-/5 bilateral hip flexor,0/5 in Bilateral  knee extensors, ankle dorsiflexor and plantar flexor, increased tone LUE, decreased tone RUE and BLE Sensory exam limited by language  Musculoskeletal: Full passive range of motion in all 4 extremities.nopain to palp Left Achilles or left foot, no pain with passive ankle DF  Frog leg posture  No joint swelling, has dependant edema distal to [facility]   Assessment/Plan: 1. 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 Physiatrist is providing close team supervision and 24 hour management of active medical problems listed below. Physiatrist and rehab team continue to assess barriers to discharge/monitor patient progress toward functional and medical goals. FIM: FIM - Bathing Bathing Steps Patient  Completed: Chest;Right Arm;Abdomen;Front perineal area;Right upper leg;Left upper leg Bathing: 1: Two helpers  FIM - Upper Body Dressing/Undressing Upper body dressing/undressing steps patient completed: Thread/unthread right sleeve of pullover shirt/dresss Upper body dressing/undressing: 1: Two helpers FIM - Lower Body Dressing/Undressing Lower body dressing/undressing: 1: Two helpers  FIM - Toileting Toileting: 0: Activity did not occur  FIM - Engineer, structural Devices:  (commode bench) Toilet Transfers: 0-Activity did not occur  FIM - Control and instrumentation engineer Devices: Sliding board;Arm rests Bed/Chair Transfer: 1: Two helpers;2: Supine > Sit: Max A (lifting assist/Pt. 25-49%)  FIM - Locomotion: Wheelchair Locomotion: Wheelchair: 0: Activity did not occur FIM - Locomotion: Ambulation Locomotion: Ambulation: 0: Activity did not occur  Comprehension Comprehension Mode: Auditory Comprehension: 4-Understands basic 75 - 89% of the time/requires cueing 10 - 24% of the time  Expression Expression Mode: Verbal Expression: 3-Expresses basic 50 - 74% of the time/requires cueing 25 - 50% of the time. Needs to repeat parts of sentences.  Social Interaction Social Interaction: 4-Interacts appropriately 75 - 89% of the time - Needs redirection for appropriate language or to initiate interaction.  Problem Solving Problem Solving: 4-Solves basic 75 - 89% of the time/requires cueing 10 - 24% of the time  Memory Memory: 3-Recognizes or recalls 50 - 74% of the time/requires cueing 25 - 49% of the time   Medical Problem List and Plan:  1. Functional deficits secondary to [place], embolic infarcts in a patient with post-polio syndrome  2. DVT Prophylaxis/Anticoagulation: Pharmaceutical: Lovenox  3. Pain Management: Used naprosyn bid at home. May need to schedule tylenol--monitor for now.  4. Mood: Provide ego support. LCSW to follow for evaluation and support.  5. Neuropsych: This patient is is not capable of making decisions on his own behalf. Defer financial decisions to POA 6. HTN: mainly systolic Will monitor BP every 8 hours and adjust medication as needed for tighter control. Continue lisinopril and d/c HCTZ due to elevated BUN, IVF qhs, repeat BMET normal, increase amlodipine  8. 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 Routine  pressure relief measures-- will order air mattress overlay.  9.  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  Left achilles tendinitis tx with PRAFO and voltaren gel, on medrol for suspected gout flare, urate minimally elevated , no gouty arthropathy noted,  ankle film shows OA, foot xray unremarkable No pain on exam left foot and ankle,  WBAT LOS (Days) 13 A FACE TO FACE EVALUATION WAS PERFORMED  Charlett Blake 09/10/2013, 7:23 AM

## 2013-09-10 NOTE — Progress Notes (Signed)
Occupational Therapy Session Note  Patient Details  Name: [name] MRN: [id number] Date of Birth: [birth year]  Today's Date: 09/10/2013 Time: 6063-0160 Time Calculation (min): 27 min  Short Term Goals: Week 2:  OT Short Term Goal 1 (Week 2): Pt will maintain static sitting EOB with min assist for 3 mins in preparation for selfcare tasks. OT Short Term Goal 2 (Week 2): Pt will self feed using the LUE primarily with AE and supervision. OT Short Term Goal 3 (Week 2): Pt will perform 3 grooming tasks in supported sitting at the sink with supervision. OT Short Term Goal 4 (Week 2): Pt will perform toilet transfers to drop arm commode vs commode bench with total assist using sliding board.  OT Short Term Goal 5 (Week 2): Pt will roll side to side in bed to assist with peri hygiene with min assist to both the left and right.   Skilled Therapeutic Interventions/Progress Updates:    Pt worked on forward reach with the LUE while sitting supported in the wheelchair.  Pt utilized the LUE for picking up large pegs and placing them in a grid placed on the high/low table in front of him.  Incorporated forward trunk flexion with min assist for support and pt using his RUE on the arm of the chair for support.  Pt needed min to mod assist for reaching using the LUE and max assist for using the RUE.  Pt reported some dizziness when asked but BP was checked at 145/72.    Therapy Documentation Precautions:  Precautions Precautions: Fall Precaution Comments: Limited WB on LLE due to possible gout Restrictions Weight Bearing Restrictions: Yes LLE Weight Bearing: Partial weight bearing  Pain: Pain Assessment Pain Assessment: No/denies pain ADL: See FIM for current functional status  Therapy/Group: Individual Therapy  Cindra Presume OTR/L 09/10/2013, 4:30 PM

## 2013-09-11 ENCOUNTER — Inpatient Hospital Stay (HOSPITAL_COMMUNITY): Payer: Medicare Other | Admitting: Speech Pathology

## 2013-09-11 ENCOUNTER — Inpatient Hospital Stay (HOSPITAL_COMMUNITY): Payer: Medicare Other | Admitting: Occupational Therapy

## 2013-09-11 ENCOUNTER — Ambulatory Visit (HOSPITAL_COMMUNITY): Payer: Medicare Other | Admitting: Rehabilitation

## 2013-09-11 ENCOUNTER — Encounter (HOSPITAL_COMMUNITY): Payer: Medicare Other | Admitting: Occupational Therapy

## 2013-09-11 DIAGNOSIS — I6340 Cerebral infarction due to embolism of unspecified cerebral artery: Secondary | ICD-10-CM

## 2013-09-11 DIAGNOSIS — B91 Sequelae of poliomyelitis: Secondary | ICD-10-CM

## 2013-09-11 DIAGNOSIS — I619 Nontraumatic intracerebral hemorrhage, unspecified: Secondary | ICD-10-CM

## 2013-09-11 LAB — BASIC METABOLIC PANEL
BUN: 34 mg/dL — AB (ref 6–23)
CO2: 25 meq/L (ref 19–32)
CREATININE: 0.76 mg/dL (ref 0.50–1.35)
Calcium: 9.9 mg/dL (ref 8.4–10.5)
Chloride: 101 mEq/L (ref 96–112)
GLUCOSE: 131 mg/dL — AB (ref 70–99)
Potassium: 4.2 mEq/L (ref 3.7–5.3)
Sodium: 140 mEq/L (ref 137–147)

## 2013-09-11 NOTE — Progress Notes (Signed)
Occupational Therapy Session Note  Patient Details  Name: Deleon Passe MRN: [id number] Date of Birth: December 24, 1950  Today's Date: 09/11/2013 Time: 1000-1056 Time Calculation (min): 56 min  Short Term Goals: Week 2:  OT Short Term Goal 1 (Week 2): Pt will maintain static sitting EOB with min assist for 3 mins in preparation for selfcare tasks. OT Short Term Goal 2 (Week 2): Pt will self feed using the LUE primarily with AE and supervision. OT Short Term Goal 3 (Week 2): Pt will perform 3 grooming tasks in supported sitting at the sink with supervision. OT Short Term Goal 4 (Week 2): Pt will perform toilet transfers to drop arm commode vs commode bench with total assist using sliding board.  OT Short Term Goal 5 (Week 2): Pt will roll side to side in bed to assist with peri hygiene with min assist to both the left and right.   Skilled Therapeutic Interventions/Progress Updates:    Pt had already been bathed and dressed this session secondary to incontinent episode and already attending PT session.  Instead took pt over to the sink from the wheelchair and had him work on brushing his teeth.  He demonstrates decreased coordination in the left hand for quick brushing movements and instead compensates with head movements to assist with the brushing.  Mr. Medlen needed min assist with mod instructional cueing for opening toothpaste and applying it on his toothbrush.  Once he finished took him down to the gym via wheelchair and transferred to the therapy mat with total assist +2 (pt 30%) using the sliding board.  Worked on forward reaching task with one UE supported on the edge of the mat.  Pt needing mod facilitation for simple reaching just outside of base of support using the LUE.  Pt tends to do better forward weightshift if therapist is positioned in front of him.  Progressed to working on scooting up and down the mat in preparation for sliding board transfers.  Pt able to perform small scooting movements  with mod to max assist.  Transitioned to sliding board transfer back to the wheelchair with total assist +2 (pt 30%).      Therapy Documentation Precautions:  Precautions Precautions: Fall Precaution Comments: Limited WB on LLE due to possible gout Restrictions Weight Bearing Restrictions: Yes LLE Weight Bearing: Partial weight bearing  Pain: Pain Assessment Pain Assessment: No/denies pain ADL: See FIM for current functional status  Therapy/Group: Individual Therapy  Cindra Presume OTR/L 09/11/2013, 12:09 PM

## 2013-09-11 NOTE — Progress Notes (Signed)
Occupational Therapy Session Note  Patient Details  Name: [name] MRN: [id number] Date of Birth: July 01, 1950  Today's Date: 09/11/2013 Time: 1300-1329 Time Calculation (min): 29 min  Skilled Therapeutic Interventions/Progress Updates:    Pt incontinent of bladder and condom cathetor also leaking so pt transferred back to bed via slidingboard and total assist.  Performed rolling side to side with mod facilitation for removal of soiled clothing, cleaning peri area and pulling brief and shorts over his hips.  Pt able to wash his front peri area with setup of washcloth and HOB elevated upward.  Pt needing max facilitation for bending up either knee to initiate rolling but after doing so he is able to roll to the right with min assist and to the left with mod assist.  ..  Therapy Documentation Precautions:  Precautions Precautions: Fall Precaution Comments: Limited WB on LLE due to possible gout Restrictions Weight Bearing Restrictions: Yes LLE Weight Bearing: Partial weight bearing  Pain: Pain Assessment Pain Assessment: No/denies pain ADL: See FIM for current functional status  Therapy/Group: Individual Therapy  Cindra Presume OTR/L 09/11/2013, 1:31 PM

## 2013-09-11 NOTE — Progress Notes (Signed)
Recreational Therapy Session Note  Patient Details  Name: [name] MRN: [id number] Date of Birth: January 20, 1951 Today's Date: 09/11/2013  Pain: no c/o Skilled Therapeutic Interventions/Progress Updates: Session focused on activity tolerance, standing tolerance in standing frame, dynamic balance & UE use.  Pt required Total assist +2 for slide board transfer, pt =~20 %.  Pt stood ~15 minutes in standing frame for reaching activity requiring pt to weightshift & lift LUE off of table for task completion.  Pt required max assist to raise LUE off from tabletop.  Therapy/Group: Co-Treatment   Waldon Reining 09/11/2013, 4:32 PM

## 2013-09-11 NOTE — Progress Notes (Signed)
Speech Language Pathology Daily Session Note  Patient Details  Name: [name] MRN: [id number] Date of Birth: 08/31/1950  Today's Date: 09/11/2013 Time: 0093-8182 Time Calculation (min): 30 min  Short Term Goals: Week 2: SLP Short Term Goal 1 (Week 2): Patient will utilize external aids for orientation with Mod assist  SLP Short Term Goal 2 (Week 2): Patient will express phrase lenght utterances with Min sentence completion and question cues  Skilled Therapeutic Interventions: Skilled treatment session focused on addressing cognitive-linguistic goals.  Patient seen in room with interpreter present.  Patent verbalized orientation x4 with increased wait time and choice of three x1 for state.  Patient ordered lunch with choice of two-three with increased wait time. SLP requested patient verbally sequence how to go from sitting to standing with Max verbal cues faded to Mod cues at the end of the session.  Continue with current plan of care.     FIM:  Comprehension Comprehension Mode: Auditory Comprehension: 4-Understands basic 75 - 89% of the time/requires cueing 10 - 24% of the time Expression Expression Mode: Verbal Expression: 3-Expresses basic 50 - 74% of the time/requires cueing 25 - 50% of the time. Needs to repeat parts of sentences. Social Interaction Social Interaction: 3-Interacts appropriately 50 - 74% of the time - May be physically or verbally inappropriate. Problem Solving Problem Solving: 3-Solves basic 50 - 74% of the time/requires cueing 25 - 49% of the time Memory Memory: 2-Recognizes or recalls 25 - 49% of the time/requires cueing 51 - 75% of the time  Pain Pain Assessment Pain Assessment: No/denies pain  Therapy/Group: Individual Therapy  [name]., [place]  [name] 09/11/2013, 12:19 PM

## 2013-09-11 NOTE — Progress Notes (Signed)
Subjective/Complaints: 63 y.o. 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 CT head with acute Intraparenchymal hemorrhage right basal ganglia. He had increase in lethargy on 05/14 and follow up CCT with additional multifocal ischemic infarcts involving bilateral parietal and temporal lobes as well as right occipital lobe.. Patient with resultant paraparesis as well as LUE weakness, apraxia, decreased verbal output with naming errors as well as difficulty with multistep commands   Smiling, limited English plus severe cog defs No interpreter this am Review of Systems - limited, poor awareness of def Objective: Vital Signs: Blood pressure 157/84, pulse 51, temperature 97.8 F (36.6 C), temperature source Oral, resp. rate 18, height 5' (1.524 m), weight 58.6 kg (129 lb 3 oz), SpO2 98.00%. No results found. No results found for this or any previous visit (from the past 72 hour(s)).    General: No acute distress Mood and affect are appropriate Heart: Regular rate and rhythm no rubs murmurs or extra sounds Lungs: Clear to auscultation, breathing unlabored, no rales or wheezes Abdomen: Positive bowel sounds, soft nontender to palpation, nondistended Extremities: no edema, atrophy in RUE with fasiculations in EDC, BLE atrophy in thigh , leg and feet Skin: No evidence of breakdown, no evidence of rash Neurologic: Cranial nerves II through XII intact, motor strength is 0/5 in RUE 4/5 in left deltoid, bicep, tricep, grip, 3-/5 bilateral hip flexor,0/5 in Bilateral  knee extensors, ankle dorsiflexor and plantar flexor, increased tone LUE, decreased tone RUE and BLE Sensory exam limited by language  Musculoskeletal: Full passive  range of motion in all 4 extremities.nopain to palp Left Achilles or left foot, no pain with passive ankle DF  Frog leg posture  No joint swelling, has dependant edema distal to [facility]   Assessment/Plan: 1. 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 Physiatrist is providing close team supervision and 24 hour management of active medical problems listed below. Physiatrist and rehab team continue to assess barriers to discharge/monitor patient progress toward functional and medical goals. FIM: FIM - Bathing Bathing Steps Patient Completed: Chest;Right Arm;Abdomen;Front perineal area;Right upper leg;Left upper leg Bathing: 1: Two helpers  FIM - Upper Body Dressing/Undressing Upper body dressing/undressing steps patient completed: Thread/unthread right sleeve of pullover shirt/dresss Upper body dressing/undressing: 1: Two helpers FIM - Lower Body Dressing/Undressing Lower body dressing/undressing: 1: Two helpers  FIM - Toileting Toileting: 0: Activity did not occur  FIM - Radio producer Devices:  (commode bench) Toilet Transfers: 0-Activity did not occur  FIM - Control and instrumentation engineer Devices: Sliding board;Arm rests Bed/Chair Transfer: 1: Supine > Sit: Total A (helper does all/Pt. < 25%);1: Two helpers;1: Sit > Supine: Total A (helper does all/Pt. < 25%)  FIM - Locomotion: Wheelchair Locomotion: Wheelchair: 0: Activity did not occur FIM - Locomotion: Ambulation Locomotion: Ambulation: 0: Activity did not occur  Comprehension Comprehension Mode: Auditory Comprehension: 4-Understands basic 75 - 89% of the time/requires cueing 10 - 24% of the time  Expression Expression Mode: Verbal Expression: 3-Expresses basic 50 - 74% of the time/requires  cueing 25 - 50% of  the time. Needs to repeat parts of sentences.  Social Interaction Social Interaction: 3-Interacts appropriately 50 - 74% of the time - May be physically or verbally inappropriate.  Problem Solving Problem Solving: 3-Solves basic 50 - 74% of the time/requires cueing 25 - 49% of the time  Memory Memory: 2-Recognizes or recalls 25 - 49% of the time/requires cueing 51 - 75% of the time   Medical Problem List and Plan:  1. Functional deficits secondary to [place], embolic infarcts in a patient with post-polio syndrome  2. DVT Prophylaxis/Anticoagulation: Pharmaceutical: Lovenox  3. Pain Management: Used naprosyn bid at home. May need to schedule tylenol--monitor for now.  4. Mood: Provide ego support. LCSW to follow for evaluation and support.  5. Neuropsych: This patient is is not capable of making decisions on his own behalf. Defer financial decisions to POA 6. HTN: mainly systolic nimproving Will monitor BP every 8 hours and adjust medication as needed for tighter control. Continue lisinopril and d/c HCTZ due to elevated BUN, IVF qhs, repeat BMET normal, increase amlodipine  8. 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 Routine pressure relief measures-- will order air mattress overlay.  9.  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  Left achilles tendinitis tx with PRAFO and voltaren gel, on medrol for suspected gout flare, urate minimally elevated , no gouty arthropathy noted,  ankle film shows OA, foot xray unremarkable No pain on exam left foot and ankle,  WBAT LOS (Days) 14 A FACE TO FACE EVALUATION WAS PERFORMED  Charlett Blake 09/11/2013, 7:09 AM

## 2013-09-11 NOTE — Progress Notes (Signed)
Check of lytes reveal improvement in renal status. Will d/c IVF and encourage fluid intake. Will need to monitor K+ levels on lisinopril. Recheck labs on Fri.

## 2013-09-11 NOTE — Progress Notes (Signed)
Physical Therapy Session Note  Patient Details  Name: [name] MRN: [id number] Date of Birth: 06/01/1950  Today's Date: 09/11/2013 Time: 2122-4825 Time Calculation (min): 57 min  Short Term Goals: Week 2:  PT Short Term Goal 1 (Week 2): Pt will perform bed mobility with mod A of one person PT Short Term Goal 2 (Week 2): Pt will perform bed <> w/c transfers with max A of one person PT Short Term Goal 3 (Week 2): Pt will perform dynamic sitting balance activities with mod A x 10 minutes PT Short Term Goal 4 (Week 2): Pt will tolerated supported standing (standing frame or tilt table) for LE and core strengthening x 5 minutes with total A  Skilled Therapeutic Interventions/Progress Updates:   Pt received lying in bed this morning, agreeable to therapy.  Upon performing rolling to prepare to sit at EOB, note that condom cath had slipped off and pt incontinent of urine in brief/bed, therefore assisted with several reps of rolling to don dry clothes and don new brief.  Nurse tech in room to assist with donning catheter.  Pt able to roll to the R with min assist, however requires more mod assist to the L with continued max verbal and tactile cues for keeping head flexed, as he continues to demonstrate extension pattern.  Assisted to EOB where pt was able to maintain static sitting balance with min/guard to close S, however with loss of stability with LE's, requires min assist (to don PRAFO's).  Also assisted with donning shirt.  Held arm hole as pt threaded BUEs into shirt.  Provided hand over hand assist for pt to pull shirt over head, however he was unable to complete task.  Performed slideboard transfer bed>w/c with +2 assist (total assist with second person stabilizing slideboard).  Pt able to assist approx 20%, however still has increased difficulty with forward trunk lean, trunk dissociation, and also how to use UEs properly to self assist.  Once in chair, assisted down to therapy gym via w/c at  total assist level.  Remainder of session focused on standing tolerance, posture, and decreased UE support in standing while in standing frame.  Tolerated approx 15 mins in standing frame while working on posture, upright head/chest, and also working on decreased UE support with reaching activity.  Pt requires max assist to completely elevate L elbow from table due to fear of falling forward.  Pt tolerated well with no pain in L ankle.  Pt assisted back into w/c and back to room.  Quick release belt donned and all needs in reach.    Interpretor present during session to translate as needed.   Therapy Documentation Precautions:  Precautions Precautions: Fall Precaution Comments: Limited WB on LLE due to possible gout Restrictions Weight Bearing Restrictions: Yes LLE Weight Bearing: Partial weight bearing Vital Signs: Therapy Vitals Pulse Rate: 51 BP: 151/82 mmHg Pain: min c/o pain in L ankle during standing activity.   See FIM for current functional status  Therapy/Group: Individual Therapy and Co-Treatment (cotx w/ RT for 30 mins)  Humna [place] A Endora Teresi 09/11/2013, 10:01 AM

## 2013-09-12 ENCOUNTER — Inpatient Hospital Stay (HOSPITAL_COMMUNITY): Payer: Medicare Other | Admitting: Speech Pathology

## 2013-09-12 ENCOUNTER — Encounter (HOSPITAL_COMMUNITY): Payer: Medicare Other | Admitting: Occupational Therapy

## 2013-09-12 ENCOUNTER — Inpatient Hospital Stay (HOSPITAL_COMMUNITY): Payer: Medicare Other | Admitting: Rehabilitation

## 2013-09-12 NOTE — Progress Notes (Signed)
Occupational Therapy Session Note  Patient Details  Name: [name] MRN: [id number] Date of Birth: 1951-01-30  Today's Date: 09/12/2013 Time: 0900-0957 Time Calculation (min): 57 min  Short Term Goals: Week 2:  OT Short Term Goal 1 (Week 2): Pt will maintain static sitting EOB with min assist for 3 mins in preparation for selfcare tasks. OT Short Term Goal 2 (Week 2): Pt will self feed using the LUE primarily with AE and supervision. OT Short Term Goal 3 (Week 2): Pt will perform 3 grooming tasks in supported sitting at the sink with supervision. OT Short Term Goal 4 (Week 2): Pt will perform toilet transfers to drop arm commode vs commode bench with total assist using sliding board.  OT Short Term Goal 5 (Week 2): Pt will roll side to side in bed to assist with peri hygiene with min assist to both the left and right.   Skilled Therapeutic Interventions/Progress Updates:    Pt performed bathing and dressing supine with HOB elevated to supine rolling.  Pt's friend present during session and able to observe techniques for bed level selfcare.  Pt needed mod assist for UB bathing with HOB including facilitation of both the RUE and LUE.  He needed max assist for washing his lower legs and feet while therapist helped to bring them up and cross them over each other.  Mod assist for rolling side to side in supine to wash peri area and donn brief and pants over hips.  He transitioned supine to sit EOB with total assist and then to the wheelchair with sliding board with total assist.  Positioned at the sink and propped his arms on the sink for brushing his teeth with min assist.  Feel caregiver will need more hands on if pt is going to discharge home.    Therapy Documentation Precautions:  Precautions Precautions: Fall Precaution Comments: Limited WB on LLE due to possible gout Restrictions Weight Bearing Restrictions: Yes LLE Weight Bearing: Partial weight bearing  Pain: Pain Assessment Pain  Assessment: Faces Faces Pain Scale: Hurts a little bit Pain Type: Acute pain Pain Location: Ankle Pain Orientation: Right;Left Pain Intervention(s): Repositioned ADL: See FIM for current functional status  Therapy/Group: Individual Therapy  Cindra Presume OTR/L 09/12/2013, 11:31 AM

## 2013-09-12 NOTE — Progress Notes (Signed)
Speech Language Pathology Daily Session Note  Patient Details  Name: [name] MRN: [id number] Date of Birth: 08/05/1950  Today's Date: 09/12/2013 Time: 1005-1030 Time Calculation (min): 25 min  Short Term Goals: Week 2: SLP Short Term Goal 1 (Week 2): Patient will utilize external aids for orientation with Mod assist  SLP Short Term Goal 2 (Week 2): Patient will express phrase lenght utterances with Min sentence completion and question cues  Skilled Therapeutic Interventions: Skilled treatment session focused on addressing caregiver education with friend.  Patient seen in room with friend and interpreter present.  SLP educated on need to allow increased wait time as well as choice of three to assist with retrieval of information.  Friend began loudly saying name of state at patient multiple times resulting in patient crying.  Friend required Max re-direction cues from SLP to utilize previously taught strategies.  With SLP cuing patient he verbalized orientation x4 with increased wait time and choice of three x1 for state. Friend frequently talked over SLP and interpreter.  Patient ordered lunch with choice of two-three with increased wait time.  Friend unable to return demonstration of taught strategies; recommend other discharge option.  Continue with current plan of care.     FIM:  Comprehension Comprehension Mode: Auditory Comprehension: 4-Understands basic 75 - 89% of the time/requires cueing 10 - 24% of the time Expression Expression Mode: Verbal Expression: 3-Expresses basic 50 - 74% of the time/requires cueing 25 - 50% of the time. Needs to repeat parts of sentences. Social Interaction Social Interaction: 3-Interacts appropriately 50 - 74% of the time - May be physically or verbally inappropriate. Problem Solving Problem Solving: 3-Solves basic 50 - 74% of the time/requires cueing 25 - 49% of the time Memory Memory: 2-Recognizes or recalls 25 - 49% of the time/requires cueing 51 -  75% of the time  Pain Pain Assessment Pain Assessment: No/denies pain  Therapy/Group: Individual Therapy  [name]., [place]  [name] 09/12/2013, 10:48 AM

## 2013-09-12 NOTE — Progress Notes (Signed)
Speech Language Pathology Weekly Progress and Session Note  Patient Details  Name: [name] MRN: [id number] Date of Birth: 09-23-1950  Beginning of progress report period: Sep 05, 2013 End of progress report period: September 13, 2013  Today's Date: 09/13/2013 Time: 1007-1030 Time Calculation (min): 23 min  Short Term Goals: Week 2: SLP Short Term Goal 1 (Week 2): Patient will utilize external aids for orientation with Mod assist  SLP Short Term Goal 1 - Progress (Week 2): Met SLP Short Term Goal 2 (Week 2): Patient will express phrase lenght utterances with Min sentence completion and question cues SLP Short Term Goal 2 - Progress (Week 2): Progressing toward goal    New Short Term Goals: Week 3: SLP Short Term Goal 1 (Week 3): Patient will utilize external aids for orientation with Min assist  SLP Short Term Goal 2 (Week 3): Patient will express phrase lenght utterances with Min sentence completion and question cues SLP Short Term Goal 3 (Week 3): Patient will recall and verbally describe 3 sequence steps to start a transfer with Mod assist   Weekly Progress Updates: Patient has made functional gains and has met 1 of 2 short term goals this reporting period due to improved ability to recall and verbalize orientation with choice of three for place and situation.  Use of calendar and clock will be introduced in this coming reporting period as well as recall of specific information to assist with transfers.  Patient's ability to verbally express spontaneous needs and wants has fewer instances of anomia; however, during moments of demands when expression break downs occur patient is unable to utilize strategies to assist himself.  Currently, patient continues to require Min-Mod assist for orientation and structured naming tasks.  Patient and family education ongoing. Patient would benefit from continued skilled SLP intervention to maximize orientation, recall of new information and ability to  express self consistently in order to maximize his functional independence prior to discharge with 24/7 assist.   Intensity: Minumum of 1-2 x/day, 30 to 90 minutes Frequency: 5 out of 7 days Duration/Length of Stay: TBD pending SNF placement  Treatment/Interventions: Cueing hierarchy;Cognitive remediation/compensation;Environmental controls;Functional tasks;Internal/external aids;Patient/family education;Speech/Language facilitation;Therapeutic Activities   Daily Session  Skilled Therapeutic Interventions: Skilled treatment session focused on addressing cognitive-linguistic goals.  Patient seen in room with interpreter present.  Patient ordered lunch with choice of two-three items and 2-3 repeats due to decreased working memory.  Patient also required Max cues to recall 3 steps needed to initiate a sit to stand sequence.  Patient initiated requests for repeats with Mod question cues and verbally produced 3-4 word utterances.  Continue with current plan of care.      FIM:  Comprehension Comprehension Mode: Auditory Comprehension: 4-Understands basic 75 - 89% of the time/requires cueing 10 - 24% of the time Expression Expression Mode: Verbal Expression: 3-Expresses basic 50 - 74% of the time/requires cueing 25 - 50% of the time. Needs to repeat parts of sentences. Social Interaction Social Interaction: 4-Interacts appropriately 75 - 89% of the time - Needs redirection for appropriate language or to initiate interaction. Problem Solving Problem Solving: 3-Solves basic 50 - 74% of the time/requires cueing 25 - 49% of the time Memory Memory: 2-Recognizes or recalls 25 - 49% of the time/requires cueing 51 - 75% of the time General  Amount of Missed SLP Time (min): 7 Minutes Pain Pain Assessment Pain Assessment: No/denies pain Pain Score: 0-No pain  Therapy/Group: Individual Therapy  Gunnar Fusi, M.A., CCC-SLP [place]  Jackqulyn Livings 09/13/2013, 4:53 PM

## 2013-09-12 NOTE — Progress Notes (Signed)
Recreational Therapy Session Note  Patient Details  Name: Cleatus Gabriel MRN: [id number] Date of Birth: 25-Jul-1950 Today's Date: 09/12/2013  Pain: c/o LLE pain, relieved with position changes Skilled Therapeutic Interventions/Progress Updates: Session focused on activity tolerance, slide board transfers & dynamic sitting balance seated EOM.  Pt performed slide board transfers with Total assist +2.  Attempted standing in standing frame & pt unable to tolerate due to L foot pain.  Pt sat EOM for reaching activity with mod-max assist for balance.  Therapy/Group: Co-Treatment   Waldon Reining 09/12/2013, 4:25 PM

## 2013-09-12 NOTE — Progress Notes (Signed)
Occupational Therapy Weekly Progress Note  Patient Details  Name: [name] MRN: [id number] Date of Birth: 11-04-1950  Beginning of progress report period: Sep 05, 2013 End of progress report period: September 12, 2013  Today's Date: 09/12/2013  Patient has met 2 of 5 short term goals.  He continues to need mod to max assist for UB selfcare in supported sitting as well as max to total assist for LB selfcare supine to sitting EOB.  Pt continues to demonstrate decreased RUE functional use which appears to be pre-morbid as well as LUE weakness, which is functioning at a non-dominant level.  He demonstrates the most weakness in the shoulder, usually requiring mod facilitation for reaching tasks or propping elbows on the sink during grooming activities.  Mr. Bala left ankle pain has improved some but he is still unable to accurately perform sit to stand or maintain standing for functional selfcare or transfers.  Pt continues to need total assist +2 (pt 20-30%) for transfers via sliding board.  In addition, he continues to need max demonstrational cueing to perform adequate forward trunk flexion during the transfers while attempting to use his UEs to help him scoot across the board.  Overall he had made only minimal gains with OT at this time.  Anticipate progress to remain slow with pt needing max assist at discharge if going home.  Still unsure at this point if pt will have consistent assist but feel he will likely require use of a hoyer and hospital bed if home as he is not safe to transfer with family at this time.  Will continue to follow for progression to OT goals.   Patient continues to demonstrate the following deficits: decreased balance, decreased UE functional and strength bilaterally, decreased LE strength, decreased problem solving and therefore will continue to benefit from skilled OT intervention to enhance overall performance with BADL.  Patient not progressing toward long term goals.  See goal  revision..   OT Short Term Goals Week 2:  OT Short Term Goal 1 (Week 2): Pt will maintain static sitting EOB with min assist for 3 mins in preparation for selfcare tasks. OT Short Term Goal 1 - Progress (Week 2): Met OT Short Term Goal 2 (Week 2): Pt will self feed using the LUE primarily with AE and supervision. OT Short Term Goal 2 - Progress (Week 2): Not met OT Short Term Goal 3 (Week 2): Pt will perform 3 grooming tasks in supported sitting at the sink with supervision. OT Short Term Goal 3 - Progress (Week 2): Met OT Short Term Goal 4 (Week 2): Pt will perform toilet transfers to drop arm commode vs commode bench with total assist using sliding board.  OT Short Term Goal 4 - Progress (Week 2): Not met OT Short Term Goal 5 (Week 2): Pt will roll side to side in bed to assist with peri hygiene with min assist to both the left and right.  OT Short Term Goal 5 - Progress (Week 2): Not met  Therapy Documentation Precautions:  Precautions Precautions: Fall Precaution Comments: Limited WB on LLE due to possible gout Restrictions Weight Bearing Restrictions: Yes LLE Weight Bearing: Partial weight bearing  Vital Signs: Therapy Vitals Temp: 98.3 F (36.8 C) Temp src: Oral Pulse Rate: 84 Resp: 17 BP: 130/78 mmHg Patient Position (if appropriate): Lying Oxygen Therapy SpO2: 97 % O2 Device: None (Room air)  See FIM for current functional status  Therapy/Group: Individual Therapy  Cindra Presume OTR/L 09/12/2013, 4:36 PM

## 2013-09-12 NOTE — Progress Notes (Signed)
Physical Therapy Session Note  Patient Details  Name: [name] MRN: [id number] Date of Birth: [birth year]  Today's Date: 09/12/2013 Time: 9381-8299 Time Calculation (min): 45 min  Short Term Goals: Week 2:  PT Short Term Goal 1 (Week 2): Pt will perform bed mobility with mod A of one person PT Short Term Goal 2 (Week 2): Pt will perform bed <> w/c transfers with max A of one person PT Short Term Goal 3 (Week 2): Pt will perform dynamic sitting balance activities with mod A x 10 minutes PT Short Term Goal 4 (Week 2): Pt will tolerated supported standing (standing frame or tilt table) for LE and core strengthening x 5 minutes with total A  Skilled Therapeutic Interventions/Progress Updates:   Pt received sitting in w/c in room with interpretor present to assist with translation and also with "friend" present during session to observe and continue to D/C planning.  Discussed with "friend" the amount of care pt currently requiring, need for w/c, hospital bed, and use of mechanical hoyer lift if pt were to D/c home.  Assisted pt to/from gym via w/c at total assist level.  Focus of therapy session was functional transfers with use of slideboard and also scooting side/side on mat to work on forward trunk lean, hand placement and trunk activation for scooting to translate to easier transfers.  Performed slideboard transfer w/c <> mat in therapy gym with total assist (pt assist 20-30%, does better when going towards the L, as he can use LUE to pull himself into w/c).  Continue to provide max verbal, demonstration and manual cues for increased forward weight shift, forward trunk lean and WB through LEs when scooting for ease of transfer.  Once on EOM, worked on lateral scooting to translate over to transfers.  Pt able to activate trunk mm with increased anterior pelvic tilt and scapular retraction, however when asked to "lean forward" he tends to lose posture and demonsrtates increased forward flexed posture.   Pt requires max assist for scooting, however did note mild improvement during session, however did not actively translate to transfer back to w/c.  Pt assisted back to w/c as stated above and back to room.  Quick release belt in place and all needs in reach.   FYI:  Note that during session, "friend" attempting to pull on pt at times, providing over stimulating cues, not following verbal cues from PT to allow increased time for pt to problem solve and feel that at this time, "friend" is unsafe to provide adequate care of pt.  Continue to recommend follow up at SNF for increased safety and to decrease burden of care.  CSW aware of recommendations.   Therapy Documentation Precautions:  Precautions Precautions: Fall Precaution Comments: Limited WB on LLE due to possible gout Restrictions Weight Bearing Restrictions: Yes LLE Weight Bearing: Partial weight bearing   Pain: Pain Assessment Pain Assessment: Faces Faces Pain Scale: Hurts a little bit Pain Type: Acute pain Pain Location: Ankle Pain Orientation: Right;Left Pain Intervention(s): Repositioned     See FIM for current functional status  Therapy/Group: Individual Therapy  Raquel Sarna A Shanai Lartigue 09/12/2013, 12:17 PM

## 2013-09-12 NOTE — Progress Notes (Signed)
Social Work Patient ID: [name], male   DOB: 07-23-1950, 63 y.o.   MRN: [id number]  CSW met with pt, [name] (dtr), and pt's friend who was here for family training with the interpreter to update them on team conference discussion.  Therapists felt that pt would not be safe to go home with the friend who was here for training.  CSW spoke with everyone about the idea of pt going to SNF first and getting stronger prior to going home.  Pt agrees with this idea as was dtr and they both would like for CSW to pursue SNF placement.  CSW left pt's ex-wife a message via the interpreter out of courtesy to let her know of plan, however, pt stated that he has signed divorce papers and that they are legally divorced.  Therefore, pt with the assistance of his dtr will be making d/c decisions.  CSW has faxed FL2 to facilities in [place] area.  CSW will continue to assist pt with d/c plan.  Support offered to pt as he was tearful.

## 2013-09-12 NOTE — Progress Notes (Signed)
Physical Therapy Session Note  Patient Details  Name: [name] MRN: [id number] Date of Birth: 07-11-1950  Today's Date: 09/12/2013 Time: 1500-1556 Time Calculation (min): 56 min  Short Term Goals: Week 2:  PT Short Term Goal 1 (Week 2): Pt will perform bed mobility with mod A of one person PT Short Term Goal 2 (Week 2): Pt will perform bed <> w/c transfers with max A of one person PT Short Term Goal 3 (Week 2): Pt will perform dynamic sitting balance activities with mod A x 10 minutes PT Short Term Goal 4 (Week 2): Pt will tolerated supported standing (standing frame or tilt table) for LE and core strengthening x 5 minutes with total A  Skilled Therapeutic Interventions/Progress Updates:   Pt received lying in bed this afternoon, initially declining therapy, however with verbal encouragement from therapist agreed to get OOB.  Performed bed mobility with total assist with less initiation of moving LEs out of bed today vs previous sessions.  Provided manual facilitation for increased trunk/head flexion and rotation during SL to sit, as he continues to demonstrate extension pattern due to fear of falling forward.  Performed slideboard transfer bed>w/c<> therapy mat with +2 assist (for safety and stabilization of slidebaord), however pt noted to demonstrate increased activation of scooting today vs this morning (pt assist 30-40%).  Continue to provide manual facilitation for forward weight shift with therapist blocking LEs to prevent forward translation off of board.  Once in therapy gym, attempted use of standing frame, however due to increased pain in L foot, unable to tolerate, therefore transferred to therapy mat to work on dynamic sitting balance with reaching activity.  Placed physioball in holder anterior to pt with cone in order to work on retrieving and placing rings on cones for decreased UE support when attempting to push off vs using back ext mm.  Provided mod/max assist for forward trunk  lean, however note pt doing VERY well with initiating upright posture with anterior pelvic tilt, shoulder retraction and upright chest prior to any forward reaching.  He does require assist for maintaining head forward, again as he tends to compensate forward lean with cervical extension.  Pt did very well with activity and seemed to be in better "mood" this afternoon.  Pt assisted back to w/c as stated above and back to w/c.  All needs in reach and quick release belt donned.     Therapy Documentation Precautions:  Precautions Precautions: Fall Precaution Comments: Limited WB on LLE due to possible gout Restrictions Weight Bearing Restrictions: Yes LLE Weight Bearing: Partial weight bearing   Vital Signs: Therapy Vitals Temp: 98.3 F (36.8 C) Temp src: Oral Pulse Rate: 84 Resp: 17 BP: 130/78 mmHg Patient Position (if appropriate): Lying Oxygen Therapy SpO2: 97 % O2 Device: None (Room air) Pain: pt with pain in L ankle (with noted swelling), therefore unable to perform standing activities.    See FIM for current functional status  Therapy/Group: Individual Therapy (co-treat with RT)  Raquel Sarna A Freddie Nghiem 09/12/2013, 4:09 PM

## 2013-09-12 NOTE — Progress Notes (Signed)
Subjective/Complaints: 63 y.o. 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 CT head with acute Intraparenchymal hemorrhage right basal ganglia. He had increase in lethargy on 05/14 and follow up CCT with additional multifocal ischemic infarcts involving bilateral parietal and temporal lobes as well as right occipital lobe.. Patient with resultant paraparesis as well as LUE weakness, apraxia, decreased verbal output with naming errors as well as difficulty with multistep commands  50-100% meals, ~765m fluid per day Smiling, limited English plus severe cog defs  interpreter this am, friend here who states he will help pt at home Review of Systems - limited, poor awareness of def Objective: Vital Signs: Blood pressure 162/78, pulse 57, temperature 98.1 F (36.7 C), temperature source Oral, resp. rate 17, height 5' (1.524 m), weight 58.6 kg (129 lb 3 oz), SpO2 99.00%. No results found. Results for orders placed during the hospital encounter of 08/28/13 (from the past 72 hour(s))  BASIC METABOLIC PANEL     Status: Abnormal   Collection Time    09/11/13 11:15 AM      Result Value Ref Range   Sodium 140  137 - 147 mEq/L   Potassium 4.2  3.7 - 5.3 mEq/L   Chloride 101  96 - 112 mEq/L   CO2 25  19 - 32 mEq/L   Glucose, Bld 131 (*) 70 - 99 mg/dL   BUN 34 (*) 6 - 23 mg/dL   Creatinine, Ser 0.76  0.50 - 1.35 mg/dL   Calcium 9.9  8.4 - 10.5 mg/dL   GFR calc non Af Amer >90  >90 mL/min   GFR calc Af Amer >90  >90 mL/min   Comment: (NOTE)     The eGFR has been calculated using the CKD EPI equation.     This calculation has not been validated in all clinical situations.     eGFR's persistently <90 mL/min signify possible  Chronic Kidney     Disease.      General: No acute distress Mood and affect are appropriate Heart: Regular rate and rhythm no rubs murmurs or extra sounds Lungs: Clear to auscultation, breathing unlabored, no rales or wheezes Abdomen: Positive bowel sounds, soft nontender to palpation, nondistended Extremities: no edema, atrophy in RUE with fasiculations in EDC, BLE atrophy in thigh , leg and feet Skin: No evidence of breakdown, no evidence of rash Neurologic: Cranial nerves II through XII intact, motor strength is 0/5 in RUE 4/5 in left deltoid, bicep, tricep, grip, 3-/5 bilateral hip flexor,0/5 in Bilateral  knee extensors, ankle dorsiflexor and plantar flexor, increased tone LUE, decreased tone RUE and BLE Sensory exam limited by language  Musculoskeletal: Full passive range of motion in all 4 extremities.+pain to palp Left Achilles or left foot, + pain with passive ankle DF  Frog leg posture  No joint swelling, [facility]  Assessment/Plan: 1. 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 Physiatrist is providing close team supervision and 24 hour management of active medical problems listed below. Physiatrist and rehab team continue to assess barriers to discharge/monitor  patient progress toward functional and medical goals. Team conference today please see physician documentation under team conference tab, met with team face-to-face to discuss problems,progress, and goals. Formulized individual treatment plan based on medical history, underlying problem and comorbidities. FIM: FIM - Bathing Bathing Steps Patient Completed: Chest;Right Arm;Abdomen;Front perineal area;Right upper leg;Left upper leg Bathing: 1: Two helpers  FIM - Upper Body Dressing/Undressing Upper body  dressing/undressing steps patient completed: Thread/unthread right sleeve of pullover shirt/dresss Upper body dressing/undressing: 1: Two helpers FIM - Lower Body Dressing/Undressing Lower body dressing/undressing: 1: Two helpers  FIM - Toileting Toileting: 1: Total-Patient completed zero steps, helper did all 3  FIM - Radio producer Devices:  (commode bench) Toilet Transfers: 0-Activity did not occur  FIM - Control and instrumentation engineer Devices: Sliding board Bed/Chair Transfer: 1: Chair or W/C > Bed: Total A (helper does all/Pt. < 25%)  FIM - Locomotion: Wheelchair Locomotion: Wheelchair: 0: Activity did not occur FIM - Locomotion: Ambulation Locomotion: Ambulation: 0: Activity did not occur  Comprehension Comprehension Mode: Auditory Comprehension: 4-Understands basic 75 - 89% of the time/requires cueing 10 - 24% of the time  Expression Expression Mode: Verbal Expression: 3-Expresses basic 50 - 74% of the time/requires cueing 25 - 50% of the time. Needs to repeat parts of sentences.  Social Interaction Social Interaction: 3-Interacts appropriately 50 - 74% of the time - May be physically or verbally inappropriate.  Problem Solving Problem Solving: 3-Solves basic 50 - 74% of the time/requires cueing 25 - 49% of the time  Memory Memory: 2-Recognizes or recalls 25 - 49% of the time/requires cueing 51 - 75% of the time   Medical Problem List and Plan:  1. Functional deficits secondary to [place], embolic infarcts in a patient with post-polio syndrome  2. DVT Prophylaxis/Anticoagulation: Pharmaceutical: Lovenox  3. Pain Management: Used naprosyn bid at home. May need to schedule tylenol--monitor for now.  4. Mood: Provide ego support. LCSW to follow for evaluation and support.  5. Neuropsych: This patient is is not capable of making decisions on his own behalf. Defer financial decisions to POA 6. HTN: mainly systolic nimproving  Will monitor BP every 8 hours and adjust medication as needed for tighter control. Continue lisinopril and d/c HCTZ due to elevated BUN, IVF qhs, repeat BMET normal, increase amlodipine  8. 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 Routine pressure relief measures-- will order air mattress overlay.  9.  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  Left achilles tendinitis tx with PRAFO and voltaren gel, on medrol for suspected gout flare, urate minimally elevated , no gouty arthropathy noted,  ankle film shows OA, foot xray unremarkable No pain on exam left foot and ankle,  WBAT LOS (Days) 15 A FACE TO FACE EVALUATION WAS PERFORMED  Charlett Blake 09/12/2013, 8:54 AM

## 2013-09-13 ENCOUNTER — Encounter (HOSPITAL_COMMUNITY): Payer: Medicare Other | Admitting: Occupational Therapy

## 2013-09-13 ENCOUNTER — Inpatient Hospital Stay (HOSPITAL_COMMUNITY): Payer: Medicare Other | Admitting: Speech Pathology

## 2013-09-13 ENCOUNTER — Inpatient Hospital Stay (HOSPITAL_COMMUNITY): Payer: Medicare Other | Admitting: Rehabilitation

## 2013-09-13 DIAGNOSIS — I619 Nontraumatic intracerebral hemorrhage, unspecified: Secondary | ICD-10-CM

## 2013-09-13 DIAGNOSIS — B91 Sequelae of poliomyelitis: Secondary | ICD-10-CM

## 2013-09-13 DIAGNOSIS — I6340 Cerebral infarction due to embolism of unspecified cerebral artery: Secondary | ICD-10-CM

## 2013-09-13 MED ORDER — LISINOPRIL 40 MG PO TABS: 40.0000 mg | ORAL_TABLET | Freq: Every day | ORAL | Status: DC

## 2013-09-13 MED ORDER — MIRTAZAPINE 7.5 MG PO TABS: 7.5000 mg | ORAL_TABLET | Freq: Every day | ORAL | Status: DC

## 2013-09-13 MED ORDER — ALUM & MAG HYDROXIDE-SIMETH 200-200-20 MG/5ML PO SUSP: 30.0000 mL | ORAL | Status: DC | PRN

## 2013-09-13 MED ORDER — ENSURE COMPLETE PO LIQD: 237.0000 mL | Freq: Two times a day (BID) | ORAL | Status: DC

## 2013-09-13 MED ORDER — ACETAMINOPHEN 325 MG PO TABS: 325.0000 mg | ORAL_TABLET | ORAL | Status: DC | PRN

## 2013-09-13 MED ORDER — BENEPROTEIN PO POWD: 1.0000 | Freq: Three times a day (TID) | ORAL | Status: DC

## 2013-09-13 MED ORDER — PANTOPRAZOLE SODIUM 40 MG PO TBEC: 40.0000 mg | DELAYED_RELEASE_TABLET | Freq: Every day | ORAL | Status: DC

## 2013-09-13 MED ORDER — TRAMADOL HCL 50 MG PO TABS: 50.0000 mg | ORAL_TABLET | Freq: Four times a day (QID) | ORAL | Status: DC | PRN

## 2013-09-13 MED ORDER — DICLOFENAC SODIUM 1 % TD GEL: 2.0000 g | Freq: Four times a day (QID) | TRANSDERMAL | Status: DC

## 2013-09-13 MED ORDER — AMLODIPINE BESYLATE 10 MG PO TABS: 10.0000 mg | ORAL_TABLET | Freq: Every day | ORAL | Status: DC

## 2013-09-13 MED ORDER — SENNOSIDES-DOCUSATE SODIUM 8.6-50 MG PO TABS: 1.0000 | ORAL_TABLET | Freq: Two times a day (BID) | ORAL | Status: DC

## 2013-09-13 NOTE — Plan of Care (Signed)
Problem: RH Bed to Chair Transfers Goal: LTG Patient will perform bed/chair transfers w/assist (PT) LTG: Patient will perform bed/chair transfers with assistance, with/without cues (PT).  Outcome: Not Met (add Reason) Pt continues to require total assist for transfers due to fear of forward weight shift/trunk lean.   Problem: RH Awareness Goal: LTG: Patient will demonstrate intellectual/emergent (PT) LTG: Patient will demonstrate intellectual/emergent/anticipatory awareness with assist during a mobility activity (PT)  Outcome: Not Met (add Reason) Pt continues to have decreased intellectual awareness of deficits during PT sessions.

## 2013-09-13 NOTE — Progress Notes (Signed)
Subjective/Complaints: 63 y.o. 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 CT head with acute Intraparenchymal hemorrhage right basal ganglia. He had increase in lethargy on 05/14 and follow up CCT with additional multifocal ischemic infarcts involving bilateral parietal and temporal lobes as well as right occipital lobe.. Patient with resultant paraparesis as well as LUE weakness, apraxia, decreased verbal output with naming errors as well as difficulty with multistep commands  25-100% meals, ~890m fluid per day Smiling, limited English plus severe cog defs  interpreter this am, friend here who states he will help pt at home Review of Systems - limited, poor awareness of def Objective: Vital Signs: Blood pressure 162/80, pulse 71, temperature 99.2 F (37.3 C), temperature source Oral, resp. rate 18, height 5' (1.524 m), weight 58.6 kg (129 lb 3 oz), SpO2 98.00%. No results found. Results for orders placed during the hospital encounter of 08/28/13 (from the past 72 hour(s))  BASIC METABOLIC PANEL     Status: Abnormal   Collection Time    09/11/13 11:15 AM      Result Value Ref Range   Sodium 140  137 - 147 mEq/L   Potassium 4.2  3.7 - 5.3 mEq/L   Chloride 101  96 - 112 mEq/L   CO2 25  19 - 32 mEq/L   Glucose, Bld 131 (*) 70 - 99 mg/dL   BUN 34 (*) 6 - 23 mg/dL   Creatinine, Ser 0.76  0.50 - 1.35 mg/dL   Calcium 9.9  8.4 - 10.5 mg/dL   GFR calc non Af Amer >90  >90 mL/min   GFR calc Af Amer >90  >90 mL/min   Comment: (NOTE)     The eGFR has been calculated using the CKD EPI equation.     This calculation has not been validated in all clinical situations.     eGFR's persistently <90 mL/min signify possible  Chronic Kidney     Disease.      General: No acute distress Mood and affect are appropriate Heart: Regular rate and rhythm no rubs murmurs or extra sounds Lungs: Clear to auscultation, breathing unlabored, no rales or wheezes Abdomen: Positive bowel sounds, soft nontender to palpation, nondistended Extremities: no edema, atrophy in RUE with fasiculations in EDC, BLE atrophy in thigh , leg and feet Skin: No evidence of breakdown, no evidence of rash Neurologic: Cranial nerves II through XII intact, motor strength is 0/5 in RUE 4/5 in left deltoid, bicep, tricep, grip, 3-/5 bilateral hip flexor,0/5 in Bilateral  knee extensors, ankle dorsiflexor and plantar flexor, increased tone LUE, decreased tone RUE and BLE Sensory exam limited by language  Musculoskeletal: Full passive range of motion in all 4 extremities.+pain to palp Left Achilles or left foot, + pain with passive ankle DF  Frog leg posture  No joint swelling, [facility]  Assessment/Plan: 1. 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 Physiatrist is providing close team supervision and 24 hour management of active medical problems listed below. Physiatrist and rehab team continue to assess barriers to discharge/monitor  patient progress toward functional and medical goals. No caregiver for pt at home given current level of function FIM: FIM - Bathing Bathing Steps Patient Completed: Chest;Right Arm;Abdomen;Front perineal area;Left upper leg Bathing: 3: Mod-Patient completes 5-7 60f10 parts or 50-74%  FIM - Upper Body Dressing/Undressing Upper body dressing/undressing steps patient completed: Thread/unthread right sleeve of pullover shirt/dresss Upper body dressing/undressing: 1: Total-Patient completed less than 25%  of tasks FIM - Lower Body Dressing/Undressing Lower body dressing/undressing: 1: Two helpers  FIM - Toileting Toileting: 1: Total-Patient completed zero steps, helper did all 3  FIM - TRadio producerDevices:  (commode bench) Toilet Transfers: 0-Activity did not occur  FIM - BControl and instrumentation engineerDevices: Sliding board;Arm rests Bed/Chair Transfer: 1: Chair or W/C > Bed: Total A (helper does all/Pt. < 25%);1: Bed > Chair or W/C: Total A (helper does all/Pt. < 25%);1: Supine > Sit: Total A (helper does all/Pt. < 25%)  FIM - Locomotion: Wheelchair Locomotion: Wheelchair: 0: Activity did not occur FIM - Locomotion: Ambulation Locomotion: Ambulation: 0: Activity did not occur  Comprehension Comprehension Mode: Auditory Comprehension: 4-Understands basic 75 - 89% of the time/requires cueing 10 - 24% of the time  Expression Expression Mode: Verbal Expression: 3-Expresses basic 50 - 74% of the time/requires cueing 25 - 50% of the time. Needs to repeat parts of sentences.  Social Interaction Social Interaction: 3-Interacts appropriately 50 - 74% of the time - May be physically or verbally inappropriate.  Problem Solving Problem Solving: 3-Solves basic 50 - 74% of the time/requires cueing 25 - 49% of the time  Memory Memory: 2-Recognizes or recalls 25 - 49% of the time/requires cueing 51 - 75% of the time   Medical Problem List and Plan:  1. Functional deficits secondary to [place] embolic infarcts in a patient with post-polio syndrome  2. DVT Prophylaxis/Anticoagulation: Pharmaceutical: Lovenox  3. Pain Management: Used naprosyn bid at home. May need to schedule tylenol--monitor for now.  4. Mood: Provide ego support. LCSW to follow for evaluation and support.  5. Neuropsych: This patient is is not capable of making decisions on his own behalf. Defer financial decisions to POA 6. HTN: mainly systolic nimproving Will monitor BP  every 8 hours and adjust medication as needed for tighter control. Continue lisinopril and d/c HCTZ due to elevated BUN, IVF qhs, repeat BMET normal, increase amlodipine  8. 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 Routine pressure relief measures-- will order air mattress overlay.  9.  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  Left ankle tendinitis/OA tx with PRAFO and voltaren gel,   ankle film shows OA, foot xray unremarkable   WBAT LOS (Days) 16 A FACE TO FACE EVALUATION WAS PERFORMED  ACharlett Blake6/07/2013, 6:53 AM

## 2013-09-13 NOTE — Progress Notes (Signed)
Social Work Patient ID: [name], male   DOB: Aug 03, 1950, 63 y.o.   MRN: [id number]  Pt has received a bed offer from [facility].  Pt's dtr, [name], visited SNF this afternoon to tour facility.  Her sister and brother will visit after work.  Dtr has accepted the bed at [facility] and bed is available for pt to tx tomorrow morning. CSW updated PA, Pam, and Dr. Letta Pate.  CSW will continue to follow pt and facilitate tx to [facility] tomorrow.

## 2013-09-13 NOTE — Patient Care Conference (Signed)
Inpatient RehabilitationTeam Conference and Plan of Care Update Date: 09/12/2013   Time: 11:15 AM    Patient Name: [name]      Medical Record Number: [id number]  Date of Birth: [birth year] Sex: Male         Room/Bed: 4W26C/4W26C-01 Payor Info: Payor: MEDICARE / Plan: MEDICARE PART A AND B / Product Type: *No Product type* /    Admitting Diagnosis: ICH, embolic infarcta and post polio syndrome  Admit Date/Time:  08/28/2013  6:05 PM Admission Comments: No comment available   Primary Diagnosis:  ICH (intracerebral hemorrhage) Principal Problem: ICH (intracerebral hemorrhage)  Patient Active Problem List   Diagnosis Date Noted  . HTN (hypertension) 08/29/2013  . 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 Acute embolic stroke 54/65/0354  . ICH (intracerebral hemorrhage) 08/22/2013  . Stroke 08/22/2013    Expected Discharge Date: Expected Discharge Date: 09/13/13  Team Members Present: Physician leading conference: Dr. Alysia Penna Social Worker Present: Alfonse Alpers, LCSW Nurse Present: Elliot Cousin, RN PT Present: Raylene Everts, PT;Emily Rinaldo Cloud, PT OT Present: Clyda Greener, OT SLP Present: Gunnar Fusi, SLP PPS Coordinator present : Daiva Nakayama, RN, CRRN;Becky Alwyn Ren, PT     Current Status/Progress Goal Weekly Team Focus  Medical   no progress in therapy  maintain med stability  d/c planning   Bowel/Bladder   incontinent of bowel and bladder  Pt will be able to  call for assistance  Encourage pt to call for toileting needs and monitor Q2hr   Swallow/Nutrition/ Hydration   regular and thin with full assist          ADL's   total assist for LB selfcare supine to sit and mod assist for UB bathing.  He needs max to total assist for UB dressing as well.  Total assist +2 (pt 20-30%) for sliding board transfers to and from the wheelchair.  Pt is currently incontinent so have not attempted transfer to commode bench.    mod assist level transfers, bathing max assist  for toileting and max assist for LB dressing. supervision for self feeding and for grooming tasks  selfccare re-training, balance re-training, neuromuscular re-education, pt/family educaton   Mobility   Pt currently requires min to max assist for rolling (increased difficulty to the L), total assist for SL to sit, total assist (+2 for safety) for slideboard transfers (pt assist 20-30%).  Continues to demonstrate increased fear with forward weight shift and decreased trunk control with dynamic sitting balance.  Note decreased pain in L ankle, therefore tolerated standing frame well yesterday.   min-mod A transfers; max A to stand and ambulate with therapy only  postural control, dynamic sitting balance, bed mobility, functional transfers, supported standing.    Communication   Mod assist   Min assist   increase initiation and use of word finding strategies; family education     Safety/Cognition/ Behavioral Observations  Min assist   Min assist   family education    Pain   Pain continues in Left foot, PRN tylenol and Ultram   Pain to be 2 or less on the faces scale  Assess pain Q shift and PRN   Skin   Currently no skin issues  Pt to remain free of skin breakdown and injury  Assess skin Q shift and PRN    Rehab Goals Patient on target to meet rehab goals: No Rehab Goals Revised: Many goals have been downgraded due to pt's slow progress. *See Care Plan and progress notes for long and short-term  goals.  Barriers to Discharge: caregiver cannot help pt    Possible Resolutions to Barriers:  change to SNF    Discharge Planning/Teaching Needs:  Pt will be total care.  Pt's wife is actually an ex-wife and she sent a family friend in for training and this friend is not safe to provide necessary care for pt.  Pt and dtr agree for pt to receive for therapy before attempting to go home.  CSW is now pursuing SNF for pt.  Family training with friend 09-12-13 was not successful.  Plan is now for pt to go  to a SNF.   Team Discussion:  Pt is requiring total assistance with all tasks at this time.  He will be a lot of care at home and therapy team and MD feel the identified caregiver who was here for family training on day of conference cannot provide the care pt needs and they are recommending SNF care.  Revisions to Treatment Plan:  Pt's therapies were changed to one therapy each per day and goals were downgraded.   Continued Need for Acute Rehabilitation Level of Care: The patient requires daily medical management by a physician with specialized training in physical medicine and rehabilitation for the following conditions: Daily direction of a multidisciplinary physical rehabilitation program to ensure safe treatment while eliciting the highest outcome that is of practical value to the patient.: Yes Daily analysis of laboratory values and/or radiology reports with any subsequent need for medication adjustment of medical intervention for : Neurological problems;Other  [name] 09/13/2013, 11:18 AM

## 2013-09-13 NOTE — Progress Notes (Signed)
Social Work Patient ID: [name], male   DOB: 1951/03/23, 63 y.o.   MRN: [id number]  [name] Darrell Kueker, LCSW Social Worker Signed  Patient Care Conference Service date: 09/13/2013 9:30 AM  Inpatient RehabilitationTeam Conference and Plan of Care Update Date: 09/12/2013   Time: 11:15 AM     Patient Name: [name]       Medical Record Number: [id number]   Date of Birth: [birth year] Sex: Male         Room/Bed: 4W26C/4W26C-01 Payor Info: Payor: MEDICARE / Plan: MEDICARE PART A AND B / Product Type: *No Product type* /   Admitting Diagnosis: ICH, embolic infarcta and post polio syndrome   Admit Date/Time:  08/28/2013  6:05 PM Admission Comments: No comment available   Primary Diagnosis:  ICH (intracerebral hemorrhage) Principal Problem: ICH (intracerebral hemorrhage)    Patient Active Problem List     Diagnosis  Date Noted   .  HTN (hypertension)  08/29/2013   .  Post-poliomyelitis muscular atrophy  08/29/2013   .  Acute embolic stroke  32/95/1884   .  ICH (intracerebral hemorrhage)  08/22/2013   .  Stroke  08/22/2013     Expected Discharge Date: Expected Discharge Date: 09/13/13  Team Members Present: Physician leading conference: Dr. Alysia Penna Social Worker Present: Darrell Alpers, LCSW Nurse Present: Darrell Cousin, RN PT Present: [name], PT;[name], PT OT Present: [name], OT SLP Present: [name], SLP PPS Coordinator present : Darrell Nakayama, RN, CRRN;[name], PT        Current Status/Progress  Goal  Weekly Team Focus   Medical     no progress in therapy  maintain med stability  d/c planning   Bowel/Bladder     incontinent of bowel and bladder  Pt will be able to  call for assistance  Encourage pt to call for toileting needs and monitor Q2hr   Swallow/Nutrition/ Hydration     regular and thin with full assist        ADL's     total assist for LB selfcare supine to sit and mod assist for UB bathing.  He needs max to total assist  for UB dressing as well.  Total assist +2 (pt 20-30%) for sliding board transfers to and from the wheelchair.  Pt is currently incontinent so have not attempted transfer to commode bench.    mod assist level transfers, bathing max assist for toileting and max assist for LB dressing. supervision for self feeding and for grooming tasks  selfccare re-training, balance re-training, neuromuscular re-education, pt/family educaton   Mobility     Pt currently requires min to max assist for rolling (increased difficulty to the L), total assist for SL to sit, total assist (+2 for safety) for slideboard transfers (pt assist 20-30%).  Continues to demonstrate increased fear with forward weight shift and decreased trunk control with dynamic sitting balance. Note decreased pain in L ankle, therefore tolerated standing frame well yesterday.   min-mod A transfers; max A to stand and ambulate with therapy only  postural control, dynamic sitting balance, bed mobility, functional transfers, supported standing.    Communication     Mod assist   Min assist   increase initiation and use of word finding strategies; family education     Safety/Cognition/ Behavioral Observations    Min assist   Min assist   family education    Pain     Pain continues in Left foot, PRN tylenol and Ultram   Pain  to be 2 or less on the faces scale  Assess pain Q shift and PRN   Skin     Currently no skin issues  Pt to remain free of skin breakdown and injury  Assess skin Q shift and PRN    Rehab Goals Patient on target to meet rehab goals: No Rehab Goals Revised: Many goals have been downgraded due to pt's slow progress. *See Care Plan and progress notes for long and short-term goals.    Barriers to Discharge:  caregiver cannot help pt      Possible Resolutions to Barriers:    change to SNF      Discharge Planning/Teaching Needs:    Pt will be total care.  Pt's wife is actually an ex-wife and she sent a family friend in for training  and this friend is not safe to provide necessary care for pt.  Pt and dtr agree for pt to receive for therapy before attempting to go home.  CSW is now pursuing SNF for pt.   Family training with friend 09-12-13 was not successful.  Plan is now for pt to go to a SNF.    Team Discussion:    Pt is requiring total assistance with all tasks at this time.  He will be a lot of care at home and therapy team and MD feel the identified caregiver who was here for family training on day of conference cannot provide the care pt needs and they are recommending SNF care.   Revisions to Treatment Plan:    Pt's therapies were changed to one therapy each per day and goals were downgraded.    Continued Need for Acute Rehabilitation Level of Care: The patient requires daily medical management by a physician with specialized training in physical medicine and rehabilitation for the following conditions: Daily direction of a multidisciplinary physical rehabilitation program to ensure safe treatment while eliciting the highest outcome that is of practical value to the patient.: Yes Daily analysis of laboratory values and/or radiology reports with any subsequent need for medication adjustment of medical intervention for : Neurological problems;Other  [name] [name] 09/13/2013, 11:18 AM

## 2013-09-13 NOTE — Progress Notes (Signed)
Physical Therapy Discharge Summary  Patient Details  Name: [name] MRN: [id number] Date of Birth: 12/16/1950  Today's Date: 09/13/2013  Patient has met 3 of 5 long term goals due to improved activity tolerance, improved balance, improved postural control, ability to compensate for deficits and improved attention.  Patient to discharge at a wheelchair level Total Assist.   Pt continues to require high level of care, therefore will be D/Cing to SNF prior to return home, therefore did not complete formal family training.   Reasons goals not met: Pt continues to require total assist for transfers as he is extremely limited with postural control and increased fear of falling.  He also continues to be unaware of deficits at an intellectual level.   Recommendation:  Patient will benefit from ongoing skilled PT services in skilled nursing facility setting to continue to advance safe functional mobility, address ongoing impairments in decreased strength, decreased balance, decreased functional use of all extremities, decreased attention, decreased awareness, and minimize fall risk.  Equipment: No equipment provided  Reasons for discharge: discharge from hospital  Patient/family agrees with progress made and goals achieved: Yes  PT Discharge Precautions/Restrictions Precautions Precautions: Fall Precaution Comments: Limited WB on LLE due to possible gout Restrictions Weight Bearing Restrictions: No LLE Weight Bearing: Weight bearing as tolerated   Pain Pain Assessment Pain Assessment: No/denies pain    Cognition Overall Cognitive Status: Impaired/Different from baseline Arousal/Alertness: Awake/alert Orientation Level: Oriented X4 Attention: Sustained Focused Attention: Appears intact Sustained Attention: Impaired Sustained Attention Impairment: Functional complex Memory: Impaired Memory Impairment: Decreased recall of new information;Decreased short term memory Decreased Short  Term Memory: Verbal basic;Functional basic Awareness: Impaired Awareness Impairment: Intellectual impairment Problem Solving: Impaired Problem Solving Impairment: Functional basic Safety/Judgment: Impaired Sensation Sensation Light Touch: Appears Intact Stereognosis: Not tested Hot/Cold: Not tested Proprioception: Not tested Coordination Gross Motor Movements are Fluid and Coordinated: No Coordination and Movement Description: Pt with bilateral limited gross and FM coordination with the RUE being more impaired than the left.  He was able to hold the washcloth with the left UE but not the right.  Limited bilateral shoulder flexion for selfcare tasks.He is able to assist with transfers using LUE somewhat, however is impaired with all other limbs.  Motor  Motor Motor - Discharge Observations: Pt still with bilateral UE weakness with right side being premorbid and the left side being new per family.  Decreased bilateral LE functional strength and use as well as decreased dynamic and static trunk control for selfcare and mobility tasks.  Mobility Bed Mobility Bed Mobility: Rolling Right;Rolling Left;Supine to Sit Rolling Right: 3: Mod assist;With rail Rolling Right Details: Verbal cues for technique;Verbal cues for sequencing;Manual facilitation for weight shifting;Manual facilitation for placement Rolling Left: With rail;3: Mod assist Rolling Left Details: Verbal cues for technique;Manual facilitation for weight shifting;Manual facilitation for placement;Verbal cues for sequencing Supine to Sit: 2: Max assist Supine to Sit Details: Verbal cues for sequencing;Verbal cues for technique;Manual facilitation for weight shifting;Manual facilitation for placement Sit to Supine: 2: Max assist Transfers Transfers: Yes Sit to Stand: 1: +2 Total assist Lateral/Scoot Transfers: 1: +1 Total assist Lateral/Scoot Transfer Details: Verbal cues for technique;Verbal cues for precautions/safety;Manual  facilitation for weight shifting;Manual facilitation for placement;Manual facilitation for weight bearing;Tactile cues for initiation;Tactile cues for posture;Tactile cues for weight shifting;Tactile cues for weight beaing;Tactile cues for placement Locomotion  Ambulation Ambulation: No Stairs / Additional Locomotion Stairs: No Wheelchair Mobility Wheelchair Mobility: No  Trunk/Postural Assessment  Cervical Assessment Cervical Assessment: Within Functional  Limits Thoracic Assessment Thoracic Assessment: Exceptions to [facility] Thoracic Strength Overall Thoracic Strength Comments: Pt demonstrates slight thoracic kyphosis. Lumbar Assessment Lumbar Assessment: Exceptions to [facility] Lumbar Strength Overall Lumbar Strength Comments: Pt with decreased lumbar strength to maintain upright posture.  He falls into a posterior tilt.   Balance Balance Balance Assessed: Yes Static Sitting Balance Static Sitting - Balance Support: Right upper extremity supported;Left upper extremity supported;Feet supported Static Sitting - Level of Assistance: 3: Mod assist Static Sitting - Comment/# of Minutes: Pt's static sitting balance EOB variable from min to max assist. Dynamic Sitting Balance Dynamic Sitting - Balance Support: Right upper extremity supported Dynamic Sitting - Level of Assistance: 3: Mod assist Dynamic Sitting Balance - Compensations: Pt with decreased ability for full ROM during forward flexion, however during reaching activities, pt able to maintain balance with overall Mod A.  Dynamic Sitting - Balance Activities: Forward lean/weight shifting;Reaching for objects Extremity Assessment  RUE Assessment RUE Assessment: Exceptions to [facility] RUE Strength RUE Overall Strength Comments: right UE with history of Polio per chart.  PROM WFLs for all joints but AROM shoulder flexion absent.  Elbow flexion and extension 2-/5.  Pt maintains flexed digits at rest but can demonstrate active digit extension  slightly in the 4th and 5th digits but very minimal in the 2nd and 3rd as well as the thumb.   LUE Assessment LUE Assessment: Exceptions to [facility] LUE Strength LUE Overall Strength Comments: AAROM shoulder flexion WFLS, 2-/5 with AROM,  elbow flexion 4/5, elbow extension 3/5, grasp and release 4/5.  Decreased FM coordination with finger to thumb however.  Used functionally for selfcare tasks including brushing his teeth and washing his face.   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  In bed bilat LE rest in fully ER position  See FIM for current functional status  Raquel Sarna A Maezie Justin 09/13/2013, 7:30 PM

## 2013-09-13 NOTE — Plan of Care (Signed)
Problem: RH Balance Goal: LTG Patient will maintain dynamic standing balance (PT) LTG: Patient will maintain dynamic standing balance with assistance during mobility activities (PT)  Outcome: Not Applicable Date Met:  37/34/28 D/c all standing/ambulation goals, as pt will not be functional with these activities at time of D/C.   Problem: RH Bed Mobility Goal: LTG Patient will perform bed mobility with assist (PT) LTG: Patient will perform bed mobility with assistance, with/without cues (PT).  Downgraded goal due to lack of progress made in therapy and probable D/C to SNF.   Problem: RH Bed to Chair Transfers Goal: LTG Patient will perform bed/chair transfers w/assist (PT) LTG: Patient will perform bed/chair transfers with assistance, with/without cues (PT).  Downgraded due to lack of progress and probable change in D/C to SNF.   Problem: RH Car Transfers Goal: LTG Patient will perform car transfers with assist (PT) LTG: Patient will perform car transfers with assistance (PT).  Outcome: Not Applicable Date Met:  76/81/15 D/C car transfer goal at this time due to probable D/C to SNF, if home, will recommend ambulance transport.   Problem: RH Ambulation Goal: LTG Patient will ambulate in controlled environment (PT) LTG: Patient will ambulate in a controlled environment, # of feet with assistance (PT).  Outcome: Not Applicable Date Met:  72/62/03 D/C ambulation/standing goals due to pts lack of progress and will not be functional at time of D/C.

## 2013-09-13 NOTE — Plan of Care (Signed)
Problem: RH Balance Goal: LTG: Patient will maintain dynamic sitting balance (OT) LTG: Patient will maintain dynamic sitting balance with assistance during activities of daily living (OT)  Outcome: Not Met (add Reason) Max assist for dynamic sitting balance.  Problem: RH Eating Goal: LTG Patient will perform eating w/assist, cues/equip (OT) LTG: Patient will perform eating with assist, with/without cues using equipment (OT)  Outcome: Not Met (add Reason) Needs mod assist for self feeding.  Problem: RH Dressing Goal: LTG Patient will perform lower body dressing w/assist (OT) LTG: Patient will perform lower body dressing with assist, with/without cues in positioning using equipment (OT)  Outcome: Not Met (add Reason) Needs total assist

## 2013-09-13 NOTE — Progress Notes (Signed)
Occupational Therapy Discharge Summary  Patient Details  Name: [name] MRN: [id number] Date of Birth: 10-18-1950  Today's Date: 09/13/2013 Time: 1007-1030 Time Calculation (min): 23 min  Patient has met 7 of 10 long term goals due to improved balance and ability to compensate for deficits.  Patient to discharge at overall Total Assist level.  Pt is discharging to SNF at a max assist to total assist level as he does not have 24 hour supervision at discharge. Reasons goals not met: Pt continues to need mod assist for self feeding,  Total assist for LB dressing, Did not address tub/shower transfers  Recommendation:  Patient will benefit from ongoing skilled OT services in skilled nursing facility setting to continue to advance functional skills in the area of BADL.  Pt still exhibits severe balance deficits as well as functional mobility deficits related to selfcare tasks.  He currently requires max to total assist for basic bathing and dressing and cannot tolerate standing at this time without the use of equipment.  Pt with inconsistent information as well on PLOF so unsure where exactly he was functionally prior to these CVAs.  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    Equipment: No equipment provided  Reasons for discharge: discharge from hospital  Patient/family agrees with progress made and goals achieved: Yes  OT Discharge Precautions/Restrictions  Precautions Precautions: Fall Precaution Comments: Limited WB on LLE due to possible gout Restrictions Weight Bearing Restrictions: Yes LLE Weight Bearing: Partial weight bearing  Pain Pain Assessment Pain Assessment: No/denies pain Pain Score: 0-No pain ADL  See FIM scale for details. Vision/Perception  Vision- History Baseline Vision/History: No visual deficits Vision- Assessment Vision Assessment?: No apparent  visual deficits  Cognition Overall Cognitive Status: Impaired/Different from baseline Arousal/Alertness: Awake/alert Focused Attention: Appears intact Sustained Attention: Appears intact Memory: Impaired Memory Impairment: Decreased recall of new information;Decreased short term memory Decreased Short Term Memory: Verbal basic;Functional basic Awareness: Impaired Awareness Impairment: Intellectual impairment Problem Solving: Impaired Problem Solving Impairment: Functional basic Sensation Sensation Light Touch: Appears Intact Stereognosis: Not tested Hot/Cold: Not tested Proprioception: Not tested Coordination Gross Motor Movements are Fluid and Coordinated: No Coordination and Movement Description: Pt with bilateral limited gross and FM coordination with the RUE being more impaired than the left.  He was able to hold the washcloth with the left UE but not the right.  Limited bilateral shoulder flexion for selfcare tasks. Motor  Motor Motor - Discharge Observations: Pt still with bilateral UE weakness with right side being premorbid and the left side being new per family.  Decreased bilateral LE functional strength and use as well as decreased dynamic and static trunk control for selfcare tasks. Mobility  Bed Mobility Bed Mobility: Rolling Right;Rolling Left;Supine to Sit Rolling Right: 3: Mod assist;With rail Rolling Right Details: Verbal cues for technique;Verbal cues for sequencing;Manual facilitation for weight shifting;Manual facilitation for placement Rolling Left: With rail;3: Mod assist Rolling Left Details: Verbal cues for technique;Manual facilitation for weight shifting;Manual facilitation for placement;Verbal cues for sequencing Supine to Sit Details: Verbal cues for sequencing;Verbal cues for technique;Manual facilitation for weight shifting;Manual facilitation for placement  Trunk/Postural Assessment  Cervical Assessment Cervical Assessment: Within Functional  Limits Thoracic Assessment Thoracic Assessment: Exceptions to [facility] Thoracic Strength Overall Thoracic Strength Comments: Pt demonstrates slight thoracic kyphosis. Lumbar Assessment Lumbar Assessment: Exceptions to [facility] Lumbar Strength Overall Lumbar Strength Comments: Pt  with decreased lumbar strength to maintain upright posture.  He falls into a posterior tilt.   Balance Balance Balance Assessed: Yes Static Sitting Balance Static Sitting - Balance Support: Right upper extremity supported;Left upper extremity supported;Feet supported Static Sitting - Level of Assistance: 3: Mod assist Static Sitting - Comment/# of Minutes: Pt's static sitting balance EOB variable from min to max assist. Dynamic Sitting Balance Dynamic Sitting - Balance Support: Right upper extremity supported Dynamic Sitting - Level of Assistance: 1: +1 Total assist Dynamic Sitting Balance - Compensations: Pt with decreased ability to perform forward trunk flexion for reaching grooming items.  Pt tends to extend his head and rely on UE use if attempting to reach forward.   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  PROM WFLs for all joints but AROM shoulder flexion absent.  Elbow flexion and extension 2-/5.  Pt maintains flexed digits at rest but can demonstrate active digit extension slightly in the 4th and 5th digits but very minimal in the 2nd and 3rd as well as the thumb.   LUE Assessment LUE Assessment: Exceptions to [facility] LUE Strength LUE Overall Strength Comments: AAROM shoulder flexion WFLS, 2-/5 with AROM,  elbow flexion 4/5, elbow extension 3/5, grasp and release 4/5.  Decreased FM coordination with finger to thumb however.  Used functionally for selfcare tasks including brushing his teeth and washing his face.    See FIM for current functional status  Cindra Presume OTR/L 09/13/2013, 5:28  PM

## 2013-09-13 NOTE — Progress Notes (Signed)
Physical Therapy Session Note  Patient Details  Name: [name] MRN: [id number] Date of Birth: 27-Nov-1950  Today's Date: 09/13/2013 Time: 3151-7616 Time Calculation (min): 44 min  Short Term Goals: Week 2:  PT Short Term Goal 1 (Week 2): Pt will perform bed mobility with mod A of one person PT Short Term Goal 2 (Week 2): Pt will perform bed <> w/c transfers with max A of one person PT Short Term Goal 3 (Week 2): Pt will perform dynamic sitting balance activities with mod A x 10 minutes PT Short Term Goal 4 (Week 2): Pt will tolerated supported standing (standing frame or tilt table) for LE and core strengthening x 5 minutes with total A  Skilled Therapeutic Interventions/Progress Updates:   Pt received sitting in w/c in room, just finishing with SLP session.  Interpretor present during session to assist with translation as needed.  Assisted pt to/from therapy gym via w/c at total A level.  Performed slideboard transfer w/c <> mat with total A (pt assist approx 20%).  Continues to require max verbal cues for forward weight shift and hand placement.  Also provided assist at LEs to prevent forward translation off of board.  Once on therapy mat, continue to work on forward reaching while reaching and placing rings anteriorly with manual facilitation at trunk/ribs for upright posture with anterior pelvic tilt, as well as facilitation for forward head during reaching activities as he continues to lead head in extension during all activities due to fear of falling forward.  Progressed to working on forward leaning with BUEs to large blue physioball.  He continues to demonstrate increased anxiety and increased cervical extension during task.  Transferred back to w/c as stated above.  Assisted back to room and left in w/c with quick release belt and all needs in reach.    Therapy Documentation Precautions:  Precautions Precautions: Fall Precaution Comments: Limited WB on LLE due to possible  gout Restrictions Weight Bearing Restrictions: Yes LLE Weight Bearing: Partial weight bearing   Vital Signs: Therapy Vitals BP: 159/77 mmHg Pain: Pt with mild c/o pain in L ankle during transfer, better with repositioning.      See FIM for current functional status  Therapy/Group: Individual Therapy  Raquel Sarna A Genine Beckett 09/13/2013, 12:27 PM

## 2013-09-13 NOTE — Progress Notes (Signed)
Occupational Therapy Session Note  Patient Details  Name: [name] MRN: [id number] Date of Birth: 04/23/1950  Today's Date: 09/13/2013 Time: 8413-2440 Time Calculation (min): 44 min  Short Term Goals: Week 1:  OT Short Term Goal 1 (Week 1): Pt will maintain static sitting EOB with min assist for 3 mins in preparation for selfcare tasks. OT Short Term Goal 1 - Progress (Week 1): Not met OT Short Term Goal 2 (Week 1): Pt will self feed using the LUE primarily with AE and supervision. OT Short Term Goal 2 - Progress (Week 1): Not met OT Short Term Goal 3 (Week 1): Pt will perform 3 grooming tasks in supported sitting at the sink with supervision. OT Short Term Goal 3 - Progress (Week 1): Not met OT Short Term Goal 4 (Week 1): Pt will perform UB bathing with min assist in supported sitting.  OT Short Term Goal 4 - Progress (Week 1): Met OT Short Term Goal 5 (Week 1): Pt will perform toilet transfers to drop arm commode vs commode bench with total assist using sliding board.  OT Short Term Goal 5 - Progress (Week 1): Not met Week 3:  OT Short Term Goal 1 (Week 3): Pt will self feed using the LUE primarily with AE and supervision. OT Short Term Goal 2 (Week 3): Pt will perform toilet transfers to drop arm commode vs commode bench with total assist using sliding board.  OT Short Term Goal 3 (Week 3): Pt will roll side to side in bed to assist with peri hygiene with min assist to both the left and right.  OT Short Term Goal 4 (Week 3): Pt will transfer supine to sit EOB with max assist in preparation for selfcare tasks.   Skilled Therapeutic Interventions/Progress Updates:    Pt performed bathing supine to sit EOB.  Mod assist for rolling side to side in bed for washing peri area and donning brief.  He needs assistance with bending up his LEs as well as assistance from the posterior scapula for rolling.  Once transitioned to sitting he needed max assist to maintain static sitting balance, with  increased posterior lean.  Positioned wash pan on the bedside table and incorporated forward trunk flexion for reaching the washcloth for bathing.  Pt still with increased fear of forward flexion and needs max assist for support as well as mod assist for facilitation of the left shoulder to reach the washcloth.  Total assist for all dressing tasks sit to sidelying.  Needed total assist +2 (pt 20%) for transfer to the chair via the sliding board.   Therapy Documentation Precautions:  Precautions Precautions: Fall Precaution Comments: Limited WB on LLE due to possible gout Restrictions Weight Bearing Restrictions: Yes LLE Weight Bearing: Partial weight bearing  Pain: Pain Assessment Pain Assessment: Faces Faces Pain Scale: Hurts a little bit Pain Type: Acute pain Pain Location: Ankle Pain Intervention(s): Repositioned;Emotional support ADL: See FIM for current functional status  Therapy/Group: Individual Therapy  Cindra Presume OTR/L 09/13/2013, 12:28 PM

## 2013-09-13 NOTE — Progress Notes (Signed)
Recreational Therapy Discharge Summary Patient Details  Name: Ramsey Guadamuz MRN: [id number] Date of Birth: 1950-10-01 Today's Date: 09/13/2013  Long term goals set: 1  Long term goals met: 1  Comments on progress toward goals: Pt is scheduled for discharge to SNF tomorrow for 24 hour care & continued therapies.  Session focused on more complex tasks seated EOM during TR/PT or TR/OT co-treat in which pt requires up to max assist for task completion.  Pt observed completing simple tasks seated w/c level with supervision-set up assist-min assist.    Reasons for discharge: discharge from hospital Patient/family agrees with progress made and goals achieved: Yes  Waldon Reining 09/13/2013, 4:57 PM

## 2013-09-13 NOTE — Discharge Summary (Signed)
Physician Discharge Summary  Patient ID: [name] MRN: [id number] DOB/AGE: [birth year] 63 y.o.  Admit date: 08/28/2013 Discharge date: 09/14/2013  Discharge Diagnoses:  Principal Problem:   ICH (intracerebral hemorrhage) Active Problems:   Acute embolic stroke   HTN (hypertension)   Post-poliomyelitis muscular atrophy   Tendinitis of left ankle   Arthritis of ankle, left, degenerative   Gout attack   Dehydration   Depression with anxiety   Discharged Condition: Stable  Significant Diagnostic Studies: Dg Chest 2 View  08/29/2013   CLINICAL DATA:  Followup pleural effusion  EXAM: CHEST  2 VIEW  COMPARISON:  08/27/2013  FINDINGS: Cardiac shadow remains enlarged. The previously seen infiltrates have resolved in the interval. No sizable effusion is noted. No bony abnormality is noted.  IMPRESSION: No active cardiopulmonary disease.   Electronically Signed   By: Inez Catalina M.D.   On: 08/29/2013 11:19   Dg Chest 2 View  08/27/2013   CLINICAL DATA:  Stroke.  EXAM: CHEST  2 VIEW  COMPARISON:  Chest CT 07/21/2005.  FINDINGS: Patchy bilateral pulmonary infiltrates are noted most consistent with pneumonia, possibly related to aspiration. Interstitial pulmonary edema cannot be excluded. Cardiomegaly is present with normal pulmonary vascularity. No pleural effusion or pneumothorax.  IMPRESSION: Patchy pulmonary infiltrates noted bilaterally most consistent with pneumonia.   Electronically Signed   By: Marcello Moores  Register   On: 08/27/2013 12:21    Dg Ankle Left Port  08/26/2013   CLINICAL DATA:  Pain and limited range of motion  EXAM: PORTABLE LEFT ANKLE - 2 VIEW  COMPARISON:  None.  FINDINGS: Frontal and lateral views were obtained. There is no acute fracture. Ankle mortise appears intact. There is remodeling of the tailor dome suggesting prior trauma in this area. There is narrowing posteriorly consistent with a degree of osteoarthritic change. There is pes planus. Bones appear somewhat  osteoporotic.  IMPRESSION: Question old trauma to the talus with remodeling. 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 No acute fracture. Mortise appears grossly intact. No joint effusion. Bones osteoporotic.   Electronically Signed   By: Lowella Grip M.D.   On: 08/26/2013 13:44   Dg Foot 2 Views Left  09/05/2013   CLINICAL DATA:  Left foot pain and swelling.  Polio.  EXAM: LEFT FOOT - 2 VIEW  COMPARISON:  Left ankle radiographs 08/26/2013  FINDINGS: The bones are osteopenic. Deformity of the talus of flattening of the talar down is again seen as described on recent ankle radiographs and may reflect prior trauma. No acute fracture or dislocation is identified. Joint spaces in the foot are preserved. Soft tissue swelling is present about the midfoot and forefoot. Pes planus is noted. Vascular calcifications are noted.  IMPRESSION: No acute osseous abnormality identified. Mid and forefoot soft tissue swelling.   Electronically Signed   By: Logan Bores   On: 09/05/2013 16:22    Labs:  Basic Metabolic Panel:  Recent Labs Lab 09/08/13 0531 09/11/13 1115 09/14/13 0610  NA 140 140 138  K 4.8 4.2 4.9  CL 105 101 98  CO2 21 25 28   GLUCOSE 121* 131* 105*  BUN 66* 34* 43*  CREATININE 0.80 0.76 0.79  CALCIUM 9.1 9.9 9.5    CBC: CBC Latest Ref Rng 08/29/2013 08/26/2013 08/22/2013  WBC 4.0 - 10.5 K/uL 7.5 9.8 5.5  Hemoglobin 13.0 - 17.0 g/dL 13.8 13.4 15.7  Hematocrit 39.0 - 52.0 % 40.1 39.3 45.6  Platelets 150 - 400 K/uL 177 141(L) 158  CBG: No results found for this basename: GLUCAP,  in the last 168 hours  Brief HPI:   [name] is a 63 y.o. 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 CT head with acute  Intraparenchymal hemorrhage right basal ganglia. He had increase in lethargy on 05/14 and follow up CCT with additional multifocal ischemic infarcts involving bilateral parietal and temporal lobes as well as right occipital lobe. 2D echo with EF 65-70% with no wall abnormality. Carotid dopplers without ICA stenosis. Patient with resultant paraparesis as well as LUE weakness, apraxia, decreased verbal output with naming errors as well as difficulty with multistep commands. He was admitted to  [facility] for follow up therapies.    Hospital Course: [name] was admitted to rehab 08/28/2013 for inpatient therapies to consist of PT, ST and OT at least three hours five days a week. Past admission physiatrist, therapy team and rehab RN have worked together to provide customized collaborative inpatient rehab. Blood pressures have been monitored on tid basis and continue to be variable. He continued to have severe pain left foot and xrays done show degenerative changes. Left achilles tendinitis was treated with PRAFO and Voltaren gel. He developed edema and erythema of left foot due to gout flare and was treated with steroid dose pack. Uric acid elevated at 8.2. He developed acute renal insufficiency due to poor po intake with rise in BUN to 66. HCTZ was discontinued and he was started on IVF with improvement in renal status. This was discontinued on 06/02 but he continues to requires encouragement to push po fluids.    He has had depressed mood with elevated levels of stress on evaluation by Dr. Vikki Ports with psychology. He was started on Remeron to help with mood stabilization.  He continues to have poor awareness of deficits, cognitive linguistic deficits as well as paraparesis with LUE weakness. He has made limited progress and continues to requires is at mod to total assist due to significant physical and cognitive deficits.  Due to significant amount of assistance needed family has elected on SNF for further  therapies. He was discharged to [facility] on 09/14/13.    Rehab course: During patient's stay in rehab weekly team conferences were held to monitor patient's progress, set goals and discuss barriers to discharge. At [facility] he required total assist for self care tasks as well as all mobility.   Patient has had improvement in activity tolerance, balance, postural control, improved attention as well as ability to compensate for deficits.  He requires  +2 total assist--patient 20-30% for sliding board transfers. He requires mod to max assist for upper body care and max to total assist for lower body care. Left foot pain has improved but he is unable to tolerate standing due to pain. He continues to requires min to moderate assist for orientation and structural naming tasks. He has had improvement in ability to spontaneously express needs and wants verbally with fewer instances of anomia. He is unable to use compensatory strategies to compensate for deficits.    Disposition: Skilled Nursing facility.   Diet: Heart Healthy.   Special Instructions: 1. Please assist with meal set up as well as feeding. Needs full supervision at meals.  2. Offer  fluids frequently. 3. Recheck Lytes on 09/17/13    Medication List    STOP taking these medications       hydrochlorothiazide 25 MG tablet  Commonly known as:  HYDRODIURIL     naproxen 500 MG tablet  Commonly known as:  NAPROSYN      TAKE these medications       acetaminophen 325 MG tablet  Commonly known as:  TYLENOL  Take 1-2 tablets (325-650 mg total) by mouth every 4 (four) hours as needed for mild pain.     alum & mag hydroxide-simeth 200-200-20 MG/5ML suspension  Commonly known as:  MAALOX/MYLANTA  Take 30 mLs by mouth every 4 (four) hours as needed for indigestion.     amLODipine 10 MG tablet  Commonly known as:  NORVASC  Take 1 tablet (10 mg total) by mouth daily.     cloNIDine 0.2 MG tablet  Commonly known as:  CATAPRES  Take 0.2  mg by mouth 2 (two) times daily.     diclofenac sodium 1 % Gel  Commonly known as:  VOLTAREN  Apply 2 g topically 4 (four) times daily.     feeding supplement (ENSURE COMPLETE) Liqd  Take 237 mLs by mouth 2 (two) times daily between meals.     lisinopril 40 MG tablet  Commonly known as:  PRINIVIL,ZESTRIL  Take 1 tablet (40 mg total) by mouth daily.     mirtazapine 7.5 MG tablet  Commonly known as:  REMERON  Take 1 tablet (7.5 mg total) by mouth at bedtime.     pantoprazole 40 MG tablet  Commonly known as:  PROTONIX  Take 1 tablet (40 mg total) by mouth at bedtime.     protein supplement Powd  Take 6 g by mouth 3 (three) times daily with meals.     senna-docusate 8.6-50 MG per tablet  Commonly known as:  Senokot-S  Take 1 tablet by mouth 2 (two) times daily.     traMADol 50 MG tablet  Commonly known as:  ULTRAM  Take 1 tablet (50 mg total) by mouth every 6 (six) hours as needed (pain).       Follow-up Information   Follow up with Charlett Blake, MD On 10/29/2013. (Be there at 10:30   for 11 am   appointment)    Specialty:  Physical Medicine and Rehabilitation   Contact information:   [place] [address] [phone]       Follow up with Forbes Cellar, MD. Call today. (for follow up appointment in 4-6 weeks. )    Specialties:  Neurology, Radiology   Contact information:   [address] [place] [address] [phone]       Signed: Bary Leriche 09/14/2013, 9:00 AM

## 2013-09-14 ENCOUNTER — Telehealth: Payer: Self-pay | Admitting: Neurology

## 2013-09-14 DIAGNOSIS — I6340 Cerebral infarction due to embolism of unspecified cerebral artery: Secondary | ICD-10-CM | POA: Diagnosis not present

## 2013-09-14 DIAGNOSIS — R404 Transient alteration of awareness: Secondary | ICD-10-CM | POA: Diagnosis not present

## 2013-09-14 DIAGNOSIS — R279 Unspecified lack of coordination: Secondary | ICD-10-CM | POA: Diagnosis not present

## 2013-09-14 DIAGNOSIS — M3320 Polymyositis, organ involvement unspecified: Secondary | ICD-10-CM | POA: Diagnosis not present

## 2013-09-14 DIAGNOSIS — F17200 Nicotine dependence, unspecified, uncomplicated: Secondary | ICD-10-CM | POA: Diagnosis not present

## 2013-09-14 DIAGNOSIS — M109 Gout, unspecified: Secondary | ICD-10-CM | POA: Diagnosis not present

## 2013-09-14 DIAGNOSIS — M19072 Primary osteoarthritis, left ankle and foot: Secondary | ICD-10-CM | POA: Diagnosis present

## 2013-09-14 DIAGNOSIS — J189 Pneumonia, unspecified organism: Secondary | ICD-10-CM | POA: Diagnosis not present

## 2013-09-14 DIAGNOSIS — K219 Gastro-esophageal reflux disease without esophagitis: Secondary | ICD-10-CM | POA: Diagnosis not present

## 2013-09-14 DIAGNOSIS — C384 Malignant neoplasm of pleura: Secondary | ICD-10-CM | POA: Diagnosis not present

## 2013-09-14 DIAGNOSIS — I619 Nontraumatic intracerebral hemorrhage, unspecified: Secondary | ICD-10-CM | POA: Diagnosis not present

## 2013-09-14 DIAGNOSIS — J209 Acute bronchitis, unspecified: Secondary | ICD-10-CM | POA: Diagnosis not present

## 2013-09-14 DIAGNOSIS — M7752 Other enthesopathy of left foot: Secondary | ICD-10-CM | POA: Diagnosis present

## 2013-09-14 DIAGNOSIS — E860 Dehydration: Secondary | ICD-10-CM | POA: Diagnosis not present

## 2013-09-14 DIAGNOSIS — I10 Essential (primary) hypertension: Secondary | ICD-10-CM | POA: Diagnosis not present

## 2013-09-14 DIAGNOSIS — F329 Major depressive disorder, single episode, unspecified: Secondary | ICD-10-CM | POA: Diagnosis not present

## 2013-09-14 DIAGNOSIS — G40909 Epilepsy, unspecified, not intractable, without status epilepticus: Secondary | ICD-10-CM | POA: Diagnosis not present

## 2013-09-14 DIAGNOSIS — I6350 Cerebral infarction due to unspecified occlusion or stenosis of unspecified cerebral artery: Secondary | ICD-10-CM | POA: Diagnosis not present

## 2013-09-14 DIAGNOSIS — K5900 Constipation, unspecified: Secondary | ICD-10-CM | POA: Diagnosis not present

## 2013-09-14 DIAGNOSIS — M25579 Pain in unspecified ankle and joints of unspecified foot: Secondary | ICD-10-CM | POA: Diagnosis not present

## 2013-09-14 DIAGNOSIS — G8220 Paraplegia, unspecified: Secondary | ICD-10-CM | POA: Diagnosis not present

## 2013-09-14 DIAGNOSIS — F39 Unspecified mood [affective] disorder: Secondary | ICD-10-CM | POA: Diagnosis not present

## 2013-09-14 DIAGNOSIS — R05 Cough: Secondary | ICD-10-CM | POA: Diagnosis not present

## 2013-09-14 DIAGNOSIS — F3289 Other specified depressive episodes: Secondary | ICD-10-CM | POA: Diagnosis not present

## 2013-09-14 DIAGNOSIS — M6281 Muscle weakness (generalized): Secondary | ICD-10-CM | POA: Diagnosis not present

## 2013-09-14 DIAGNOSIS — I6789 Other cerebrovascular disease: Secondary | ICD-10-CM | POA: Diagnosis not present

## 2013-09-14 DIAGNOSIS — I69998 Other sequelae following unspecified cerebrovascular disease: Secondary | ICD-10-CM | POA: Diagnosis present

## 2013-09-14 DIAGNOSIS — R4789 Other speech disturbances: Secondary | ICD-10-CM | POA: Diagnosis not present

## 2013-09-14 DIAGNOSIS — R059 Cough, unspecified: Secondary | ICD-10-CM | POA: Diagnosis not present

## 2013-09-14 DIAGNOSIS — G1221 Amyotrophic lateral sclerosis: Secondary | ICD-10-CM | POA: Diagnosis not present

## 2013-09-14 DIAGNOSIS — F418 Other specified anxiety disorders: Secondary | ICD-10-CM | POA: Diagnosis present

## 2013-09-14 DIAGNOSIS — E46 Unspecified protein-calorie malnutrition: Secondary | ICD-10-CM | POA: Diagnosis not present

## 2013-09-14 DIAGNOSIS — B91 Sequelae of poliomyelitis: Secondary | ICD-10-CM | POA: Diagnosis not present

## 2013-09-14 DIAGNOSIS — Z5189 Encounter for other specified aftercare: Secondary | ICD-10-CM | POA: Diagnosis not present

## 2013-09-14 LAB — BASIC METABOLIC PANEL
BUN: 43 mg/dL — ABNORMAL HIGH (ref 6–23)
CALCIUM: 9.5 mg/dL (ref 8.4–10.5)
CO2: 28 meq/L (ref 19–32)
Chloride: 98 mEq/L (ref 96–112)
Creatinine, Ser: 0.79 mg/dL (ref 0.50–1.35)
GFR calc non Af Amer: >90 mL/min (ref >90)
Glucose, Bld: 105 mg/dL — ABNORMAL HIGH (ref 70–99)
Potassium: 4.9 mEq/L (ref 3.7–5.3)
SODIUM: 138 meq/L (ref 137–147)

## 2013-09-14 NOTE — Telephone Encounter (Signed)
Made the patient an appointment with DR. XU for July 14 @9 :30

## 2013-09-14 NOTE — Progress Notes (Signed)
Social Work Discharge Note Discharge Note  The overall goal for the admission was met for:   Discharge location: [facility]  Length of Stay: No-17 DAYS  Discharge activity level: Yes-MIN/MOD LEVEL  Home/community participation: Yes  Services provided included: MD, RD, PT, OT, SLP, RN, CM, TR, Pharmacy, Neuropsych and SW  Financial Services: Medicare and Medicaid  Follow-up services arranged: Other: NHP  Comments (or additional information):FAMILY UNABLE TO PROVIDE THE CARE PT REQUIRES AT THIS TIME NEEDS TO BE HIGHER LEVEL BEFORE RETURNING HOME.  Patient/Family verbalized understanding of follow-up arrangements: Yes  Individual responsible for coordination of the follow-up plan: DAUGHTER-[name]  Confirmed correct DME delivered: Elease Hashimoto 09/14/2013    Gardiner Rhyme Kahlia Lagunes

## 2013-09-14 NOTE — Plan of Care (Signed)
Problem: RH BLADDER ELIMINATION Goal: RH STG MANAGE BLADDER WITH ASSISTANCE STG Manage Bladder With min Assistance  Outcome: Not Progressing Patient is incontinent of bladder requires condom cath hs

## 2013-09-14 NOTE — Plan of Care (Signed)
Problem: RH BLADDER ELIMINATION Goal: RH STG MANAGE BLADDER WITH ASSISTANCE STG Manage Bladder With min Assistance  Outcome: Not Met (add Reason) Total A-Pt utilizing condom cath, and brief

## 2013-09-14 NOTE — Telephone Encounter (Signed)
[name] with [name], called to schedule 4-6 wk fu with Dr Leonie Man per discharge papers.  I scheduled appointment with Jeani Hawking in September, but she wanted to check if it was ok for patient wait.  Please call and advise.  [phone]

## 2013-09-14 NOTE — Plan of Care (Signed)
Pt discharged to skilled nursing facility. Bath given, personal belonging packed. Report called to RN supervision at [facility]. Discharge documentation provided to transporter.

## 2013-09-14 NOTE — Plan of Care (Signed)
Problem: RH BOWEL ELIMINATION Goal: RH STG MANAGE BOWEL WITH ASSISTANCE STG Manage Bowel with min Assistance.  Outcome: Not Met (add Reason) Pt remained incontinent

## 2013-09-14 NOTE — Progress Notes (Signed)
[place] Physician Certication Statement   SECTION I - GENERAL INFORMATION   Patient's Name: [name] Date of Birth: Jun 11, 1950 Medicare #: [id number] A Transport Date: June 5,2015 (PCS is valid for round trips on this date and for all repetitive trips in the 60-day range as noted below)  Origin: [id number] [place] Destination: [facility] (Is the pt's stay covered under Medicare Part A (PPS/DRG): yes Closest appropriate facility?: yes If No, why is transport to more distant facility required?   (If hosp-hosp transfer, describe services needed at 2nd facility not available at 1st facility)   If hospice pt, is this transport related to pt's terminal illness? . no . If NO Describe: NOT A HOSPICE PATIENT  [place]   Ambulance Transportation is medically necessary only if other means of transport are contraindicated or would be potentially harmful to  the patient. To meet this requirement, the patient must be either "bed confined" or suffer from a condition such that transport by means  other than ambulance is contraindicated by the patient's condition The following questions must be answered by the medical  professional signing below for this form to be valid:   1) Describe the MEDICAL CONDITION (physical and/or mental) of this patient AT [place] that requires  the patient to be transported in an ambulance and why transport by other means is contraindicated by the patient's condition: PATIENT HAS HAD A STROKE AND HAS POST POLIO SYNDROME NEEDS CONSTANT CARE  2) Is this patient "bed confined" as defined below? yes To be "bed confined" the patient must satisfy all three of the following conditions: (1) unable to get up from bed without Assistance; AND (2) unable to ambulate; AND (3) unable to sit in a chair or wheelchair   3)  Can this patient safely be transported by car or wheelchair van  (i.e., seated during transport, without a medical attendant or monitoring?) no .  4) In addition to completing questions 1-3 above, please check any of the following conditions that apply*: Contractures and Medical attendant required  *Note: supporting documentation for any boxes checked must be maintained in the patient's medical records   .  Marland Kitchen   SECTION III - SIGNATURE OF PHYSICIAN OR HEALTHCARE PROFESSIONAL   I certify that the above information is true and correct based on my evaluation of this patient, and represent that the patient requires  transport by ambulance and that other forms of transport are contraindicated. I understand that this information will be used by the  Centers for Medicare and Medicaid Services (CMS) to support the determination of medical necessity for ambulance services, and I  represent that I have personal knowledge of the patient's condition at the time of transport.   .  If "YES" is selected: yes, I also certify that the patient is physically or mentally incapable of signing the ambulance service's claim and that the institution with which I am affiliated has furnished care, services or assistance to the patient. My signature below is made on behalf of the patient pursuant to 42 CFR 424.36(b)(4). In accordance with 42 CFR 424.37, the specific reason(s) that the patient is physically or  mentally incapable of signing the claim form is as follows:    Physician* or Healthcare Professional: his attending physician   Signature of Physician or Healthcare Professional: ___DR. ANDREW KIRSTEINS______________________  Date [id number]:FIEP 3,2951 (For scheduled repetitive transport, this form is not valid for  transports performed more  than 60 days after this date).   Printed Name and Credentials of Physician or Neurosurgeon (MD, DO, RN, etc.)  BECKY Magalie Almon, LCSW *Form must be signed only by patient's attending physician for scheduled, repetitive  transports. For non-repetitive, unscheduled ambulance  transports, if unable to obtain the signature of the attending physician, any of the following may sign (please check appropriate box below): Discharge Planner  .   [facility], [address], [place], [place], [place]

## 2013-09-14 NOTE — Progress Notes (Signed)
Speech Language Pathology Discharge Summary  Patient Details  Name: [name] MRN: [id number] Date of Birth: Jun 14, 1950  Today's Date: 09/14/2013  Patient has met 1 of 3 long term goals.  Patient to discharge at overall Mod;Max;Min level.  Reasons goals not met: Mod assist for verbal expression and Max assist for recall of new information   Clinical Impression/Discharge Summary: Patient has made functional gains and has met 1 of 3 long term goals this reporting period due to improved ability to recall and verbalize orientation with choice of three for place, Mod cues to verbally express needs and wants and Max assist to recall new information such as sequence to initiate a transfer.  Despite not meeting goals, patient's ability to verbally express spontaneous needs and wants has fewer instances of anomia; however, during moments of demands when expression break downs occur patient is unable to utilize strategies to assist himself.   Patient and family education ongoing. Patient would benefit from continued skilled SLP intervention at the next level of care to maximize his functional independence and communication with caregivers.    Care Partner:  Caregiver Able to Provide Assistance: No  Type of Caregiver Assistance: Physical;Cognitive  Recommendation:  24 hour supervision/assistance;Skilled Nursing facility  Rationale for SLP Follow Up: Maximize cognitive function and independence;Reduce caregiver burden;Maximize functional communication   Equipment: none   Reasons for discharge: Discharged from hospital   Patient/Family Agrees with Progress Made and Goals Achieved: Yes   See FIM for current functional status  [name]., CCC-SLP [place] 09/14/2013, 8:16 AM

## 2013-09-14 NOTE — Progress Notes (Signed)
Subjective/Complaints: 63 y.o. 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 CT head with acute Intraparenchymal hemorrhage right basal ganglia. He had increase in lethargy on 05/14 and follow up CCT with additional multifocal ischemic infarcts involving bilateral parietal and temporal lobes as well as right occipital lobe.. Patient with resultant paraparesis as well as LUE weakness, apraxia, decreased verbal output with naming errors as well as difficulty with multistep commands  Informed pt of SNF transfer today.   Smiling, limited English plus severe cog defs  interpreter this am, friend here who states he will help pt at home Review of Systems - limited, poor awareness of def Objective: Vital Signs: Blood pressure 170/92, pulse 67, temperature 98.3 F (36.8 C), temperature source Oral, resp. rate 18, height 5' (1.524 m), weight 58.6 kg (129 lb 3 oz), SpO2 98.00%. No results found. Results for orders placed during the hospital encounter of 08/28/13 (from the past 72 hour(s))  BASIC METABOLIC PANEL     Status: Abnormal   Collection Time    09/11/13 11:15 AM      Result Value Ref Range   Sodium 140  137 - 147 mEq/L   Potassium 4.2  3.7 - 5.3 mEq/L   Chloride 101  96 - 112 mEq/L   CO2 25  19 - 32 mEq/L   Glucose, Bld 131 (*) 70 - 99 mg/dL   BUN 34 (*) 6 - 23 mg/dL   Creatinine, Ser 0.76  0.50 - 1.35 mg/dL   Calcium 9.9  8.4 - 10.5 mg/dL   GFR calc non Af Amer >90  >90 mL/min   GFR calc Af Amer >90  >90 mL/min   Comment: (NOTE)     The eGFR has been calculated using the CKD EPI equation.     This calculation has not been validated in all clinical situations.     eGFR's persistently <90 mL/min signify possible  Chronic Kidney     Disease.  BASIC METABOLIC PANEL     Status: Abnormal   Collection Time    09/14/13  6:10 AM      Result Value Ref Range   Sodium 138  137 - 147 mEq/L   Potassium 4.9  3.7 - 5.3 mEq/L   Chloride 98  96 - 112 mEq/L   CO2 28  19 - 32 mEq/L   Glucose, Bld 105 (*) 70 - 99 mg/dL   BUN 43 (*) 6 - 23 mg/dL   Creatinine, Ser 0.79  0.50 - 1.35 mg/dL   Calcium 9.5  8.4 - 10.5 mg/dL   GFR calc non Af Amer >90  >90 mL/min   GFR calc Af Amer >90  >90 mL/min   Comment: (NOTE)     The eGFR has been calculated using the CKD EPI equation.     This calculation has not been validated in all clinical situations.     eGFR's persistently <90 mL/min signify possible Chronic Kidney     Disease.      General: No acute distress Mood and affect are appropriate Heart: Regular rate and rhythm no rubs murmurs or extra sounds Lungs: Clear to auscultation, breathing unlabored, no rales or wheezes Abdomen: Positive bowel sounds, soft nontender to palpation, nondistended Extremities:  no edema, atrophy in RUE with fasiculations in [place], BLE atrophy in thigh , leg and feet Skin: No evidence of breakdown, no evidence of rash Neurologic: Cranial nerves II through XII intact, motor strength is 0/5 in RUE 4/5 in left deltoid, bicep, tricep, grip, 3-/5 bilateral hip flexor,0/5 in Bilateral  knee extensors, ankle dorsiflexor and plantar flexor, increased tone LUE, decreased tone RUE and BLE Sensory exam limited by language  Musculoskeletal: Full passive range of motion in all 4 extremities.-pain to palp Left Achilles or left foot, - pain with passive ankle DF  Frog leg posture  No joint swelling, has dependant edema distal to [facility]   Assessment/Plan: 1. 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 Stable for SNF  today FIM: FIM - Bathing Bathing Steps Patient Completed: Chest;Right Arm;Abdomen;Front perineal area;Left upper leg;Right upper leg Bathing: 1: Two helpers  FIM - Upper Body Dressing/Undressing Upper body dressing/undressing steps patient completed: Thread/unthread right sleeve of pullover shirt/dresss Upper body dressing/undressing: 1: Total-Patient completed less than 25% of tasks FIM - Lower Body Dressing/Undressing Lower body dressing/undressing: 1: Two helpers  FIM - Toileting Toileting: 1: Total-Patient completed zero steps, helper did all 3  FIM - Radio producer Devices:  (commode bench) Toilet Transfers: 0-Activity did not occur  FIM - Control and instrumentation engineer Devices: Sliding board;Arm rests Bed/Chair Transfer: 1: Chair or W/C > Bed: Total A (helper does all/Pt. < 25%);1: Bed > Chair or W/C: Total A (helper does all/Pt. < 25%)  FIM - Locomotion: Wheelchair Locomotion: Wheelchair: 0: Activity did not occur FIM - Locomotion: Ambulation Locomotion: Ambulation: 0: Activity did not occur  Comprehension Comprehension Mode: Auditory Comprehension: 4-Understands basic 75 - 89% of the time/requires cueing 10 - 24% of the time  Expression Expression Mode: Verbal Expression: 3-Expresses basic 50 - 74% of the time/requires cueing 25 - 50% of the time. Needs to repeat parts of sentences.  Social Interaction Social Interaction: 4-Interacts appropriately 75 - 89% of the time - Needs redirection for appropriate language or to initiate interaction.  Problem Solving Problem Solving: 3-Solves basic 50 - 74% of the time/requires cueing 25 - 49% of the time  Memory Memory: 2-Recognizes or recalls 25 - 49% of the time/requires cueing 51 - 75% of the time   Medical Problem List and Plan:  1. 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 DVT Prophylaxis/Anticoagulation:  Pharmaceutical: Lovenox  3. Pain Management: Used naprosyn bid at home. May need to schedule tylenol--monitor for now.  4. Mood: Provide ego support. LCSW to follow for evaluation and support.  5. Neuropsych: This patient is is not capable of making decisions on his own behalf. Defer financial decisions to POA 6. HTN: mainly systolic in the early morning improving further adjustment in SNF setting 8. 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 Routine pressure relief measures-- will order air mattress overlay.  9.  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  Left ankle tendinitis/OA tx with PRAFO and voltaren gel,   ankle film shows OA, foot xray unremarkable   WBAT LOS (Days) 17 A FACE TO FACE EVALUATION WAS PERFORMED  Charlett Blake 09/14/2013, 7:43 AM

## 2013-09-16 DIAGNOSIS — M3320 Polymyositis, organ involvement unspecified: Secondary | ICD-10-CM | POA: Diagnosis not present

## 2013-09-16 DIAGNOSIS — F329 Major depressive disorder, single episode, unspecified: Secondary | ICD-10-CM | POA: Diagnosis not present

## 2013-09-16 DIAGNOSIS — I10 Essential (primary) hypertension: Secondary | ICD-10-CM | POA: Diagnosis not present

## 2013-10-03 DIAGNOSIS — R059 Cough, unspecified: Secondary | ICD-10-CM | POA: Diagnosis not present

## 2013-10-03 DIAGNOSIS — R05 Cough: Secondary | ICD-10-CM | POA: Diagnosis not present

## 2013-10-03 DIAGNOSIS — J209 Acute bronchitis, unspecified: Secondary | ICD-10-CM | POA: Diagnosis not present

## 2013-10-23 ENCOUNTER — Ambulatory Visit: Payer: Self-pay | Admitting: Neurology

## 2013-10-25 ENCOUNTER — Encounter (INDEPENDENT_AMBULATORY_CARE_PROVIDER_SITE_OTHER): Payer: Self-pay

## 2013-10-25 ENCOUNTER — Ambulatory Visit (INDEPENDENT_AMBULATORY_CARE_PROVIDER_SITE_OTHER): Payer: Medicare Other | Admitting: Neurology

## 2013-10-25 ENCOUNTER — Other Ambulatory Visit: Payer: Self-pay | Admitting: *Deleted

## 2013-10-25 ENCOUNTER — Encounter: Payer: Self-pay | Admitting: Neurology

## 2013-10-25 VITALS — BP 147/90 | HR 78

## 2013-10-25 DIAGNOSIS — I6340 Cerebral infarction due to embolism of unspecified cerebral artery: Secondary | ICD-10-CM

## 2013-10-25 DIAGNOSIS — I639 Cerebral infarction, unspecified: Secondary | ICD-10-CM

## 2013-10-25 DIAGNOSIS — I6350 Cerebral infarction due to unspecified occlusion or stenosis of unspecified cerebral artery: Secondary | ICD-10-CM

## 2013-10-25 DIAGNOSIS — Z79899 Other long term (current) drug therapy: Secondary | ICD-10-CM

## 2013-10-25 DIAGNOSIS — I10 Essential (primary) hypertension: Secondary | ICD-10-CM

## 2013-10-25 MED ORDER — ASPIRIN EC 81 MG PO TBEC
81.0000 mg | DELAYED_RELEASE_TABLET | Freq: Every day | ORAL | Status: DC
Start: 2013-10-25 — End: 2013-12-17

## 2013-10-25 MED ORDER — ATORVASTATIN CALCIUM 10 MG PO TABS: 10.0000 mg | ORAL_TABLET | Freq: Every day | ORAL | Status: DC

## 2013-10-25 NOTE — Patient Instructions (Signed)
1. Better Bp control, goal 130/80 2. Some stroke labs including A1C, TSH and free T4 3. Add ASA 81mg  and Lipitor 10mg  daily for stroke prevention 4. Follow up in clinic in 2 months.

## 2013-10-25 NOTE — Progress Notes (Signed)
STROKE NEUROLOGY FOLLOW UP NOTE  NAME: Shamarcus Hoheisel DOB: 1950-06-19  REASON FOR VISIT: stroke follow up HISTORY FROM: from daughter and chart  Today we had the pleasure of seeing Deadrick Stidd in follow-up at our Neurology Clinic. Pt was accompanied by daughter.   History Summary Friend Dorfman is a 63 y.o. 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 Per daughter he was able to walk with assistance of cain but over the few days prior to admission he had been so weak he could not walk. 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 Per daughter he takes his BP medications only when he feels ill but not when he is feeling good--same with ASA. in the ED he was noted to have BP 240/108, 235/111, 264/140. He was admitted to the medical ICU for further evaluation and treatment. His blood pressure was gradually controlled and he has remained neurologically stable. He also has a remote history of right medial thalamic intracerebral hemorrhage in 2012 from which he apparently made good recovery. He has history of childhood polio with significant deformities in both feet and hands but at baseline is able to ambulate with a cane. He was discharged letter to rehab.  Interval History  During the interval time, the patient has been doing stable. He came from rehab for the visit today with his daughter. His BP 147/90 today a little higher than goal. He has been taking clonidine, amlodipine, lisinopril for BP control. He still has some left UE weakness but LE seems improved much. He still has right sided weakness but more on the UE, not consistent with polio but more due to previous stroke.    The following represents the patient's updated allergies and side effects list: No Known Allergies  Labs since last visit of relevance include the following: Results for orders placed during the  hospital encounter of 08/28/13  COMPREHENSIVE METABOLIC PANEL      Result Value Ref Range   Sodium 140  137 - 147 mEq/L   Potassium 4.6  3.7 - 5.3 mEq/L   Chloride 104  96 - 112 mEq/L   CO2 23  19 - 32 mEq/L   Glucose, Bld 106 (*) 70 - 99 mg/dL   BUN 19  6 - 23 mg/dL   Creatinine, Ser 0.76  0.50 - 1.35 mg/dL   Calcium 9.3  8.4 - 10.5 mg/dL   Total Protein 7.2  6.0 - 8.3 g/dL   Albumin 2.9 (*) 3.5 - 5.2 g/dL   AST 29  0 - 37 U/L   ALT 31  0 - 53 U/L   Alkaline Phosphatase 52  39 - 117 U/L   Total Bilirubin 0.9  0.3 - 1.2 mg/dL   GFR calc non Af Amer >90  >90 mL/min   GFR calc Af Amer >90  >90 mL/min  CBC WITH DIFFERENTIAL      Result Value Ref Range   WBC 7.5  4.0 - 10.5 K/uL   RBC 4.49  4.22 - 5.81 MIL/uL   Hemoglobin 13.8  13.0 - 17.0 g/dL   HCT 40.1  39.0 - 52.0 %   MCV 89.3  78.0 - 100.0 fL   MCH 30.7  26.0 - 34.0 pg   MCHC 34.4  30.0 - 36.0 g/dL   RDW 13.2  11.5 - 15.5 %  Platelets 177  150 - 400 K/uL   Neutrophils Relative % 68  43 - 77 %   Neutro Abs 5.1  1.7 - 7.7 K/uL   Lymphocytes Relative 18  12 - 46 %   Lymphs Abs 1.4  0.7 - 4.0 K/uL   Monocytes Relative 11  3 - 12 %   Monocytes Absolute 0.8  0.1 - 1.0 K/uL   Eosinophils Relative 3  0 - 5 %   Eosinophils Absolute 0.2  0.0 - 0.7 K/uL   Basophils Relative 0  0 - 1 %   Basophils Absolute 0.0  0.0 - 0.1 K/uL  URIC ACID      Result Value Ref Range   Uric Acid, Serum 8.2 (*) 4.0 - 7.8 mg/dL  BASIC METABOLIC PANEL      Result Value Ref Range   Sodium 135 (*) 137 - 147 mEq/L   Potassium 5.0  3.7 - 5.3 mEq/L   Chloride 96  96 - 112 mEq/L   CO2 24  19 - 32 mEq/L   Glucose, Bld 128 (*) 70 - 99 mg/dL   BUN 41 (*) 6 - 23 mg/dL   Creatinine, Ser 0.98  0.50 - 1.35 mg/dL   Calcium 10.0  8.4 - 10.5 mg/dL   GFR calc non Af Amer 86 (*) >90 mL/min   GFR calc Af Amer >90  >90 mL/min  BASIC METABOLIC PANEL      Result Value Ref Range   Sodium 137  137 - 147 mEq/L   Potassium 5.5 (*) 3.7 - 5.3 mEq/L   Chloride 99  96 -  112 mEq/L   CO2 21  19 - 32 mEq/L   Glucose, Bld 139 (*) 70 - 99 mg/dL   BUN 41 (*) 6 - 23 mg/dL   Creatinine, Ser 0.74  0.50 - 1.35 mg/dL   Calcium 10.3  8.4 - 10.5 mg/dL   GFR calc non Af Amer >90  >90 mL/min   GFR calc Af Amer >90  >90 mL/min  BASIC METABOLIC PANEL      Result Value Ref Range   Sodium 140  137 - 147 mEq/L   Potassium 4.8  3.7 - 5.3 mEq/L   Chloride 105  96 - 112 mEq/L   CO2 21  19 - 32 mEq/L   Glucose, Bld 121 (*) 70 - 99 mg/dL   BUN 66 (*) 6 - 23 mg/dL   Creatinine, Ser 0.80  0.50 - 1.35 mg/dL   Calcium 9.1  8.4 - 10.5 mg/dL   GFR calc non Af Amer >90  >90 mL/min   GFR calc Af Amer >90  >90 mL/min  BASIC METABOLIC PANEL      Result Value Ref Range   Sodium 140  137 - 147 mEq/L   Potassium 4.2  3.7 - 5.3 mEq/L   Chloride 101  96 - 112 mEq/L   CO2 25  19 - 32 mEq/L   Glucose, Bld 131 (*) 70 - 99 mg/dL   BUN 34 (*) 6 - 23 mg/dL   Creatinine, Ser 0.76  0.50 - 1.35 mg/dL   Calcium 9.9  8.4 - 10.5 mg/dL   GFR calc non Af Amer >90  >90 mL/min   GFR calc Af Amer >90  >90 mL/min  BASIC METABOLIC PANEL      Result Value Ref Range   Sodium 138  137 - 147 mEq/L   Potassium 4.9  3.7 - 5.3 mEq/L   Chloride 98  96 - 112 mEq/L   CO2 28  19 - 32 mEq/L   Glucose, Bld 105 (*) 70 - 99 mg/dL   BUN 43 (*) 6 - 23 mg/dL   Creatinine, Ser 0.79  0.50 - 1.35 mg/dL   Calcium 9.5  8.4 - 10.5 mg/dL   GFR calc non Af Amer >90  >90 mL/min   GFR calc Af Amer >90  >90 mL/min    The neurologically relevant items on the patient's problem list were reviewed on today's visit.  Neurologic Examination  A problem focused neurological exam (12 or more points of the single system neurologic examination, vital signs counts as 1 point, cranial nerves count for 8 points) was performed.  Blood pressure 147/90, pulse 78, height 0' (0 m), weight 0 lb (0 kg).  General - thin, well developed, in no apparent distress, pleasant, limited english.  Ophthalmologic - not able to see  through.  Cardiovascular - Regular rate and rhythm with no murmur.  Mental Status -  Level of arousal and orientation to place, and person were intact,  But not to time. Language including expression, naming, repetition, comprehension was tried to assess but not successful due to limited [place] but as per daughter, he is speaking vietnamese no problem.   Cranial Nerves II - XII - II - Vision intact OU. III, IV, VI - Extraocular movements intact. V - Facial sensation intact bilaterally. VII - Facial movement intact bilaterally. VIII - Hearing & vestibular intact bilaterally. X - Palate elevates symmetrically. XI - Chin turning & shoulder shrug intact bilaterally. XII - Tongue protrusion intact.  Motor Strength - The patient's strength was 0/5 R shoulder, 1/5 R bicep and 0/5 R tricep and 3+/5 finger grip. Left 3/5 shoulder, 3/5 bicep and tricep and 4+/5 finger grip. RLE 0/5 and LLE 3-/5 proximal and 2/5 distally.  Motor Tone - Muscle tone was assessed at the neck and appendages and was normal.  Reflexes - The patient's reflexes were decreased in all extremities and he had no pathological reflexes.  Sensory - Light touch, temperature/pinprick were assessed and were symmetrical.    Coordination - not able to perform.  Tremor was absent.  Gait and Station - not able to test.  Data reviewed: I personally reviewed the images and agree with the radiology interpretations.  11/18/2010 MRI -  1.  Stable hemorrhagic infarct of the medial right thalamus with mass effect extending into the third ventricle but no intraventricular hemorrhage. 2.  More remote punctate hemorrhage in the posterior right thalamus. 3.  Multiple remote lacunar infarcts involving the basal ganglia bilaterally, corpus callosum, and the cerebellum. 4.  Extensive white matter disease. MRA -  1.  Extensive small vessel disease. 2.  Moderate stenosis of the distal right M1 segment with asymmetric signal loss in the right  greater than left MCA branch vessels. 3.  Potential high-grade stenosis of the proximal left A1 segment. CT head -   1.  Small right medial thalamic hematoma, likely a hypertensive bleed. 2.  Mass effect on the third ventricle but no hydrocephalus. 3.  Multiple remote appearing lacunar type infarcts  08/23/2013 MRI -  1. Similar appearance of right basal ganglia hemorrhage, compatible with acute hemorrhagic infarct related to history of malignant hypertension. 2. Additional multi focal ischemic infarcts involving the bilateral parietal and temporal lobes as well as the right occipital lobe as above. 3. Remote infarcts involving the left basal ganglia/left corona radiata and right middle cerebellar peduncle. 4. Atrophy with  advanced chronic microvascular ischemic disease. CT head - A 1.5 x 1.6 cm intraparenchymal hemorrhage within the right basal ganglial region. 2D echo - - Left ventricle: The cavity size was normal. There was moderate concentric hypertrophy. Systolic function was vigorous. The estimated ejection fraction was 75%, in the range of 65% to 70%. Wall motion was normal; there were no regional wall motion abnormalities. Doppler parameters are consistent with abnormal left ventricular relaxation (grade 1 diastolic dysfunction). - Aortic valve: Trileaflet; normal thickness leaflets. No regurgitation. - Mitral valve: No regurgitation. - Right ventricle: Systolic function was normal. - Right atrium: The atrium was normal in size. - Tricuspid valve: No regurgitation. - Pulmonary arteries: Systolic pressure was within the normal range. - Pericardium, extracardiac: There was no pericardial effusion. Carotid doppler The vertebral arteries appear patent with antegrade flow. - Findings consistent with 1-39 percent stenosis involving the right internal carotid artery and the left internal carotid artery.  Component     Latest Ref Rng 08/23/2013  Cholesterol     0 - 200 mg/dL  157  Triglycerides     <150 mg/dL 168 (H)  HDL     >39 mg/dL 34 (L)  Total CHOL/HDL Ratio      4.6  VLDL     0 - 40 mg/dL 34  LDL (calc)     0 - 99 mg/dL 89    Assessment: As you may recall, he is a 63 y.o.  Asian male with a diagnosis of right BG bleeding. He also had right thalamic bleeding in 2012 but with minimal deficit residue. He also have multiple lacunar strokes in the MRI. 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 However, he did have multiple cortical punctate strokes found on current MRI which also raise concern of embolic stroke. His carotid doppler seems not a concern and cardioembolic will need to be considered. But I will hold off any further testing include TCD MES detection, cardionet or loop recorder due to he is not a candidate for anticoagulation therapy. I will add him back on baby ASA and low dose statin. Check some stroke labs today.  Diagnoses from this visit: Stroke - Plan: atorvastatin (LIPITOR) 10 MG tablet, aspirin EC 81 MG tablet  Plan:  - continue BP control as per rehab and our goal is 130/80 - stroke labs including A1C and thyroid function - start ASA 81mg  and low dose lipitor 10mg  for stroke prevention - hold off any further testing include TCD MES detection, cardionet or loop recorder due to he is not a candidate for anticoagulation therapy - follow up in clinic in 2 months   Patient Instructions  1. Better Bp control, goal 130/80 2. Some stroke labs including A1C, TSH and free T4 3. Add ASA 81mg  and Lipitor 10mg  daily for stroke prevention 4. Follow up in clinic in 2 months.    Rosalin Hawking, MD PhD [facility] [address], [place], [place] [address] [phone]

## 2013-10-29 ENCOUNTER — Encounter: Payer: Self-pay | Admitting: Physical Medicine & Rehabilitation

## 2013-10-29 ENCOUNTER — Telehealth: Payer: Self-pay | Admitting: Neurology

## 2013-10-29 ENCOUNTER — Ambulatory Visit (HOSPITAL_BASED_OUTPATIENT_CLINIC_OR_DEPARTMENT_OTHER): Payer: Medicare Other | Admitting: Physical Medicine & Rehabilitation

## 2013-10-29 ENCOUNTER — Encounter: Payer: No Typology Code available for payment source | Attending: Physical Medicine & Rehabilitation

## 2013-10-29 VITALS — BP 126/80 | HR 60 | Resp 14

## 2013-10-29 DIAGNOSIS — B91 Sequelae of poliomyelitis: Secondary | ICD-10-CM | POA: Insufficient documentation

## 2013-10-29 DIAGNOSIS — I69998 Other sequelae following unspecified cerebrovascular disease: Secondary | ICD-10-CM | POA: Diagnosis not present

## 2013-10-29 DIAGNOSIS — I619 Nontraumatic intracerebral hemorrhage, unspecified: Secondary | ICD-10-CM | POA: Insufficient documentation

## 2013-10-29 DIAGNOSIS — I6340 Cerebral infarction due to embolism of unspecified cerebral artery: Secondary | ICD-10-CM

## 2013-10-29 DIAGNOSIS — I10 Essential (primary) hypertension: Secondary | ICD-10-CM | POA: Insufficient documentation

## 2013-10-29 DIAGNOSIS — I639 Cerebral infarction, unspecified: Secondary | ICD-10-CM

## 2013-10-29 DIAGNOSIS — I6350 Cerebral infarction due to unspecified occlusion or stenosis of unspecified cerebral artery: Secondary | ICD-10-CM | POA: Diagnosis not present

## 2013-10-29 DIAGNOSIS — F17200 Nicotine dependence, unspecified, uncomplicated: Secondary | ICD-10-CM | POA: Insufficient documentation

## 2013-10-29 DIAGNOSIS — G14 Postpolio syndrome: Secondary | ICD-10-CM

## 2013-10-29 NOTE — Progress Notes (Signed)
Subjective:    Patient ID: [name], male    DOB: June 07, 1950, 63 y.o.   MRN: [id number]  HPI 63 y.o. 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 CT head with acute Intraparenchymal hemorrhage right basal ganglia. He had increase in lethargy on 05/14 and follow up CCT with additional multifocal ischemic infarcts involving bilateral parietal and temporal lobes as well as right occipital lobe. 2D echo with EF 65-70% with no wall abnormality. Carotid dopplers without ICA stenosis. Patient with resultant paraparesis as well as LUE weakness, apraxia, decreased verbal output with naming errors as well as difficulty with multistep commands. He [facility] to  [facility] for follow up therapies.   He has had depressed mood with elevated levels of stress on evaluation by Dr. Vikki Ports with psychology. He was started on Remeron to help with mood stabilization. He continues to have poor awareness of deficits, cognitive linguistic deficits as well as paraparesis with LUE weakness. He has made limited progress and continues to requires is at mod to total assist due to significant physical and cognitive deficits.  Due to significant amount of assistance needed family has elected on SNF for further therapies. He was discharged to [facility] on 09/14/13.   No falls, no pains since being at the skilled nursing facility over the last 6 weeks. Had a followup appointment with neurology. No further testing recommended.   Pain Inventory Average Pain 0 Pain Right Now 0 My pain is n/a  In the last 24 hours, has pain interfered with the following? General activity 0 Relation with others 0 Enjoyment of life 0 What TIME of day is your pain at its  worst? n/a Sleep (in general) Fair  Pain is worse with: n/a Pain improves with: n/a Relief from Meds: n/a  Mobility use a wheelchair needs help with transfers  Function I need assistance with the following:  feeding, dressing, bathing, toileting, meal prep, household duties and shopping  Neuro/Psych trouble walking  Prior Studies Any changes since last visit?  no  Physicians involved in your care Any changes since last visit?  no   Family History  Problem Relation Age of Onset  . Hypertension Mother   . Hypertension Father    History   Social History  . Marital Status: Married    Spouse Name: Catha Nottingham    Number of Children: 3  . Years of Education: 11th   Occupational History  . retired    Social History Main Topics  . Smoking status: Current Every Day Smoker    Types: Cigarettes  . Smokeless tobacco: None  . Alcohol Use: No  . Drug Use: No  . Sexual Activity: No   Other Topics Concern  . None   Social History Narrative   Patient is currently in [place]   Patient is right handed   Patient drink tea,coffee         History reviewed. No pertinent past surgical history. Past Medical History  Diagnosis Date  . Hypertension   . Polio    BP 126/80  Pulse 60  Resp 14  SpO2 98%  Opioid Risk Score:   Fall Risk Score: Moderate Fall Risk (6-13 points) (patient educated handout declined)   Review of  Systems  Musculoskeletal: Positive for gait problem.  All other systems reviewed and are negative.      Objective:   Physical Exam   The patient's strength was 0/5 R shoulder, 1/5 R bicep and 0/5 R tricep and 3+/5 finger grip. Left 3-/5 shoulder, 3/5 bicep and 3- tricep and 4+/5 finger grip. RLE 1/5 HF,2-/5 KE, 0 ankle DF/PF2- toe flexion and LLE 3-/5 HF, 1/5 KE proximal and 2-/5 toe flexion, 0 ankle DF/PF 2 minus left hamstring Sensory testing he appears intact in the upper extremities although some communication deficits interferes with full  participation. Decreased sensation to light touch both lower extremities Fasciculations R triceps bilateral quads Non-ambulatory  Mental status alert smiling follows commands with cueing visually as well as interpretation from his daughter  No pain with shoulder elbow and wrist hip knee or ankle range of motion    Assessment & Plan:  1. 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 He made a trip to [place] and came back nonambulatory, this is diffcult to believe given the chronic atrophy and fasiculations noted in RUE and BLEs  The patient has gained some right knee extension since discharge from rehabilitation unit in the hospital. This is not enough to help with ambulation however. Recommend continued PT and OT at the skilled nursing facility. I will see the patient back in about 2 more months

## 2013-10-29 NOTE — Patient Instructions (Signed)
The patient's strength was 0/5 R shoulder, 1/5 R bicep and 0/5 R tricep and 3+/5 finger grip. Left 3-/5 shoulder, 3/5 bicep and 3- tricep and 4+/5 finger grip. RLE 1/5 HF,2-/5 KE, 0 ankle DF/PF2- toe flexion and LLE 3-/5 HF, 1/5 KE proximal and 2-/5 toe flexion, 0 ankle DF/PF 2 minus left hamstring Sensory testing he appears intact in the upper extremities although some communication deficits interferes with full participation. Decreased sensation to light touch both lower extremities  Non-ambulatory  Mental status alert smiling follows commands with cueing visually as well as interpretation from his daughter  No pain with shoulder elbow and wrist hip knee or ankle range of motion    Assessment & Plan:  1. 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 He made a trip to [place] and came back nonambulatory  The patient has gained some right knee extension since discharge from rehabilitation unit in the hospital. This is not enough to help with ambulation however. Recommend continued PT and OT at the skilled nursing facility. I will see the patient back in about 2 more months  RTC 2 mo

## 2013-10-29 NOTE — Telephone Encounter (Signed)
Called [facility] facility and spoke with Deneise Lever and she wanted to know what were that labs that was needed for the pt. I informed her that Dr. Erlinda Hong ordered an A1c, TSH and T4, free and that we did not get labs at the time of the OV. I advised Deneise Lever that if there were any other questions, problems or concerns to call the office. Deneise Lever verbalized understanding.

## 2013-10-29 NOTE — Telephone Encounter (Signed)
[facility] facility wants to know if labs were drawn here las week or if they need to draw them. Please call [phone].

## 2013-10-30 DIAGNOSIS — I6789 Other cerebrovascular disease: Secondary | ICD-10-CM | POA: Diagnosis not present

## 2013-10-30 DIAGNOSIS — F39 Unspecified mood [affective] disorder: Secondary | ICD-10-CM | POA: Diagnosis not present

## 2013-12-06 DIAGNOSIS — I69898 Other sequelae of other cerebrovascular disease: Secondary | ICD-10-CM | POA: Diagnosis not present

## 2013-12-06 DIAGNOSIS — M2550 Pain in unspecified joint: Secondary | ICD-10-CM | POA: Diagnosis not present

## 2013-12-06 DIAGNOSIS — G14 Postpolio syndrome: Secondary | ICD-10-CM | POA: Diagnosis not present

## 2013-12-06 DIAGNOSIS — B91 Sequelae of poliomyelitis: Secondary | ICD-10-CM | POA: Diagnosis not present

## 2013-12-06 DIAGNOSIS — M109 Gout, unspecified: Secondary | ICD-10-CM | POA: Diagnosis not present

## 2013-12-06 DIAGNOSIS — M2450 Contracture, unspecified joint: Secondary | ICD-10-CM | POA: Diagnosis not present

## 2013-12-06 DIAGNOSIS — E640 Sequelae of protein-calorie malnutrition: Secondary | ICD-10-CM | POA: Diagnosis not present

## 2013-12-06 DIAGNOSIS — Z5189 Encounter for other specified aftercare: Secondary | ICD-10-CM | POA: Diagnosis not present

## 2013-12-06 DIAGNOSIS — I69998 Other sequelae following unspecified cerebrovascular disease: Secondary | ICD-10-CM | POA: Diagnosis not present

## 2013-12-06 DIAGNOSIS — M179 Osteoarthritis of knee, unspecified: Secondary | ICD-10-CM | POA: Diagnosis not present

## 2013-12-06 DIAGNOSIS — M6250 Muscle wasting and atrophy, not elsewhere classified, unspecified site: Secondary | ICD-10-CM | POA: Diagnosis not present

## 2013-12-07 DIAGNOSIS — G14 Postpolio syndrome: Secondary | ICD-10-CM | POA: Diagnosis not present

## 2013-12-07 DIAGNOSIS — M2450 Contracture, unspecified joint: Secondary | ICD-10-CM | POA: Diagnosis not present

## 2013-12-07 DIAGNOSIS — M6250 Muscle wasting and atrophy, not elsewhere classified, unspecified site: Secondary | ICD-10-CM | POA: Diagnosis not present

## 2013-12-07 DIAGNOSIS — E640 Sequelae of protein-calorie malnutrition: Secondary | ICD-10-CM | POA: Diagnosis not present

## 2013-12-07 DIAGNOSIS — Z5189 Encounter for other specified aftercare: Secondary | ICD-10-CM | POA: Diagnosis not present

## 2013-12-07 DIAGNOSIS — I69898 Other sequelae of other cerebrovascular disease: Secondary | ICD-10-CM | POA: Diagnosis not present

## 2013-12-11 DIAGNOSIS — M6250 Muscle wasting and atrophy, not elsewhere classified, unspecified site: Secondary | ICD-10-CM | POA: Diagnosis not present

## 2013-12-11 DIAGNOSIS — M2450 Contracture, unspecified joint: Secondary | ICD-10-CM | POA: Diagnosis not present

## 2013-12-11 DIAGNOSIS — G14 Postpolio syndrome: Secondary | ICD-10-CM | POA: Diagnosis not present

## 2013-12-11 DIAGNOSIS — I69898 Other sequelae of other cerebrovascular disease: Secondary | ICD-10-CM | POA: Diagnosis not present

## 2013-12-11 DIAGNOSIS — Z5189 Encounter for other specified aftercare: Secondary | ICD-10-CM | POA: Diagnosis not present

## 2013-12-11 DIAGNOSIS — E640 Sequelae of protein-calorie malnutrition: Secondary | ICD-10-CM | POA: Diagnosis not present

## 2013-12-12 ENCOUNTER — Telehealth: Payer: Self-pay | Admitting: Neurology

## 2013-12-12 DIAGNOSIS — Z5189 Encounter for other specified aftercare: Secondary | ICD-10-CM | POA: Diagnosis not present

## 2013-12-12 DIAGNOSIS — I69898 Other sequelae of other cerebrovascular disease: Secondary | ICD-10-CM | POA: Diagnosis not present

## 2013-12-12 DIAGNOSIS — M6250 Muscle wasting and atrophy, not elsewhere classified, unspecified site: Secondary | ICD-10-CM | POA: Diagnosis not present

## 2013-12-12 DIAGNOSIS — E640 Sequelae of protein-calorie malnutrition: Secondary | ICD-10-CM | POA: Diagnosis not present

## 2013-12-12 DIAGNOSIS — M2450 Contracture, unspecified joint: Secondary | ICD-10-CM | POA: Diagnosis not present

## 2013-12-12 DIAGNOSIS — G14 Postpolio syndrome: Secondary | ICD-10-CM | POA: Diagnosis not present

## 2013-12-12 NOTE — Telephone Encounter (Signed)
Please be advised called kim back and gave verbal orders for home health aid , nursing and social work.. The patient needed these services I  gave verbal orders.

## 2013-12-12 NOTE — Telephone Encounter (Signed)
Kim from [facility] calling to request orders for home health aid, nursing, and social work for patient. Please call and advise.

## 2013-12-13 DIAGNOSIS — M2450 Contracture, unspecified joint: Secondary | ICD-10-CM | POA: Diagnosis not present

## 2013-12-13 DIAGNOSIS — Z5189 Encounter for other specified aftercare: Secondary | ICD-10-CM | POA: Diagnosis not present

## 2013-12-13 DIAGNOSIS — I69898 Other sequelae of other cerebrovascular disease: Secondary | ICD-10-CM | POA: Diagnosis not present

## 2013-12-13 DIAGNOSIS — M6250 Muscle wasting and atrophy, not elsewhere classified, unspecified site: Secondary | ICD-10-CM | POA: Diagnosis not present

## 2013-12-13 DIAGNOSIS — E640 Sequelae of protein-calorie malnutrition: Secondary | ICD-10-CM | POA: Diagnosis not present

## 2013-12-13 DIAGNOSIS — G14 Postpolio syndrome: Secondary | ICD-10-CM | POA: Diagnosis not present

## 2013-12-14 ENCOUNTER — Telehealth: Payer: Self-pay | Admitting: Neurology

## 2013-12-14 DIAGNOSIS — M2450 Contracture, unspecified joint: Secondary | ICD-10-CM | POA: Diagnosis not present

## 2013-12-14 DIAGNOSIS — E640 Sequelae of protein-calorie malnutrition: Secondary | ICD-10-CM | POA: Diagnosis not present

## 2013-12-14 DIAGNOSIS — I69898 Other sequelae of other cerebrovascular disease: Secondary | ICD-10-CM | POA: Diagnosis not present

## 2013-12-14 DIAGNOSIS — G14 Postpolio syndrome: Secondary | ICD-10-CM | POA: Diagnosis not present

## 2013-12-14 DIAGNOSIS — M6250 Muscle wasting and atrophy, not elsewhere classified, unspecified site: Secondary | ICD-10-CM | POA: Diagnosis not present

## 2013-12-14 DIAGNOSIS — Z5189 Encounter for other specified aftercare: Secondary | ICD-10-CM | POA: Diagnosis not present

## 2013-12-14 NOTE — Telephone Encounter (Signed)
After talking to the work in doctor he stated the patient needed to go to the urgent care and tuesday morning I will find them him a primary.

## 2013-12-14 NOTE — Telephone Encounter (Signed)
Heather from [facility] calling us because she is unable to find out who patient's PCP is, states that patient is not feeling well, he has a cough with thick green and yellow mucus, low back pain, Nira Conn is concerned it might be pneumonia. Please return call and advise.

## 2013-12-16 NOTE — Telephone Encounter (Signed)
Thank you. Let me know if you need me to do anything for this pt.   Rosalin Hawking, MD PhD Stroke Neurology 12/16/2013 10:30 AM

## 2013-12-17 ENCOUNTER — Encounter (HOSPITAL_COMMUNITY): Payer: Self-pay | Admitting: Emergency Medicine

## 2013-12-17 ENCOUNTER — Emergency Department (HOSPITAL_COMMUNITY): Payer: Medicare Other

## 2013-12-17 ENCOUNTER — Emergency Department (HOSPITAL_COMMUNITY)
Admission: EM | Admit: 2013-12-17 | Discharge: 2013-12-17 | Disposition: A | Discharge status: Home / Self Care | Payer: Medicare Other | Attending: Emergency Medicine | Admitting: Emergency Medicine

## 2013-12-17 DIAGNOSIS — F17200 Nicotine dependence, unspecified, uncomplicated: Secondary | ICD-10-CM | POA: Insufficient documentation

## 2013-12-17 DIAGNOSIS — I10 Essential (primary) hypertension: Secondary | ICD-10-CM | POA: Insufficient documentation

## 2013-12-17 DIAGNOSIS — Z791 Long term (current) use of non-steroidal anti-inflammatories (NSAID): Secondary | ICD-10-CM | POA: Insufficient documentation

## 2013-12-17 DIAGNOSIS — M25519 Pain in unspecified shoulder: Secondary | ICD-10-CM | POA: Diagnosis not present

## 2013-12-17 DIAGNOSIS — Z79899 Other long term (current) drug therapy: Secondary | ICD-10-CM | POA: Insufficient documentation

## 2013-12-17 DIAGNOSIS — M25511 Pain in right shoulder: Secondary | ICD-10-CM

## 2013-12-17 DIAGNOSIS — J3489 Other specified disorders of nose and nasal sinuses: Secondary | ICD-10-CM | POA: Insufficient documentation

## 2013-12-17 DIAGNOSIS — R0602 Shortness of breath: Secondary | ICD-10-CM | POA: Diagnosis not present

## 2013-12-17 DIAGNOSIS — M6250 Muscle wasting and atrophy, not elsewhere classified, unspecified site: Secondary | ICD-10-CM | POA: Diagnosis not present

## 2013-12-17 DIAGNOSIS — I69898 Other sequelae of other cerebrovascular disease: Secondary | ICD-10-CM | POA: Diagnosis not present

## 2013-12-17 DIAGNOSIS — R059 Cough, unspecified: Secondary | ICD-10-CM | POA: Insufficient documentation

## 2013-12-17 DIAGNOSIS — R05 Cough: Secondary | ICD-10-CM | POA: Diagnosis not present

## 2013-12-17 DIAGNOSIS — Z5189 Encounter for other specified aftercare: Secondary | ICD-10-CM | POA: Diagnosis not present

## 2013-12-17 DIAGNOSIS — M2450 Contracture, unspecified joint: Secondary | ICD-10-CM | POA: Diagnosis not present

## 2013-12-17 DIAGNOSIS — IMO0001 Reserved for inherently not codable concepts without codable children: Secondary | ICD-10-CM | POA: Diagnosis not present

## 2013-12-17 DIAGNOSIS — J984 Other disorders of lung: Secondary | ICD-10-CM | POA: Diagnosis not present

## 2013-12-17 DIAGNOSIS — G14 Postpolio syndrome: Secondary | ICD-10-CM | POA: Diagnosis not present

## 2013-12-17 DIAGNOSIS — Z8612 Personal history of poliomyelitis: Secondary | ICD-10-CM | POA: Diagnosis not present

## 2013-12-17 DIAGNOSIS — Z8673 Personal history of transient ischemic attack (TIA), and cerebral infarction without residual deficits: Secondary | ICD-10-CM | POA: Diagnosis not present

## 2013-12-17 DIAGNOSIS — E640 Sequelae of protein-calorie malnutrition: Secondary | ICD-10-CM | POA: Diagnosis not present

## 2013-12-17 LAB — CBC WITH DIFFERENTIAL/PLATELET
BASOS ABS: 0 10*3/uL (ref 0.0–0.1)
Basophils Relative: 0 % (ref 0–1)
Eosinophils Absolute: 0.5 10*3/uL (ref 0.0–0.7)
Eosinophils Relative: 5 % (ref 0–5)
HCT: 38 % — ABNORMAL LOW (ref 39.0–52.0)
HEMOGLOBIN: 12.7 g/dL — AB (ref 13.0–17.0)
LYMPHS PCT: 21 % (ref 12–46)
Lymphs Abs: 2 10*3/uL (ref 0.7–4.0)
MCH: 29.2 pg (ref 26.0–34.0)
MCHC: 33.4 g/dL (ref 30.0–36.0)
MCV: 87.4 fL (ref 78.0–100.0)
MONOS PCT: 8 % (ref 3–12)
Monocytes Absolute: 0.8 10*3/uL (ref 0.1–1.0)
NEUTROS ABS: 6.3 10*3/uL (ref 1.7–7.7)
Neutrophils Relative %: 66 % (ref 43–77)
Platelets: 224 10*3/uL (ref 150–400)
RBC: 4.35 MIL/uL (ref 4.22–5.81)
RDW: 13.8 % (ref 11.5–15.5)
WBC: 9.5 10*3/uL (ref 4.0–10.5)

## 2013-12-17 LAB — COMPREHENSIVE METABOLIC PANEL
ALK PHOS: 72 U/L (ref 39–117)
ALT: 16 U/L (ref 0–53)
AST: 21 U/L (ref 0–37)
Albumin: 3.3 g/dL — ABNORMAL LOW (ref 3.5–5.2)
Anion gap: 13 (ref 5–15)
BILIRUBIN TOTAL: 0.4 mg/dL (ref 0.3–1.2)
BUN: 18 mg/dL (ref 6–23)
CHLORIDE: 102 meq/L (ref 96–112)
CO2: 26 meq/L (ref 19–32)
CREATININE: 0.57 mg/dL (ref 0.50–1.35)
Calcium: 9.5 mg/dL (ref 8.4–10.5)
GFR calc Af Amer: >90 mL/min (ref >90)
Glucose, Bld: 112 mg/dL — ABNORMAL HIGH (ref 70–99)
POTASSIUM: 4.4 meq/L (ref 3.7–5.3)
Sodium: 141 mEq/L (ref 137–147)
Total Protein: 7.4 g/dL (ref 6.0–8.3)

## 2013-12-17 LAB — URINALYSIS, ROUTINE W REFLEX MICROSCOPIC
Bilirubin Urine: NEGATIVE
Glucose, UA: NEGATIVE mg/dL
Hgb urine dipstick: NEGATIVE
Ketones, ur: NEGATIVE mg/dL
Leukocytes, UA: NEGATIVE
NITRITE: NEGATIVE
PROTEIN: NEGATIVE mg/dL
Specific Gravity, Urine: 1.01 (ref 1.005–1.030)
UROBILINOGEN UA: 1 mg/dL (ref 0.0–1.0)
pH: 7 (ref 5.0–8.0)

## 2013-12-17 LAB — I-STAT CG4 LACTIC ACID, ED: LACTIC ACID, VENOUS: 1.64 mmol/L (ref 0.5–2.2)

## 2013-12-17 MED ORDER — ONDANSETRON HCL 4 MG PO TABS: 4.0000 mg | ORAL_TABLET | Freq: Four times a day (QID) | ORAL | Status: DC

## 2013-12-17 MED ORDER — MORPHINE SULFATE 4 MG/ML IJ SOLN
4.0000 mg | Freq: Once | INTRAMUSCULAR | Status: AC
  Administered 2013-12-17: 4 mg via INTRAVENOUS
  Filled 2013-12-17: qty 1

## 2013-12-17 MED ORDER — HYDROCODONE-ACETAMINOPHEN 5-325 MG PO TABS: 1.0000 | ORAL_TABLET | Freq: Four times a day (QID) | ORAL | Status: DC | PRN

## 2013-12-17 MED ORDER — ONDANSETRON HCL 4 MG/2ML IJ SOLN
4.0000 mg | Freq: Once | INTRAMUSCULAR | Status: AC
  Administered 2013-12-17: 4 mg via INTRAVENOUS
  Filled 2013-12-17: qty 2

## 2013-12-17 NOTE — ED Provider Notes (Signed)
CSN: [id number]     Arrival date & time 12/17/13  1051 History   First MD Initiated Contact with Patient 12/17/13 1058     Chief Complaint  Patient presents with  . congestion      (Consider location/radiation/quality/duration/timing/severity/associated sxs/prior Treatment) HPI Comments: Patient is a 63 year old male with history of hypertension, polio, stroke who presents to the emergency department for evaluation of cough. He reports that he generally feels well. His family reports that his cough has been gradually worsening and he was instructed by his home speech therapist to come in for evaluation of aspiration pneumonia. He also complains of right shoulder pain. His wife changes his diapers and has been noticing more pain when she needs to turn him. No injury to the area. No fevers, chills, nausea, vomiting, abdominal pain, diarrhea. His urine has a foul odor.  The history is provided by the patient. The history is limited by a language barrier. A language interpreter was used Designer, multimedia).    Past Medical History  Diagnosis Date  . Hypertension   . Polio   . Stroke    History reviewed. No pertinent past surgical history. Family History  Problem Relation Age of Onset  . Hypertension Mother   . Hypertension Father    History  Substance Use Topics  . Smoking status: Current Every Day Smoker    Types: Cigarettes  . Smokeless tobacco: Not on file  . Alcohol Use: No    Review of Systems  Constitutional: Negative for fever and chills.  HENT: Positive for congestion.   Respiratory: Positive for cough. Negative for shortness of breath.   Cardiovascular: Negative for chest pain.  Gastrointestinal: Negative for nausea, vomiting and abdominal pain.  Musculoskeletal: Positive for arthralgias and myalgias.  All other systems reviewed and are negative.     Allergies  Review of patient's allergies indicates no known allergies.  Home Medications   Prior to Admission  medications   Medication Sig Start Date End Date Taking? Authorizing Provider  amLODipine (NORVASC) 10 MG tablet Take 1 tablet (10 mg total) by mouth daily. 09/13/13  Yes Ivan Anchors Love, PA-C  atorvastatin (LIPITOR) 10 MG tablet Take 1 tablet (10 mg total) by mouth daily. 10/25/13  Yes Rosalin Hawking, MD  cloNIDine (CATAPRES) 0.3 MG tablet Take 0.3 mg by mouth at bedtime.   Yes Historical Provider, MD  diclofenac sodium (VOLTAREN) 1 % GEL Apply 2 g topically 4 (four) times daily. 09/13/13  Yes Ivan Anchors Love, PA-C  feeding supplement, ENSURE COMPLETE, (ENSURE COMPLETE) LIQD Take 237 mLs by mouth 2 (two) times daily between meals. 09/13/13  Yes Ivan Anchors Love, PA-C  lisinopril (PRINIVIL,ZESTRIL) 40 MG tablet Take 1 tablet (40 mg total) by mouth daily. 09/13/13  Yes Ivan Anchors Love, PA-C  mirtazapine (REMERON) 7.5 MG tablet Take 1 tablet (7.5 mg total) by mouth at bedtime. 09/13/13  Yes Ivan Anchors Love, PA-C  pantoprazole (PROTONIX) 40 MG tablet Take 1 tablet (40 mg total) by mouth at bedtime. 09/13/13  Yes Ivan Anchors Love, PA-C  sertraline (ZOLOFT) 100 MG tablet Take 100 mg by mouth daily.   Yes Historical Provider, MD  traMADol (ULTRAM) 50 MG tablet Take 1 tablet (50 mg total) by mouth every 6 (six) hours as needed (pain). 09/13/13  Yes Pamela S Love, PA-C   BP 152/88  Pulse 77  Temp(Src) 98.4 F (36.9 C) (Oral)  Resp 20  SpO2 99% Physical Exam  Nursing note and vitals reviewed. Constitutional: He is oriented  to person, place, and time. He appears well-developed and well-nourished. No distress.  HENT:  Head: Normocephalic and atraumatic.  Right Ear: External ear normal.  Left Ear: External ear normal.  Nose: Nose normal.  Eyes: Conjunctivae and EOM are normal. Pupils are equal, round, and reactive to light.  Neck: Normal range of motion. No tracheal deviation present.  No nuchal rigidity or meningeal signs  Cardiovascular: Normal rate, regular rhythm, normal heart sounds, intact distal pulses and normal pulses.    Pulses:      Radial pulses are 2+ on the right side, and 2+ on the left side.       Posterior tibial pulses are 2+ on the right side, and 2+ on the left side.  Pulmonary/Chest: Effort normal and breath sounds normal. No stridor.  Abdominal: Soft. He exhibits no distension. There is no tenderness.  Musculoskeletal: Normal range of motion.  Tender to palpation diffusely over right shoulder.   Neurological: He is alert and oriented to person, place, and time. He displays atrophy. GCS eye subscore is 4. GCS verbal subscore is 5. GCS motor subscore is 6.  Atrophy to lower extremities bilaterally   Skin: Skin is warm and dry. No rash noted. He is not diaphoretic.  Psychiatric: He has a normal mood and affect. His behavior is normal.    ED Course  Procedures (including critical care time) Labs Review Labs Reviewed  CBC WITH DIFFERENTIAL - Abnormal; Notable for the following:    Hemoglobin 12.7 (*)    HCT 38.0 (*)    All other components within normal limits  COMPREHENSIVE METABOLIC PANEL - Abnormal; Notable for the following:    Glucose, Bld 112 (*)    Albumin 3.3 (*)    All other components within normal limits  URINE CULTURE  URINALYSIS, ROUTINE W REFLEX MICROSCOPIC  I-STAT CG4 LACTIC ACID, ED    Imaging Review Dg Chest 2 View  12/17/2013   CLINICAL DATA:  Question aspiration pneumonia.  Right shoulder pain.  EXAM: CHEST  2 VIEW  COMPARISON:  08/29/2013.  FINDINGS: Trachea is midline. Heart size stable. Slight added density in the right perihilar region may be due to superimposition of osseous and vascular structures. Lungs are low in volume but otherwise clear. Right apical pleural thickening. No pleural fluid.  Pectus deformity.  Degenerative changes in the left shoulder.  IMPRESSION: 1. Small area of added density in the right perihilar region may be due to superimposition of osseous and vascular structures. Followup PA and lateral views of the chest are recommended. 2. No evidence of  aspiration.   Electronically Signed   By: Lorin Picket M.D.   On: 12/17/2013 12:33   Dg Shoulder Right  12/17/2013   CLINICAL DATA:  Right shoulder pain  EXAM: RIGHT SHOULDER - 2+ VIEW  COMPARISON:  12/17/2013 chest radiograph  FINDINGS: On all projections the humeral head is in prominent internal rotation. There is considerable spurring of the humeral head. Although most of the humeral head faces posterior and away from the glenoid, I do not observe compelling evidence of dislocation or fracture. No axillary view was obtained.  IMPRESSION: 1. No acute bony findings. Degenerative spurring of the humeral head. Mildly reduced sensitivity in assessing the humerus due to the prominent degree of internal rotation.   Electronically Signed   By: Sherryl Barters M.D.   On: 12/17/2013 12:35     EKG Interpretation None      MDM   Final diagnoses:  Right shoulder pain  Cough    Patient presents emergency department for evaluation of gradually worsening cough. On exam patient reports that he feels very well. Labs and imaging are unremarkable. Patient will follow up with PCP. Dr. Wilson Singer evaluated patient and agrees to plan for discharge home. Discussed with patient and family reasons to return to ED immediately. Vital signs stable for discharge. Patient / Family / Caregiver informed of clinical course, understand medical decision-making process, and agree with plan.    Elwyn Lade, PA-C 12/21/13 [phone]

## 2013-12-17 NOTE — ED Notes (Signed)
MD at bedside. 

## 2013-12-17 NOTE — Discharge Instructions (Signed)
?au Vai ( Shoulder Pain) Vai l kh?p k?t n?i cc cnh tay v?i c? th? c?a qu v?. Cc x??ng t?o thnh kh?p vai bao g?m x??ng cnh tay trn (x??ng cnh tay), x??ng b? vai (x??ng vai) v x??ng ?n (x??ng Lorrine Kin). ??u x??ng cnh tay c hnh d?ng nh? qu? bng v kh?p vo m?t h?c t??ng ??i b?ng ph?ng trn x??ng b? vai (? ch?o). S? k?t h?p c?a cc c? v cc m x? m?nh m? k?t n?i c? v?i x??ng (gn) ?? kh?p vai v gi? ph?n l?i c?u ? trong h?c. Cc ti nh? ch?a ??y d?ch (ti ho?t d?ch) ???c ??t t?i cc vng khc nhau c?a kh?p. Chng c tc d?ng nh? cc t?m ??m gi?a cc x??ng v m m?m ph? ln v gip lm gi?m ma st gi?a cc gn tr??t v x??ng khi qu v? c? ??ng cnh tay. Kh?p vai cho php ph?m vi c? ??ng c?a cnh tay r?ng. Ph?m vi c? ??ng ny cho php qu v? lm nh?ng vi?c nh? gi l?ng ho?c nm bng. Tuy nhin, ph?m vi c? ??ng ny c?ng lm cho vai d? b? ?au do vi?c s? d?ng qu m?c v ch?n th??ng. Nguyn nhn gy ?au vai c th? b?t ngu?n t? ch?n th??ng c?ng nh? s? d?ng qu m?c v th??ng c th? ???c chia thnh b?n lo?i sau ?y:  T?y ??, s?ng n? v ?au (vim) gn (vimgn)ho?c cc ti ho?t d?ch (vim ti ho?t d?ch).  B?t ?n ??nh, ch?ng h?n tr?t kh?p.  Vim ? kh?p (vim kh?p).  Gy x??ng (gy x??ng). H??NG D?N CH?M [place] T?I NH   Ch??m ? vo ch? ?au.  Cho ? l?nh vo ti nh?a.  ?? kh?n t?m vo gi?a da v ti.  ?? ? trong kho?ng 15-20 pht, 3-4 l?n m?i ngy trong 2 ngy ??u tin, ho?c theo ch? d?n c?a chuyn gia ch?m [place] s?c kh?e c?a qu v?.  Ng?ng s? d?ng ti ch??m l?nh n?u chng khng gip gi?m ?au.  N?u qu v? s? d?ng b?ng ?eo vai ho?c d?ng c? c? ??nh, hy s? d?ng n trong kho?ng th?i gian theo h??ng d?n c?a chuyn gia ch?m [place] y t?. Ch? tho ra ?? t?m. C? ??ng cnh tay cng t cng t?t, nh?ng lun c? ??ng bn tay c?a qu v? ?? trnh s?ng n?.  Bp qu? bng m?m ho?c t?m b?t bi?n cng nhi?u cng t?t ?? gip ng?n ch?n s?ng n?.  Ch? s? d?ng thu?c khng c?n k ??n ho?c thu?c c?n k ??n ?? gi?m ?au,  gi?m c?m gic kh ch?u ho?c h? s?t theo ch? d?n c?a chuyn gia ch?m [place] y t? c?a qu v?. ?I K[facility] N?U:   ?au vai gia t?ng vai ho?c c?n ?au m?i pht tri?n ? cnh tay, bn tay ho?c ngn tay c?a qu v?.  Bn tay ho?c ngn tay c?a qu v? tr? nn l?nh v t.  Qu v? khng h?t ?au sau khi dng thu?c. NGAY L?P T?C ?I K[facility] N?U:   Cnh tay, bn tay ho?c ngn tay c?a qu v? b? t ho?c ?au bu?t.  Cnh tay, bn tay ho?c ngn tay c?a qu v? s?ng ln ?ng k? ho?c chuy?n sang mu tr?ng ho?c xanh. ??M B?O QU V?:   Hi?u r cc h??ng d?n ny.  S? theo di tnh tr?ng c?a mnh.  S? yu c?u tr? gip ngay l?p t?c n?u qu v? c?m th?y khng kh?e ho?c th?y tr?m tr?ng h?n. Document Released: 09/28/2011 Document  Revised: 08/13/2013 [employer] Patient Information 2015 [place], [place]. This information is not intended to replace advice given to you by your health care provider. Make sure you discuss any questions you have with your health care provider.

## 2013-12-17 NOTE — ED Notes (Signed)
Bed: ZP68 Expected date:  Expected time:  Means of arrival:  Comments: EMS- congestion, bedridden

## 2013-12-17 NOTE — ED Notes (Signed)
Per EMS: pt c/o for SOB, NAD, vitals WNL, respirations normal. Pt has foul smelling urine smell.

## 2013-12-18 DIAGNOSIS — I69898 Other sequelae of other cerebrovascular disease: Secondary | ICD-10-CM | POA: Diagnosis not present

## 2013-12-18 DIAGNOSIS — M6250 Muscle wasting and atrophy, not elsewhere classified, unspecified site: Secondary | ICD-10-CM | POA: Diagnosis not present

## 2013-12-18 DIAGNOSIS — M2450 Contracture, unspecified joint: Secondary | ICD-10-CM | POA: Diagnosis not present

## 2013-12-18 DIAGNOSIS — E640 Sequelae of protein-calorie malnutrition: Secondary | ICD-10-CM | POA: Diagnosis not present

## 2013-12-18 DIAGNOSIS — Z5189 Encounter for other specified aftercare: Secondary | ICD-10-CM | POA: Diagnosis not present

## 2013-12-18 DIAGNOSIS — G14 Postpolio syndrome: Secondary | ICD-10-CM | POA: Diagnosis not present

## 2013-12-18 NOTE — Telephone Encounter (Signed)
I have called the all the patient numbers and i can not get in touch with him. He needs to call Social service regarding a primary because he has a set place on his card. [facility] nurse was notified and she also will let the patient know.

## 2013-12-19 DIAGNOSIS — G14 Postpolio syndrome: Secondary | ICD-10-CM | POA: Diagnosis not present

## 2013-12-19 DIAGNOSIS — M2450 Contracture, unspecified joint: Secondary | ICD-10-CM | POA: Diagnosis not present

## 2013-12-19 DIAGNOSIS — M6250 Muscle wasting and atrophy, not elsewhere classified, unspecified site: Secondary | ICD-10-CM | POA: Diagnosis not present

## 2013-12-19 DIAGNOSIS — Z5189 Encounter for other specified aftercare: Secondary | ICD-10-CM | POA: Diagnosis not present

## 2013-12-19 DIAGNOSIS — I69898 Other sequelae of other cerebrovascular disease: Secondary | ICD-10-CM | POA: Diagnosis not present

## 2013-12-19 DIAGNOSIS — E640 Sequelae of protein-calorie malnutrition: Secondary | ICD-10-CM | POA: Diagnosis not present

## 2013-12-19 LAB — URINE CULTURE
Colony Count: NO GROWTH
Culture: NO GROWTH

## 2013-12-20 DIAGNOSIS — M6250 Muscle wasting and atrophy, not elsewhere classified, unspecified site: Secondary | ICD-10-CM | POA: Diagnosis not present

## 2013-12-20 DIAGNOSIS — E640 Sequelae of protein-calorie malnutrition: Secondary | ICD-10-CM | POA: Diagnosis not present

## 2013-12-20 DIAGNOSIS — Z5189 Encounter for other specified aftercare: Secondary | ICD-10-CM | POA: Diagnosis not present

## 2013-12-20 DIAGNOSIS — M2450 Contracture, unspecified joint: Secondary | ICD-10-CM | POA: Diagnosis not present

## 2013-12-20 DIAGNOSIS — G14 Postpolio syndrome: Secondary | ICD-10-CM | POA: Diagnosis not present

## 2013-12-20 DIAGNOSIS — I69898 Other sequelae of other cerebrovascular disease: Secondary | ICD-10-CM | POA: Diagnosis not present

## 2013-12-22 DIAGNOSIS — E640 Sequelae of protein-calorie malnutrition: Secondary | ICD-10-CM | POA: Diagnosis not present

## 2013-12-22 DIAGNOSIS — M2450 Contracture, unspecified joint: Secondary | ICD-10-CM | POA: Diagnosis not present

## 2013-12-22 DIAGNOSIS — I69898 Other sequelae of other cerebrovascular disease: Secondary | ICD-10-CM | POA: Diagnosis not present

## 2013-12-22 DIAGNOSIS — Z5189 Encounter for other specified aftercare: Secondary | ICD-10-CM | POA: Diagnosis not present

## 2013-12-22 DIAGNOSIS — M6250 Muscle wasting and atrophy, not elsewhere classified, unspecified site: Secondary | ICD-10-CM | POA: Diagnosis not present

## 2013-12-22 DIAGNOSIS — G14 Postpolio syndrome: Secondary | ICD-10-CM | POA: Diagnosis not present

## 2013-12-22 NOTE — ED Provider Notes (Signed)
Medical screening examination/treatment/procedure(s) were performed by non-physician practitioner and as supervising physician I was immediately available for consultation/collaboration.   EKG Interpretation None       Virgel Manifold, MD 12/22/13 [phone]

## 2013-12-24 ENCOUNTER — Emergency Department (HOSPITAL_COMMUNITY)
Admission: EM | Admit: 2013-12-24 | Discharge: 2013-12-24 | Disposition: A | Discharge status: Home / Self Care | Payer: Medicare Other | Attending: Emergency Medicine | Admitting: Emergency Medicine

## 2013-12-24 ENCOUNTER — Encounter (HOSPITAL_COMMUNITY): Payer: Self-pay | Admitting: Emergency Medicine

## 2013-12-24 ENCOUNTER — Telehealth: Payer: Self-pay | Admitting: Neurology

## 2013-12-24 DIAGNOSIS — Z79899 Other long term (current) drug therapy: Secondary | ICD-10-CM | POA: Diagnosis not present

## 2013-12-24 DIAGNOSIS — Z8619 Personal history of other infectious and parasitic diseases: Secondary | ICD-10-CM | POA: Diagnosis not present

## 2013-12-24 DIAGNOSIS — G14 Postpolio syndrome: Secondary | ICD-10-CM | POA: Diagnosis not present

## 2013-12-24 DIAGNOSIS — I10 Essential (primary) hypertension: Secondary | ICD-10-CM

## 2013-12-24 DIAGNOSIS — F17200 Nicotine dependence, unspecified, uncomplicated: Secondary | ICD-10-CM | POA: Insufficient documentation

## 2013-12-24 DIAGNOSIS — Z791 Long term (current) use of non-steroidal anti-inflammatories (NSAID): Secondary | ICD-10-CM | POA: Insufficient documentation

## 2013-12-24 DIAGNOSIS — E640 Sequelae of protein-calorie malnutrition: Secondary | ICD-10-CM | POA: Diagnosis not present

## 2013-12-24 DIAGNOSIS — M2450 Contracture, unspecified joint: Secondary | ICD-10-CM | POA: Diagnosis not present

## 2013-12-24 DIAGNOSIS — Z8673 Personal history of transient ischemic attack (TIA), and cerebral infarction without residual deficits: Secondary | ICD-10-CM | POA: Diagnosis not present

## 2013-12-24 DIAGNOSIS — Z5189 Encounter for other specified aftercare: Secondary | ICD-10-CM | POA: Diagnosis not present

## 2013-12-24 DIAGNOSIS — I69898 Other sequelae of other cerebrovascular disease: Secondary | ICD-10-CM | POA: Diagnosis not present

## 2013-12-24 DIAGNOSIS — M6250 Muscle wasting and atrophy, not elsewhere classified, unspecified site: Secondary | ICD-10-CM | POA: Diagnosis not present

## 2013-12-24 LAB — BASIC METABOLIC PANEL
ANION GAP: 16 — AB (ref 5–15)
BUN: 13 mg/dL (ref 6–23)
CHLORIDE: 103 meq/L (ref 96–112)
CO2: 23 mEq/L (ref 19–32)
Calcium: 9.6 mg/dL (ref 8.4–10.5)
Creatinine, Ser: 0.5 mg/dL (ref 0.50–1.35)
Glucose, Bld: 89 mg/dL (ref 70–99)
Potassium: 3.9 mEq/L (ref 3.7–5.3)
Sodium: 142 mEq/L (ref 137–147)

## 2013-12-24 LAB — CBC
HCT: 42.8 % (ref 39.0–52.0)
Hemoglobin: 14.4 g/dL (ref 13.0–17.0)
MCH: 28.6 pg (ref 26.0–34.0)
MCHC: 33.6 g/dL (ref 30.0–36.0)
MCV: 85.1 fL (ref 78.0–100.0)
Platelets: 297 10*3/uL (ref 150–400)
RBC: 5.03 MIL/uL (ref 4.22–5.81)
RDW: 13.7 % (ref 11.5–15.5)
WBC: 8.2 10*3/uL (ref 4.0–10.5)

## 2013-12-24 MED ORDER — CLONIDINE HCL 0.1 MG PO TABS: 0.1000 mg | ORAL_TABLET | Freq: Two times a day (BID) | ORAL | Status: DC

## 2013-12-24 MED ORDER — AMLODIPINE BESYLATE 5 MG PO TABS
10.0000 mg | ORAL_TABLET | Freq: Every day | ORAL | Status: DC
  Administered 2013-12-24: 10 mg via ORAL
  Filled 2013-12-24: qty 2

## 2013-12-24 MED ORDER — LISINOPRIL 10 MG PO TABS: 10.0000 mg | ORAL_TABLET | Freq: Every day | ORAL | Status: DC

## 2013-12-24 MED ORDER — CLONIDINE HCL 0.2 MG PO TABS
0.3000 mg | ORAL_TABLET | Freq: Three times a day (TID) | ORAL | Status: DC | PRN
  Administered 2013-12-24: 0.3 mg via ORAL
  Filled 2013-12-24 (×2): qty 1

## 2013-12-24 MED ORDER — AMLODIPINE BESYLATE 10 MG PO TABS: 10.0000 mg | ORAL_TABLET | Freq: Every day | ORAL | Status: DC

## 2013-12-24 NOTE — Discharge Instructions (Signed)
Please call your doctor for a followup appointment within 24-48 hours. When you talk to your doctor please let them know that you were seen in the emergency department and have them acquire all of your records so that they can discuss the findings with you and formulate a treatment plan to fully care for your new and ongoing problems.  Take the following:  Lisinopril:  One pill in the morning daily Clonidine:  One pill in morning and one at night Norvasc:  One pill in the morning daily

## 2013-12-24 NOTE — ED Provider Notes (Signed)
CSN: [id number]     Arrival date & time 12/24/13  1742 History   None    Chief Complaint  Patient presents with  . Hypertension     (Consider location/radiation/quality/duration/timing/severity/associated sxs/prior Treatment) HPI Comments: The patient is a 63 year old [place] male with a recent hemorrhagic stroke which left him with significant disability. He presents to the hospital with severe hypertension. His occupational therapist has been coming to the house and has noted a gradual increase in his blood pressure which today culminated with a systolic blood pressure of over 242 systolic. The patient denies any complaints. He states that he takes one blood pressure medication in the morning, the paramedics state that this is lisinopril. According to his last discharge summary the patient was also supposed to be taking Norvasc, hydrochlorothiazide and possibly clonidine. The patient denies chest pain, shortness of breath, headache, blurred vision or any new or concerning complaints.  Patient is a 63 y.o. male presenting with hypertension. The history is provided by the patient.  Hypertension    Past Medical History  Diagnosis Date  . Hypertension   . Polio   . Stroke    History reviewed. No pertinent past surgical history. Family History  Problem Relation Age of Onset  . Hypertension Mother   . Hypertension Father    History  Substance Use Topics  . Smoking status: Current Every Day Smoker    Types: Cigarettes  . Smokeless tobacco: Not on file  . Alcohol Use: No    Review of Systems  All other systems reviewed and are negative.     Allergies  Review of patient's allergies indicates no known allergies.  Home Medications   Prior to Admission medications   Medication Sig Start Date End Date Taking? Authorizing Provider  amLODipine (NORVASC) 10 MG tablet Take 1 tablet (10 mg total) by mouth daily. 09/13/13  Yes Ivan Anchors Love, PA-C  atorvastatin (LIPITOR) 10 MG  tablet Take 1 tablet (10 mg total) by mouth daily. 10/25/13  Yes Rosalin Hawking, MD  cloNIDine (CATAPRES) 0.3 MG tablet Take 0.3 mg by mouth at bedtime.   Yes Historical Provider, MD  HYDROcodone-acetaminophen (NORCO/VICODIN) 5-325 MG per tablet Take 1 tablet by mouth every 6 (six) hours as needed for moderate pain or severe pain. 12/17/13  Yes Elwyn Lade, PA-C  HYDROcodone-acetaminophen (NORCO/VICODIN) 5-325 MG per tablet Take 1 tablet by mouth every 6 (six) hours as needed for moderate pain.   Yes Historical Provider, MD  lisinopril (PRINIVIL,ZESTRIL) 40 MG tablet Take 1 tablet (40 mg total) by mouth daily. 09/13/13  Yes Ivan Anchors Love, PA-C  mirtazapine (REMERON) 30 MG tablet Take 30 mg by mouth at bedtime.   Yes Historical Provider, MD  naproxen (NAPROSYN) 500 MG tablet Take 500 mg by mouth 2 (two) times daily with a meal.   Yes Historical Provider, MD  ondansetron (ZOFRAN) 4 MG tablet Take 4 mg by mouth every 8 (eight) hours as needed for nausea or vomiting.   Yes Historical Provider, MD  pantoprazole (PROTONIX) 40 MG tablet Take 1 tablet (40 mg total) by mouth at bedtime. 09/13/13  Yes Ivan Anchors Love, PA-C  sertraline (ZOLOFT) 100 MG tablet Take 100 mg by mouth daily.   Yes Historical Provider, MD  traMADol (ULTRAM) 50 MG tablet Take 1 tablet (50 mg total) by mouth every 6 (six) hours as needed (pain). 09/13/13  Yes Ivan Anchors Love, PA-C  amLODipine (NORVASC) 10 MG tablet Take 1 tablet (10 mg total) by mouth daily.  12/24/13   Johnna Acosta, MD  cloNIDine (CATAPRES) 0.1 MG tablet Take 1 tablet (0.1 mg total) by mouth 2 (two) times daily. 12/24/13   Johnna Acosta, MD  lisinopril (PRINIVIL,ZESTRIL) 10 MG tablet Take 1 tablet (10 mg total) by mouth daily. 12/24/13   Johnna Acosta, MD   BP 172/99  Pulse 61  Temp(Src) 98.4 F (36.9 C) (Oral)  Resp 19  SpO2 98% Physical Exam  Nursing note and vitals reviewed. Constitutional: He appears well-developed and well-nourished. No distress.  HENT:  Head:  Normocephalic and atraumatic.  Mouth/Throat: Oropharynx is clear and moist. No oropharyngeal exudate.  Eyes: Conjunctivae and EOM are normal. Pupils are equal, round, and reactive to light. Right eye exhibits no discharge. Left eye exhibits no discharge. No scleral icterus.  Neck: Normal range of motion. Neck supple. No JVD present. No thyromegaly present.  Cardiovascular: Normal rate, regular rhythm, normal heart sounds and intact distal pulses.  Exam reveals no gallop and no friction rub.   No murmur heard. Pulmonary/Chest: Effort normal and breath sounds normal. No respiratory distress. He has no wheezes. He has no rales.  Abdominal: Soft. Bowel sounds are normal. He exhibits no distension and no mass. There is no tenderness.  Musculoskeletal: Normal range of motion. He exhibits no edema and no tenderness.  Lymphadenopathy:    He has no cervical adenopathy.  Neurological: He is alert. Coordination normal.  The patient is able to speak at baseline per [place] interpreter, moves all extremities x4 at baseline according to the patient. He does require some upper extremity work to lift his lower extremities bilaterally. He does have some flexion contractures of the bilateral upper extremities with some involuntary tremor. Cranial nerves III through XII appear to be intact  Skin: Skin is warm and dry. No rash noted. No erythema.  Psychiatric: He has a normal mood and affect. His behavior is normal.    ED Course  Procedures (including critical care time) Labs Review Labs Reviewed  BASIC METABOLIC PANEL - Abnormal; Notable for the following:    Anion gap 16 (*)    All other components within normal limits  CBC    Imaging Review No results found.   EKG Interpretation   Date/Time:  Monday December 24 2013 17:44:20 EDT Ventricular Rate:  59 PR Interval:  157 QRS Duration: 113 QT Interval:  439 QTC Calculation: 435 R Axis:   18 Text Interpretation:  Sinus rhythm Probable left  atrial enlargement LVH  with secondary repolarization abnormality Abnormal ekg since last tracing  no significant change Confirmed by [place] Sorn  MD, Timira Bieda ([id number]) on 12/24/2013  5:47:11 PM      MDM   Final diagnoses:  Essential hypertension    The patient has no acute findings on exam, blood pressure 224/95, EKG with no significant tracing changes, blood pressure significantly elevated. We'll give home dose of medication and reevaluate.  Filed Vitals:   12/24/13 1950 12/24/13 2000 12/24/13 2015 12/24/13 2022  BP: 187/101 171/112 183/101 172/99  Pulse: 61 60 61   Temp:      TempSrc:      Resp: 13 25 19    SpO2: 100% 100% 98%    imprioved after meds - d/w family the proper med routine - they arrived with all his medicines - empty and were under the impression that this was all he had to take.  Education given.  Meds given in ED:  Medications  cloNIDine (CATAPRES) tablet 0.3 mg (0.3 mg Oral Given  12/24/13 1834)  amLODipine (NORVASC) tablet 10 mg (10 mg Oral Given 12/24/13 1834)    New Prescriptions   AMLODIPINE (NORVASC) 10 MG TABLET    Take 1 tablet (10 mg total) by mouth daily.   CLONIDINE (CATAPRES) 0.1 MG TABLET    Take 1 tablet (0.1 mg total) by mouth 2 (two) times daily.   LISINOPRIL (PRINIVIL,ZESTRIL) 10 MG TABLET    Take 1 tablet (10 mg total) by mouth daily.      Johnna Acosta, MD 12/24/13 [phone]

## 2013-12-24 NOTE — ED Notes (Addendum)
Pt here with HTN. 12 lead questionable. BP 230/100. Pt no complaints related to the HTN. Denies pain. Pt vietnamese. Pt bed bound

## 2013-12-24 NOTE — Telephone Encounter (Signed)
Ebony Hail, speech therapist from [place] calling to get verbal orders for speech therapy swallow education for patient, please return call and advise.

## 2013-12-25 NOTE — Telephone Encounter (Signed)
Kim from [facility] from [place] calling to request verbal orders to request continuing occupational therapy 2 times a week for 3 weeks, and also that patient has trouble with elevated blood pressure. Please return call and advise.

## 2013-12-26 DIAGNOSIS — M6250 Muscle wasting and atrophy, not elsewhere classified, unspecified site: Secondary | ICD-10-CM | POA: Diagnosis not present

## 2013-12-26 DIAGNOSIS — G14 Postpolio syndrome: Secondary | ICD-10-CM | POA: Diagnosis not present

## 2013-12-26 DIAGNOSIS — Z5189 Encounter for other specified aftercare: Secondary | ICD-10-CM | POA: Diagnosis not present

## 2013-12-26 DIAGNOSIS — I69898 Other sequelae of other cerebrovascular disease: Secondary | ICD-10-CM | POA: Diagnosis not present

## 2013-12-26 DIAGNOSIS — M2450 Contracture, unspecified joint: Secondary | ICD-10-CM | POA: Diagnosis not present

## 2013-12-26 DIAGNOSIS — E640 Sequelae of protein-calorie malnutrition: Secondary | ICD-10-CM | POA: Diagnosis not present

## 2013-12-26 NOTE — Telephone Encounter (Signed)
[facility]ed to [facility] regarding the patients ot st I gave verbal orders and told them the patient needs to follow up with his primary for blood pressure.

## 2013-12-27 DIAGNOSIS — E640 Sequelae of protein-calorie malnutrition: Secondary | ICD-10-CM | POA: Diagnosis not present

## 2013-12-27 DIAGNOSIS — G14 Postpolio syndrome: Secondary | ICD-10-CM | POA: Diagnosis not present

## 2013-12-27 DIAGNOSIS — M2450 Contracture, unspecified joint: Secondary | ICD-10-CM | POA: Diagnosis not present

## 2013-12-27 DIAGNOSIS — Z5189 Encounter for other specified aftercare: Secondary | ICD-10-CM | POA: Diagnosis not present

## 2013-12-27 DIAGNOSIS — M6250 Muscle wasting and atrophy, not elsewhere classified, unspecified site: Secondary | ICD-10-CM | POA: Diagnosis not present

## 2013-12-27 DIAGNOSIS — I69898 Other sequelae of other cerebrovascular disease: Secondary | ICD-10-CM | POA: Diagnosis not present

## 2013-12-31 ENCOUNTER — Encounter: Payer: Self-pay | Admitting: Physical Medicine & Rehabilitation

## 2013-12-31 ENCOUNTER — Ambulatory Visit (HOSPITAL_BASED_OUTPATIENT_CLINIC_OR_DEPARTMENT_OTHER): Payer: Medicare Other | Admitting: Physical Medicine & Rehabilitation

## 2013-12-31 ENCOUNTER — Encounter: Payer: Medicare Other | Attending: Physical Medicine & Rehabilitation

## 2013-12-31 VITALS — BP 185/93 | HR 78 | Resp 16

## 2013-12-31 DIAGNOSIS — I69898 Other sequelae of other cerebrovascular disease: Secondary | ICD-10-CM | POA: Diagnosis not present

## 2013-12-31 DIAGNOSIS — Z5189 Encounter for other specified aftercare: Secondary | ICD-10-CM | POA: Diagnosis not present

## 2013-12-31 DIAGNOSIS — I619 Nontraumatic intracerebral hemorrhage, unspecified: Secondary | ICD-10-CM | POA: Insufficient documentation

## 2013-12-31 DIAGNOSIS — F17200 Nicotine dependence, unspecified, uncomplicated: Secondary | ICD-10-CM | POA: Diagnosis not present

## 2013-12-31 DIAGNOSIS — G14 Postpolio syndrome: Secondary | ICD-10-CM | POA: Diagnosis not present

## 2013-12-31 DIAGNOSIS — G8250 Quadriplegia, unspecified: Secondary | ICD-10-CM | POA: Insufficient documentation

## 2013-12-31 DIAGNOSIS — I10 Essential (primary) hypertension: Secondary | ICD-10-CM | POA: Diagnosis not present

## 2013-12-31 DIAGNOSIS — B91 Sequelae of poliomyelitis: Secondary | ICD-10-CM

## 2013-12-31 DIAGNOSIS — I69998 Other sequelae following unspecified cerebrovascular disease: Secondary | ICD-10-CM | POA: Diagnosis not present

## 2013-12-31 DIAGNOSIS — M2450 Contracture, unspecified joint: Secondary | ICD-10-CM | POA: Diagnosis not present

## 2013-12-31 DIAGNOSIS — G8389 Other specified paralytic syndromes: Secondary | ICD-10-CM

## 2013-12-31 DIAGNOSIS — E640 Sequelae of protein-calorie malnutrition: Secondary | ICD-10-CM | POA: Diagnosis not present

## 2013-12-31 DIAGNOSIS — M6250 Muscle wasting and atrophy, not elsewhere classified, unspecified site: Secondary | ICD-10-CM | POA: Diagnosis not present

## 2013-12-31 NOTE — Progress Notes (Signed)
   Subjective:    Patient ID: [name], male    DOB: 10/04/1950, 63 y.o.   MRN: [id number] 63 y.o. 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 CT head with acute Intraparenchymal hemorrhage right basal ganglia. He had increase in lethargy on 05/14 and follow up CCT with additional multifocal ischemic infarcts involving bilateral parietal and temporal lobes as well as right occipital lobe. 2D echo with EF 65-70% with no wall abnormality. Carotid dopplers without ICA stenosis. Patient with resultant paraparesis as well as LUE weakness, apraxia, decreased verbal output with naming errors as well as difficulty with multistep commands. He was admitted to [facility] for follow up therapies.   He was discharged to [facility] on 09/14/13.   HPI [name] to emergency department 12/17/2013 for right shoulder pain X-rays showed degenerative changes in the humeral head with spurring. Humerus was internally rotated Also emergency department visit for uncontrolled hypertension 12/24/2013. Placed on 3 blood pressure medications.See EPIC notes  Patient is now living with his wife at home. Arrives today without shoes because they were in a hurry.  Review of Systems See nursing    Objective:   Physical Exam  Nursing note and vitals reviewed.  Pain with right shoulder range of motion. Internal rotation mild subluxation. Pain with overhead activities. Motor strength remains 2 minus bilateral deltoid, bicep triceps 3 minus grip 2 minus left hip abductor, 2 minus hamstring Otherwise 0/5 Muscle atrophy in all 4 extremities. Bilateral knee flexion contractures. Equinovarus positioning of both feet.  Reclined in wheelchair with poor postural  support     Assessment & Plan:  1. 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 He made a trip to [place] and came back nonambulatory, this is diffcult to believe given the chronic atrophy and fasiculations noted in RUE and BLEs  Patient needs PCP followup or hypertension.  Will also follow up with neurology  2. Right shoulder pain multifactorial, glenohumeral arthritis as well as chronic muscle atrophy Will inject  Shoulder injection Right  Palpation guided  Indication:Right Shoulder pain not relieved by medication management and other conservative care.  Informed consent was obtained after describing risks and benefits of the procedure with the patient, this includes bleeding, bruising, infection and medication side effects. The patient wishes to proceed and has given written consent. Patient was placed in a seated position. The Right shoulder was marked and prepped with betadine in the subacromial area. A 25-gauge 1-1/2 inch needle was inserted into the subacromial area. After negative draw back for blood, a solution containing 1 mL of 6 mg per ML betamethasone and 4 mL of 1% lidocaine was injected. A band aid was applied. The patient tolerated the procedure well. Post procedure instructions were given.  Return to clinic when necessary

## 2013-12-31 NOTE — Patient Instructions (Signed)
Joint Injection  Care After  Refer to this sheet in the next few days. These instructions provide you with information on caring for yourself after you have had a joint injection. Your caregiver also may give you more specific instructions. Your treatment has been planned according to current medical practices, but problems sometimes occur. Call your caregiver if you have any problems or questions after your procedure.  After any type of joint injection, it is not uncommon to experience:  · Soreness, swelling, or bruising around the injection site.  · Mild numbness, tingling, or weakness around the injection site caused by the numbing medicine used before or with the injection.  It also is possible to experience the following effects associated with the specific agent after injection:  · Iodine-based contrast agents:  ¨ Allergic reaction (itching, hives, widespread redness, and swelling beyond the injection site).  · Corticosteroids (These effects are rare.):  ¨ Allergic reaction.  ¨ Increased blood sugar levels (If you have diabetes and you notice that your blood sugar levels have increased, notify your caregiver).  ¨ Increased blood pressure levels.  ¨ Mood swings.  · Hyaluronic acid in the use of viscosupplementation.  ¨ Temporary heat or redness.  ¨ Temporary rash and itching.  ¨ Increased fluid accumulation in the injected joint.  These effects all should resolve within a day after your procedure.   HOME CARE INSTRUCTIONS  · Limit yourself to light activity the day of your procedure. Avoid lifting heavy objects, bending, stooping, or twisting.  · Take prescription or over-the-counter pain medication as directed by your caregiver.  · You may apply ice to your injection site to reduce pain and swelling the day of your procedure. Ice may be applied 03-04 times:  ¨ Put ice in a plastic bag.  ¨ Place a towel between your skin and the bag.  ¨ Leave the ice on for no longer than 15-20 minutes each time.  SEEK  IMMEDIATE MEDICAL CARE IF:   · Pain and swelling get worse rather than better or extend beyond the injection site.  · Numbness does not go away.  · Blood or fluid continues to leak from the injection site.  · You have chest pain.  · You have swelling of your face or tongue.  · You have trouble breathing or you become dizzy.  · You develop a fever, chills, or severe tenderness at the injection site that last longer than 1 day.  MAKE SURE YOU:  · Understand these instructions.  · Watch your condition.  · Get help right away if you are not doing well or if you get worse.  Document Released: 12/10/2010 Document Revised: 06/21/2011 Document Reviewed: 12/10/2010  ExitCare® Patient Information ©2015 [employer]. This information is not intended to replace advice given to you by your health care provider. Make sure you discuss any questions you have with your health care provider.

## 2013-12-31 NOTE — Progress Notes (Signed)
   Subjective:    Patient ID: [name], male    DOB: 03/24/1951, 63 y.o.   MRN: [id number]  HPI  Pain Inventory Average Pain 0 Pain Right Now 0 My pain is no pain  In the last 24 hours, has pain interfered with the following? General activity 0 Relation with others 0 Enjoyment of life 0 What TIME of day is your pain at its worst? no pain Sleep (in general) no pain  Pain is worse with: no pain Pain improves with: no pain Relief from Meds: no pain  Mobility use a wheelchair needs help with transfers  Function I need assistance with the following:  dressing, bathing, toileting, meal prep, household duties and shopping  Neuro/Psych trouble walking  Prior Studies Any changes since last visit?  no  Physicians involved in your care Any changes since last visit?  no   Family History  Problem Relation Age of Onset  . Hypertension Mother   . Hypertension Father    History   Social History  . Marital Status: Married    Spouse Name: Catha Nottingham    Number of Children: 3  . Years of Education: 11th   Occupational History  . retired    Social History Main Topics  . Smoking status: Current Every Day Smoker    Types: Cigarettes  . Smokeless tobacco: None  . Alcohol Use: No  . Drug Use: No  . Sexual Activity: No   Other Topics Concern  . None   Social History Narrative   Patient is currently in [place]   Patient is right handed   Patient drink tea,coffee         History reviewed. No pertinent past surgical history. Past Medical History  Diagnosis Date  . Hypertension   . Polio   . Stroke    BP 185/93  Pulse 78  Resp 16  SpO2 98%  Opioid Risk Score:   Fall Risk Score: High Fall Risk (>13 points) (educated and given handout on fall prevention in the home)  Review of Systems  Musculoskeletal: Positive for gait problem.  All other systems reviewed and are negative.      Objective:   Physical Exam   See prior note     Assessment & Plan:    See previous note

## 2014-01-01 ENCOUNTER — Telehealth: Payer: Self-pay | Admitting: Neurology

## 2014-01-01 ENCOUNTER — Ambulatory Visit: Payer: Medicare Other | Admitting: Neurology

## 2014-01-01 ENCOUNTER — Telehealth: Payer: Self-pay | Admitting: *Deleted

## 2014-01-01 DIAGNOSIS — M6250 Muscle wasting and atrophy, not elsewhere classified, unspecified site: Secondary | ICD-10-CM | POA: Diagnosis not present

## 2014-01-01 DIAGNOSIS — E640 Sequelae of protein-calorie malnutrition: Secondary | ICD-10-CM | POA: Diagnosis not present

## 2014-01-01 DIAGNOSIS — I69898 Other sequelae of other cerebrovascular disease: Secondary | ICD-10-CM | POA: Diagnosis not present

## 2014-01-01 DIAGNOSIS — G14 Postpolio syndrome: Secondary | ICD-10-CM | POA: Diagnosis not present

## 2014-01-01 DIAGNOSIS — M2450 Contracture, unspecified joint: Secondary | ICD-10-CM | POA: Diagnosis not present

## 2014-01-01 DIAGNOSIS — Z5189 Encounter for other specified aftercare: Secondary | ICD-10-CM | POA: Diagnosis not present

## 2014-01-01 NOTE — Telephone Encounter (Signed)
RSWNIOE, physical therapist from [facility] calling to get home health physical therapy orders for patient, please return call and advise.

## 2014-01-01 NOTE — Telephone Encounter (Signed)
Pt calling about cancelling appt with our office for tomorrow.   They called Dr. Letta Pate by mistake.  Corene Cornea tried to relay that appt was with [place] and gave them #, > not sure they understood.  I called and LMVM for pt or family member to call our office and confirm about appt.

## 2014-01-01 NOTE — Telephone Encounter (Signed)
I LMVM again about confirming appt.

## 2014-01-02 ENCOUNTER — Ambulatory Visit (INDEPENDENT_AMBULATORY_CARE_PROVIDER_SITE_OTHER): Payer: Medicare Other | Admitting: Neurology

## 2014-01-02 ENCOUNTER — Encounter: Payer: Self-pay | Admitting: Neurology

## 2014-01-02 VITALS — BP 167/85 | HR 71

## 2014-01-02 DIAGNOSIS — I610 Nontraumatic intracerebral hemorrhage in hemisphere, subcortical: Secondary | ICD-10-CM

## 2014-01-02 DIAGNOSIS — I6350 Cerebral infarction due to unspecified occlusion or stenosis of unspecified cerebral artery: Secondary | ICD-10-CM

## 2014-01-02 DIAGNOSIS — I6340 Cerebral infarction due to embolism of unspecified cerebral artery: Secondary | ICD-10-CM | POA: Diagnosis not present

## 2014-01-02 DIAGNOSIS — I639 Cerebral infarction, unspecified: Secondary | ICD-10-CM

## 2014-01-02 DIAGNOSIS — E640 Sequelae of protein-calorie malnutrition: Secondary | ICD-10-CM | POA: Diagnosis not present

## 2014-01-02 DIAGNOSIS — I619 Nontraumatic intracerebral hemorrhage, unspecified: Secondary | ICD-10-CM

## 2014-01-02 DIAGNOSIS — I69898 Other sequelae of other cerebrovascular disease: Secondary | ICD-10-CM | POA: Diagnosis not present

## 2014-01-02 DIAGNOSIS — I10 Essential (primary) hypertension: Secondary | ICD-10-CM | POA: Diagnosis not present

## 2014-01-02 DIAGNOSIS — Z5189 Encounter for other specified aftercare: Secondary | ICD-10-CM | POA: Diagnosis not present

## 2014-01-02 DIAGNOSIS — M6250 Muscle wasting and atrophy, not elsewhere classified, unspecified site: Secondary | ICD-10-CM | POA: Diagnosis not present

## 2014-01-02 DIAGNOSIS — M2450 Contracture, unspecified joint: Secondary | ICD-10-CM | POA: Diagnosis not present

## 2014-01-02 DIAGNOSIS — G14 Postpolio syndrome: Secondary | ICD-10-CM | POA: Diagnosis not present

## 2014-01-02 MED ORDER — ASPIRIN EC 81 MG PO TBEC: 81.0000 mg | DELAYED_RELEASE_TABLET | Freq: Every day | ORAL | Status: DC

## 2014-01-02 NOTE — Telephone Encounter (Signed)
Patient is to his new primary after his appointment with [place] today.Darrell KitchenMarland KitchenMarland KitchenRWCHJSC with [facility] is calling to continue pt please advise

## 2014-01-02 NOTE — Telephone Encounter (Signed)
Called and gave verbal orders for another month PT.

## 2014-01-02 NOTE — Telephone Encounter (Signed)
Pt came in for appt this am.

## 2014-01-02 NOTE — Progress Notes (Signed)
STROKE NEUROLOGY FOLLOW UP NOTE  NAME: [name] DOB: Nov 27, 1950  REASON FOR VISIT: stroke follow up HISTORY FROM: from chart  Today we had the pleasure of seeing [name] in follow-up at our Neurology Clinic. Pt was accompanied by friend and interpreter.   History Summary [name] is a 63 y.o. 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 Per daughter he was able to walk with assistance of cain but over the few days prior to admission he had been so weak he could not walk. 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 Per daughter he takes his BP medications only when he feels ill but not when he is feeling good--same with ASA. in the ED he was noted to have BP 240/108, 235/111, 264/140. He was admitted to the medical ICU for further evaluation and treatment. His blood pressure was gradually controlled and he has remained neurologically stable. He also has a remote history of right medial thalamic intracerebral hemorrhage in 2012 from which he apparently made good recovery. He has history of childhood polio with significant deformities in both feet and hands but at baseline is able to ambulate with a cane. He was discharged letter to rehab.  10/25/13 follow up -  the patient has been doing stable. He came from rehab for the visit today with his daughter. His BP 147/90 today a little higher than goal. He has been taking clonidine, amlodipine, lisinopril for BP control. He still has some left UE weakness but LE seems improved much. He still has right sided weakness but more on the UE, not consistent with polio but more due to previous stroke.  Interval History  During the interval time,  He was discharge from rehab to home 2 weeks ago. Lives with wife, and wife is taking care his meds. Pt does not know what medication he is taking and does not whether he has PCP  to follow. He stated that he does not have much change since last visit. His BP was 167/85 today in clinic.  Called his daughter and she does not know much after pt's medication  Also called his wife and she can not remember the meds but she said she can check when she gets home tonight. She stated that pt seems not on ASA which was prescribed to him last visit.  During the visit, pt had multiple crying spells, but he denies any sadness. His wife also confirmed that he has frequent crying spells at home also.  The following represents the patient's updated allergies and side effects list: No Known Allergies  Labs since last visit of relevance include the following: Results for orders placed during the hospital encounter of 12/24/13  CBC      Result Value Ref Range   WBC 8.2  4.0 - 10.5 K/uL   RBC 5.03  4.22 - 5.81 MIL/uL   Hemoglobin 14.4  13.0 - 17.0 g/dL   HCT 42.8  39.0 - 52.0 %   MCV 85.1  78.0 - 100.0 fL   MCH 28.6  26.0 - 34.0 pg   MCHC 33.6  30.0 - 36.0 g/dL   RDW 13.7  11.5 - 15.5 %   Platelets 297  150 - 400 K/uL  BASIC METABOLIC PANEL      Result Value Ref Range   Sodium 142  137 -  147 mEq/L   Potassium 3.9  3.7 - 5.3 mEq/L   Chloride 103  96 - 112 mEq/L   CO2 23  19 - 32 mEq/L   Glucose, Bld 89  70 - 99 mg/dL   BUN 13  6 - 23 mg/dL   Creatinine, Ser 0.50  0.50 - 1.35 mg/dL   Calcium 9.6  8.4 - 10.5 mg/dL   GFR calc non Af Amer >90  >90 mL/min   GFR calc Af Amer >90  >90 mL/min   Anion gap 16 (*) 5 - 15    The neurologically relevant items on the patient's problem list were reviewed on today's visit.  Neurologic Examination  A problem focused neurological exam (12 or more points of the single system neurologic examination, vital signs counts as 1 point, cranial nerves count for 8 points) was performed.  Blood pressure 167/85, pulse 71, height 0' (0 m), weight 0 lb (0 kg).  General - thin, well developed, in no apparent distress, pleasant, limited  english.  Ophthalmologic - not able to see through.  Cardiovascular - Regular rate and rhythm with no murmur.  Mental Status -  Level of arousal and orientation to place, and person were intact,  But not to time. Language including expression, naming, repetition, comprehension was tried to assess but not successful due to limited [name] but as per daughter, he is speaking vietnamese no problem.   Cranial Nerves II - XII - II - Vision intact OU. III, IV, VI - Extraocular movements intact. V - Facial sensation intact bilaterally. VII - Facial movement intact bilaterally. VIII - Hearing & vestibular intact bilaterally. X - Palate elevates symmetrically. XI - Chin turning & shoulder shrug intact bilaterally. XII - Tongue protrusion intact.  Motor Strength - The patient's strength was 0/5 R shoulder, 1/5 R bicep and 0/5 R tricep and 3+/5 finger grip. Left 3/5 shoulder, 3/5 bicep and tricep and 4+/5 finger grip. RLE 0/5 and LLE 3-/5 proximal and 2/5 distally.  Motor Tone - Muscle tone was assessed at the neck and appendages and was normal.  Reflexes - The patient's reflexes were decreased in all extremities and he had no pathological reflexes.  Sensory - Light touch, temperature/pinprick were assessed and were symmetrical.    Coordination - not able to perform.  Tremor was absent.  Gait and Station - not able to test.  Data reviewed: I personally reviewed the images and agree with the radiology interpretations.  11/18/2010 MRI -  1.  Stable hemorrhagic infarct of the medial right thalamus with mass effect extending into the third ventricle but no intraventricular hemorrhage. 2.  More remote punctate hemorrhage in the posterior right thalamus. 3.  Multiple remote lacunar infarcts involving the basal ganglia bilaterally, corpus callosum, and the cerebellum. 4.  Extensive white matter disease. MRA -  1.  Extensive small vessel disease. 2.  Moderate stenosis of the distal right M1  segment with asymmetric signal loss in the right greater than left MCA branch vessels. 3.  Potential high-grade stenosis of the proximal left A1 segment. CT head -   1.  Small right medial thalamic hematoma, likely a hypertensive bleed. 2.  Mass effect on the third ventricle but no hydrocephalus. 3.  Multiple remote appearing lacunar type infarcts  08/23/2013 MRI -  1. Similar appearance of right basal ganglia hemorrhage, compatible with acute hemorrhagic infarct related to history of malignant hypertension. 2. Additional multi focal ischemic infarcts involving the bilateral parietal and temporal lobes as well  as the right occipital lobe as above. 3. Remote infarcts involving the left basal ganglia/left corona radiata and right middle cerebellar peduncle. 4. Atrophy with advanced chronic microvascular ischemic disease. CT head - A 1.5 x 1.6 cm intraparenchymal hemorrhage within the right basal ganglial region. 2D echo - - Left ventricle: The cavity size was normal. There was moderate concentric hypertrophy. Systolic function was vigorous. The estimated ejection fraction was 75%, in the range of 65% to 70%. Wall motion was normal; there were no regional wall motion abnormalities. Doppler parameters are consistent with abnormal left ventricular relaxation (grade 1 diastolic dysfunction). - Aortic valve: Trileaflet; normal thickness leaflets. No regurgitation. - Mitral valve: No regurgitation. - Right ventricle: Systolic function was normal. - Right atrium: The atrium was normal in size. - Tricuspid valve: No regurgitation. - Pulmonary arteries: Systolic pressure was within the normal range. - Pericardium, extracardiac: There was no pericardial effusion. Carotid doppler The vertebral arteries appear patent with antegrade flow. - Findings consistent with 1-39 percent stenosis involving the right internal carotid artery and the left internal carotid artery.  Component     Latest Ref  Rng 08/23/2013  Cholesterol     0 - 200 mg/dL 157  Triglycerides     <150 mg/dL 168 (H)  HDL     >39 mg/dL 34 (L)  Total CHOL/HDL Ratio      4.6  VLDL     0 - 40 mg/dL 34  LDL (calc)     0 - 99 mg/dL 89    Assessment: As you may recall, he is a 63 y.o.  Asian [name] with a diagnosis of right BG bleeding. He also had right thalamic bleeding in 2012 but with minimal deficit residue. He also have multiple bilateral lacunar strokes in the MRI. 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 However, he did have multiple cortical punctate strokes found on current MRI which also raise concern of embolic stroke. His carotid doppler seems not a concern and cardioembolic will need to be considered. But I will hold off any further testing include TCD MES detection, cardionet or loop recorder due to he is not a candidate for anticoagulation therapy. Last visit, I added baby ASA and low dose statin to his medication regimen, but it seems he is not on ASA but on low dose lipitor. Will need to communicate with wife to let her know to give pt ASA. He had no blood draw on last visit, will do it today.  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 He will need to discuss with his wife regarding the medication also.  Diagnoses from this visit: Nontraumatic subcortical hemorrhage of cerebral hemisphere - Plan: Ambulatory referral to [facility], Lipid panel  Stroke - Plan: Ambulatory referral to Family Practice, Lipid panel  Essential hypertension - Plan: Ambulatory referral to [facility], Lipid panel  Plan:  - he needs to setup primary care to follow up and for stroke risk factor modification - check BP at home and our goal is 130/80 - stroke labs including A1C and thyroid function and lipid panel today. 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 Pt was given meds info today and also will find out his insurance coverage for this meds. - hold off any further testing include  TCD MES detection, cardionet or loop recorder due to he is not a candidate for anticoagulation therapy - RTC in 2 months. I requested that his wife or daughter come with him for visit.   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 I will give you some information for the medication and please think about it. And I will also check your insurance to see if they will cover. - follow up with PCP for stroke risk factor modification and BP control - follow up in 2 months. Please bring your wife or daughter with you for the visit.    Rosalin Hawking, MD PhD [facility] [address] [place], [place] [address] [phone]

## 2014-01-02 NOTE — Patient Instructions (Signed)
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 I will give you some information for the medication and please think about it. And I will also check your insurance to see if they will cover. - follow up with PCP for stroke risk factor modification and BP control - follow up in 2 months. Please bring your wife or daughter with you for the visit.

## 2014-01-03 ENCOUNTER — Other Ambulatory Visit (INDEPENDENT_AMBULATORY_CARE_PROVIDER_SITE_OTHER): Payer: Self-pay

## 2014-01-03 DIAGNOSIS — E640 Sequelae of protein-calorie malnutrition: Secondary | ICD-10-CM | POA: Diagnosis not present

## 2014-01-03 DIAGNOSIS — M2450 Contracture, unspecified joint: Secondary | ICD-10-CM | POA: Diagnosis not present

## 2014-01-03 DIAGNOSIS — G14 Postpolio syndrome: Secondary | ICD-10-CM | POA: Diagnosis not present

## 2014-01-03 DIAGNOSIS — I69898 Other sequelae of other cerebrovascular disease: Secondary | ICD-10-CM | POA: Diagnosis not present

## 2014-01-03 DIAGNOSIS — Z0289 Encounter for other administrative examinations: Secondary | ICD-10-CM

## 2014-01-03 DIAGNOSIS — I619 Nontraumatic intracerebral hemorrhage, unspecified: Secondary | ICD-10-CM | POA: Diagnosis not present

## 2014-01-03 DIAGNOSIS — I6350 Cerebral infarction due to unspecified occlusion or stenosis of unspecified cerebral artery: Secondary | ICD-10-CM | POA: Diagnosis not present

## 2014-01-03 DIAGNOSIS — M6250 Muscle wasting and atrophy, not elsewhere classified, unspecified site: Secondary | ICD-10-CM | POA: Diagnosis not present

## 2014-01-03 DIAGNOSIS — Z5189 Encounter for other specified aftercare: Secondary | ICD-10-CM | POA: Diagnosis not present

## 2014-01-03 DIAGNOSIS — I10 Essential (primary) hypertension: Secondary | ICD-10-CM | POA: Diagnosis not present

## 2014-01-04 LAB — LIPID PANEL
CHOL/HDL RATIO: 6.3 ratio — AB (ref 0.0–5.0)
Cholesterol, Total: 175 mg/dL (ref 100–199)
HDL: 28 mg/dL — ABNORMAL LOW (ref >39)
LDL Calculated: 108 mg/dL — ABNORMAL HIGH (ref 0–99)
TRIGLYCERIDES: 195 mg/dL — AB (ref 0–149)
VLDL Cholesterol Cal: 39 mg/dL (ref 5–40)

## 2014-01-05 DIAGNOSIS — J209 Acute bronchitis, unspecified: Secondary | ICD-10-CM | POA: Diagnosis not present

## 2014-01-05 DIAGNOSIS — R5381 Other malaise: Secondary | ICD-10-CM | POA: Diagnosis not present

## 2014-01-05 DIAGNOSIS — I10 Essential (primary) hypertension: Secondary | ICD-10-CM | POA: Diagnosis not present

## 2014-01-05 DIAGNOSIS — G8929 Other chronic pain: Secondary | ICD-10-CM | POA: Diagnosis not present

## 2014-01-05 DIAGNOSIS — R5383 Other fatigue: Secondary | ICD-10-CM | POA: Diagnosis not present

## 2014-01-07 DIAGNOSIS — I69898 Other sequelae of other cerebrovascular disease: Secondary | ICD-10-CM | POA: Diagnosis not present

## 2014-01-07 DIAGNOSIS — Z5189 Encounter for other specified aftercare: Secondary | ICD-10-CM | POA: Diagnosis not present

## 2014-01-07 DIAGNOSIS — E640 Sequelae of protein-calorie malnutrition: Secondary | ICD-10-CM | POA: Diagnosis not present

## 2014-01-07 DIAGNOSIS — M6250 Muscle wasting and atrophy, not elsewhere classified, unspecified site: Secondary | ICD-10-CM | POA: Diagnosis not present

## 2014-01-07 DIAGNOSIS — G14 Postpolio syndrome: Secondary | ICD-10-CM | POA: Diagnosis not present

## 2014-01-07 DIAGNOSIS — M2450 Contracture, unspecified joint: Secondary | ICD-10-CM | POA: Diagnosis not present

## 2014-01-08 DIAGNOSIS — M6250 Muscle wasting and atrophy, not elsewhere classified, unspecified site: Secondary | ICD-10-CM | POA: Diagnosis not present

## 2014-01-08 DIAGNOSIS — M2450 Contracture, unspecified joint: Secondary | ICD-10-CM | POA: Diagnosis not present

## 2014-01-08 DIAGNOSIS — Z5189 Encounter for other specified aftercare: Secondary | ICD-10-CM | POA: Diagnosis not present

## 2014-01-08 DIAGNOSIS — E640 Sequelae of protein-calorie malnutrition: Secondary | ICD-10-CM | POA: Diagnosis not present

## 2014-01-08 DIAGNOSIS — G14 Postpolio syndrome: Secondary | ICD-10-CM | POA: Diagnosis not present

## 2014-01-08 DIAGNOSIS — I69898 Other sequelae of other cerebrovascular disease: Secondary | ICD-10-CM | POA: Diagnosis not present

## 2014-01-09 DIAGNOSIS — Z5189 Encounter for other specified aftercare: Secondary | ICD-10-CM | POA: Diagnosis not present

## 2014-01-09 DIAGNOSIS — G14 Postpolio syndrome: Secondary | ICD-10-CM | POA: Diagnosis not present

## 2014-01-09 DIAGNOSIS — M2450 Contracture, unspecified joint: Secondary | ICD-10-CM | POA: Diagnosis not present

## 2014-01-09 DIAGNOSIS — M6250 Muscle wasting and atrophy, not elsewhere classified, unspecified site: Secondary | ICD-10-CM | POA: Diagnosis not present

## 2014-01-09 DIAGNOSIS — I69898 Other sequelae of other cerebrovascular disease: Secondary | ICD-10-CM | POA: Diagnosis not present

## 2014-01-09 DIAGNOSIS — E640 Sequelae of protein-calorie malnutrition: Secondary | ICD-10-CM | POA: Diagnosis not present

## 2014-01-10 DIAGNOSIS — I10 Essential (primary) hypertension: Secondary | ICD-10-CM | POA: Diagnosis not present

## 2014-01-10 DIAGNOSIS — J42 Unspecified chronic bronchitis: Secondary | ICD-10-CM | POA: Diagnosis not present

## 2014-01-10 DIAGNOSIS — F0631 Mood disorder due to known physiological condition with depressive features: Secondary | ICD-10-CM | POA: Diagnosis not present

## 2014-01-10 DIAGNOSIS — E780 Pure hypercholesterolemia: Secondary | ICD-10-CM | POA: Diagnosis not present

## 2014-01-10 DIAGNOSIS — G464 Cerebellar stroke syndrome: Secondary | ICD-10-CM | POA: Diagnosis not present

## 2014-01-10 DIAGNOSIS — Z125 Encounter for screening for malignant neoplasm of prostate: Secondary | ICD-10-CM | POA: Diagnosis not present

## 2014-01-11 DIAGNOSIS — M2450 Contracture, unspecified joint: Secondary | ICD-10-CM | POA: Diagnosis not present

## 2014-01-11 DIAGNOSIS — Z5189 Encounter for other specified aftercare: Secondary | ICD-10-CM | POA: Diagnosis not present

## 2014-01-11 DIAGNOSIS — G14 Postpolio syndrome: Secondary | ICD-10-CM | POA: Diagnosis not present

## 2014-01-11 DIAGNOSIS — E640 Sequelae of protein-calorie malnutrition: Secondary | ICD-10-CM | POA: Diagnosis not present

## 2014-01-11 DIAGNOSIS — I69898 Other sequelae of other cerebrovascular disease: Secondary | ICD-10-CM | POA: Diagnosis not present

## 2014-01-11 DIAGNOSIS — M6250 Muscle wasting and atrophy, not elsewhere classified, unspecified site: Secondary | ICD-10-CM | POA: Diagnosis not present

## 2014-01-15 DIAGNOSIS — M2450 Contracture, unspecified joint: Secondary | ICD-10-CM | POA: Diagnosis not present

## 2014-01-15 DIAGNOSIS — E640 Sequelae of protein-calorie malnutrition: Secondary | ICD-10-CM | POA: Diagnosis not present

## 2014-01-15 DIAGNOSIS — M6250 Muscle wasting and atrophy, not elsewhere classified, unspecified site: Secondary | ICD-10-CM | POA: Diagnosis not present

## 2014-01-15 DIAGNOSIS — G14 Postpolio syndrome: Secondary | ICD-10-CM | POA: Diagnosis not present

## 2014-01-15 DIAGNOSIS — Z5189 Encounter for other specified aftercare: Secondary | ICD-10-CM | POA: Diagnosis not present

## 2014-01-15 DIAGNOSIS — I69898 Other sequelae of other cerebrovascular disease: Secondary | ICD-10-CM | POA: Diagnosis not present

## 2014-01-16 DIAGNOSIS — M6250 Muscle wasting and atrophy, not elsewhere classified, unspecified site: Secondary | ICD-10-CM | POA: Diagnosis not present

## 2014-01-16 DIAGNOSIS — M2450 Contracture, unspecified joint: Secondary | ICD-10-CM | POA: Diagnosis not present

## 2014-01-16 DIAGNOSIS — G14 Postpolio syndrome: Secondary | ICD-10-CM | POA: Diagnosis not present

## 2014-01-16 DIAGNOSIS — Z5189 Encounter for other specified aftercare: Secondary | ICD-10-CM | POA: Diagnosis not present

## 2014-01-16 DIAGNOSIS — E640 Sequelae of protein-calorie malnutrition: Secondary | ICD-10-CM | POA: Diagnosis not present

## 2014-01-16 DIAGNOSIS — I69898 Other sequelae of other cerebrovascular disease: Secondary | ICD-10-CM | POA: Diagnosis not present

## 2014-01-17 DIAGNOSIS — I69898 Other sequelae of other cerebrovascular disease: Secondary | ICD-10-CM | POA: Diagnosis not present

## 2014-01-17 DIAGNOSIS — Z5189 Encounter for other specified aftercare: Secondary | ICD-10-CM | POA: Diagnosis not present

## 2014-01-17 DIAGNOSIS — M2450 Contracture, unspecified joint: Secondary | ICD-10-CM | POA: Diagnosis not present

## 2014-01-17 DIAGNOSIS — M6250 Muscle wasting and atrophy, not elsewhere classified, unspecified site: Secondary | ICD-10-CM | POA: Diagnosis not present

## 2014-01-17 DIAGNOSIS — E640 Sequelae of protein-calorie malnutrition: Secondary | ICD-10-CM | POA: Diagnosis not present

## 2014-01-17 DIAGNOSIS — G14 Postpolio syndrome: Secondary | ICD-10-CM | POA: Diagnosis not present

## 2014-01-18 ENCOUNTER — Telehealth: Payer: Self-pay | Admitting: Neurology

## 2014-01-18 NOTE — Telephone Encounter (Signed)
Maudie Mercury, Occupational Therapist with [facility] @ [phone], requesting verbal order for 1 additional Occupation Therapy visit.  Please call and advise.

## 2014-01-18 NOTE — Telephone Encounter (Signed)
See phone note 01/18/14:

## 2014-01-19 NOTE — Telephone Encounter (Signed)
I agree with additional OT visit. What can I do for that? Thanks.   Rosalin Hawking, MD PhD Stroke Neurology 01/19/2014 1:35 PM

## 2014-01-21 DIAGNOSIS — Z5189 Encounter for other specified aftercare: Secondary | ICD-10-CM | POA: Diagnosis not present

## 2014-01-21 DIAGNOSIS — M6250 Muscle wasting and atrophy, not elsewhere classified, unspecified site: Secondary | ICD-10-CM | POA: Diagnosis not present

## 2014-01-21 DIAGNOSIS — E640 Sequelae of protein-calorie malnutrition: Secondary | ICD-10-CM | POA: Diagnosis not present

## 2014-01-21 DIAGNOSIS — M2450 Contracture, unspecified joint: Secondary | ICD-10-CM | POA: Diagnosis not present

## 2014-01-21 DIAGNOSIS — I69898 Other sequelae of other cerebrovascular disease: Secondary | ICD-10-CM | POA: Diagnosis not present

## 2014-01-21 DIAGNOSIS — G14 Postpolio syndrome: Secondary | ICD-10-CM | POA: Diagnosis not present

## 2014-01-21 NOTE — Telephone Encounter (Signed)
I called and left message for occupational therapist that you agreed to additional OT for the patient.

## 2014-01-22 ENCOUNTER — Telehealth: Payer: Self-pay | Admitting: Neurology

## 2014-01-22 DIAGNOSIS — I610 Nontraumatic intracerebral hemorrhage in hemisphere, subcortical: Secondary | ICD-10-CM

## 2014-01-22 NOTE — Telephone Encounter (Signed)
Kim with [facility] @ [phone], has recommended patient receive a R hand soft resting splint for during the night.  Please call and advise.

## 2014-01-23 DIAGNOSIS — I69898 Other sequelae of other cerebrovascular disease: Secondary | ICD-10-CM | POA: Diagnosis not present

## 2014-01-23 DIAGNOSIS — M2450 Contracture, unspecified joint: Secondary | ICD-10-CM | POA: Diagnosis not present

## 2014-01-23 DIAGNOSIS — E640 Sequelae of protein-calorie malnutrition: Secondary | ICD-10-CM | POA: Diagnosis not present

## 2014-01-23 DIAGNOSIS — Z5189 Encounter for other specified aftercare: Secondary | ICD-10-CM | POA: Diagnosis not present

## 2014-01-23 DIAGNOSIS — G14 Postpolio syndrome: Secondary | ICD-10-CM | POA: Diagnosis not present

## 2014-01-23 DIAGNOSIS — M6250 Muscle wasting and atrophy, not elsewhere classified, unspecified site: Secondary | ICD-10-CM | POA: Diagnosis not present

## 2014-01-23 NOTE — Telephone Encounter (Signed)
Dr. Erlinda Hong please put in orders for a soft right splint for the patient. Once its in i will help with everything else.

## 2014-01-23 NOTE — Telephone Encounter (Signed)
I did. Please proceed. Thanks.  Rosalin Hawking, MD PhD Stroke Neurology 01/23/2014 7:00 PM

## 2014-01-24 ENCOUNTER — Telehealth: Payer: Self-pay | Admitting: *Deleted

## 2014-01-24 NOTE — Telephone Encounter (Signed)
Kim returned call and asked that order be faxed to [facility] @ [phone]. This has been completed confirmation received.

## 2014-01-24 NOTE — Telephone Encounter (Signed)
Left message for [name] with [facility] re: hand splint for this patient. Dr. Erlinda Hong has put in order if any further assistance please call.

## 2014-01-28 DIAGNOSIS — M6250 Muscle wasting and atrophy, not elsewhere classified, unspecified site: Secondary | ICD-10-CM | POA: Diagnosis not present

## 2014-01-28 DIAGNOSIS — E640 Sequelae of protein-calorie malnutrition: Secondary | ICD-10-CM | POA: Diagnosis not present

## 2014-01-28 DIAGNOSIS — I69898 Other sequelae of other cerebrovascular disease: Secondary | ICD-10-CM | POA: Diagnosis not present

## 2014-01-28 DIAGNOSIS — G14 Postpolio syndrome: Secondary | ICD-10-CM | POA: Diagnosis not present

## 2014-01-28 DIAGNOSIS — Z5189 Encounter for other specified aftercare: Secondary | ICD-10-CM | POA: Diagnosis not present

## 2014-01-28 DIAGNOSIS — M2450 Contracture, unspecified joint: Secondary | ICD-10-CM | POA: Diagnosis not present

## 2014-01-30 ENCOUNTER — Telehealth: Payer: Self-pay | Admitting: Neurology

## 2014-01-30 DIAGNOSIS — M6250 Muscle wasting and atrophy, not elsewhere classified, unspecified site: Secondary | ICD-10-CM | POA: Diagnosis not present

## 2014-01-30 DIAGNOSIS — G14 Postpolio syndrome: Secondary | ICD-10-CM | POA: Diagnosis not present

## 2014-01-30 DIAGNOSIS — M2450 Contracture, unspecified joint: Secondary | ICD-10-CM | POA: Diagnosis not present

## 2014-01-30 DIAGNOSIS — E640 Sequelae of protein-calorie malnutrition: Secondary | ICD-10-CM | POA: Diagnosis not present

## 2014-01-30 DIAGNOSIS — I69898 Other sequelae of other cerebrovascular disease: Secondary | ICD-10-CM | POA: Diagnosis not present

## 2014-01-30 DIAGNOSIS — Z5189 Encounter for other specified aftercare: Secondary | ICD-10-CM | POA: Diagnosis not present

## 2014-01-30 NOTE — Telephone Encounter (Signed)
Dr Erlinda Hong, just an fyi

## 2014-01-30 NOTE — Telephone Encounter (Signed)
Heather with [facility] @ [phone], stated patient had meet all his goals and they plan to discharge patient...FYI

## 2014-02-02 DIAGNOSIS — R5382 Chronic fatigue, unspecified: Secondary | ICD-10-CM | POA: Diagnosis not present

## 2014-02-02 DIAGNOSIS — I69369 Other paralytic syndrome following cerebral infarction affecting unspecified side: Secondary | ICD-10-CM | POA: Diagnosis not present

## 2014-02-02 DIAGNOSIS — I10 Essential (primary) hypertension: Secondary | ICD-10-CM | POA: Diagnosis not present

## 2014-02-02 DIAGNOSIS — R05 Cough: Secondary | ICD-10-CM | POA: Diagnosis not present

## 2014-02-25 DIAGNOSIS — Z125 Encounter for screening for malignant neoplasm of prostate: Secondary | ICD-10-CM | POA: Diagnosis not present

## 2014-02-25 DIAGNOSIS — M79609 Pain in unspecified limb: Secondary | ICD-10-CM | POA: Diagnosis not present

## 2014-02-25 DIAGNOSIS — R05 Cough: Secondary | ICD-10-CM | POA: Diagnosis not present

## 2014-02-25 DIAGNOSIS — F329 Major depressive disorder, single episode, unspecified: Secondary | ICD-10-CM | POA: Diagnosis not present

## 2014-02-25 DIAGNOSIS — I10 Essential (primary) hypertension: Secondary | ICD-10-CM | POA: Diagnosis not present

## 2014-02-25 DIAGNOSIS — R1310 Dysphagia, unspecified: Secondary | ICD-10-CM | POA: Diagnosis not present

## 2014-02-25 DIAGNOSIS — I616 Nontraumatic intracerebral hemorrhage, multiple localized: Secondary | ICD-10-CM | POA: Diagnosis not present

## 2014-02-25 DIAGNOSIS — K219 Gastro-esophageal reflux disease without esophagitis: Secondary | ICD-10-CM | POA: Diagnosis not present

## 2014-02-25 DIAGNOSIS — J9801 Acute bronchospasm: Secondary | ICD-10-CM | POA: Diagnosis not present

## 2014-03-15 ENCOUNTER — Ambulatory Visit: Payer: Medicare Other | Admitting: Neurology

## 2014-03-26 DIAGNOSIS — G8250 Quadriplegia, unspecified: Secondary | ICD-10-CM | POA: Diagnosis not present

## 2014-03-26 DIAGNOSIS — R05 Cough: Secondary | ICD-10-CM | POA: Diagnosis not present

## 2014-05-09 ENCOUNTER — Ambulatory Visit (INDEPENDENT_AMBULATORY_CARE_PROVIDER_SITE_OTHER): Payer: Medicare Other | Admitting: Neurology

## 2014-05-09 ENCOUNTER — Encounter: Payer: Self-pay | Admitting: Neurology

## 2014-05-09 VITALS — BP 193/94 | HR 82

## 2014-05-09 DIAGNOSIS — I610 Nontraumatic intracerebral hemorrhage in hemisphere, subcortical: Secondary | ICD-10-CM

## 2014-05-09 DIAGNOSIS — I10 Essential (primary) hypertension: Secondary | ICD-10-CM

## 2014-05-09 DIAGNOSIS — I639 Cerebral infarction, unspecified: Secondary | ICD-10-CM | POA: Diagnosis not present

## 2014-05-09 DIAGNOSIS — I161 Hypertensive emergency: Secondary | ICD-10-CM

## 2014-05-09 DIAGNOSIS — F482 Pseudobulbar affect: Secondary | ICD-10-CM

## 2014-05-09 MED ORDER — ATORVASTATIN CALCIUM 20 MG PO TABS: 10.0000 mg | ORAL_TABLET | Freq: Every day | ORAL | Status: DC

## 2014-05-09 MED ORDER — DEXTROMETHORPHAN-QUINIDINE 20-10 MG PO CAPS: ORAL_CAPSULE | ORAL | Status: DC

## 2014-05-09 MED ORDER — LOSARTAN POTASSIUM 100 MG PO TABS: 50.0000 mg | ORAL_TABLET | Freq: Every day | ORAL | Status: DC

## 2014-05-09 MED ORDER — CLONIDINE HCL 0.1 MG PO TABS: 0.1000 mg | ORAL_TABLET | Freq: Three times a day (TID) | ORAL | Status: DC

## 2014-05-09 MED ORDER — AMLODIPINE BESYLATE 10 MG PO TABS: 10.0000 mg | ORAL_TABLET | Freq: Every day | ORAL | Status: DC

## 2014-05-09 NOTE — Patient Instructions (Signed)
-   continue ASA and lipitor for stroke prevention - I have adjusted your blood pressure medications and please pick up the medication in pharmacy - check BP at home, goal 120-140 - Follow up with your primary care physician for stroke risk factor modification. Recommend maintain blood pressure goal <130/80, diabetes with hemoglobin A1c goal below 6.5% and lipids with LDL cholesterol goal below 70 mg/dL.  - I have put in home health consult and they will contact you - please update your PCP contact info in our system when you check out - follow up in 3 months.

## 2014-05-09 NOTE — Progress Notes (Signed)
STROKE NEUROLOGY FOLLOW UP NOTE  NAME: [name] DOB: 1950-07-21  REASON FOR VISIT: stroke follow up HISTORY FROM: from chart  Today we had the pleasure of seeing [name] in follow-up at our Neurology Clinic. Pt was accompanied by wife and interpreter.   History Summary [name] is a 64 y.o. 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 Per daughter he was able to walk with assistance of cain but over the few days prior to admission he had been so weak he could not walk. 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 Per daughter he takes his BP medications only when he feels ill but not when he is feeling good--same with ASA. in the ED he was noted to have BP 240/108, 235/111, 264/140. He was admitted to the medical ICU for further evaluation and treatment. His blood pressure was gradually controlled and he has remained neurologically stable. He also has a remote history of right medial thalamic intracerebral hemorrhage in 2012 from which he apparently made good recovery. He has history of childhood polio with significant deformities in both feet and hands but at baseline is able to ambulate with a cane. He was discharged letter to rehab.  10/25/13 follow up -  the patient has been doing stable. He came from rehab for the visit today with his daughter. His BP 147/90 today a little higher than goal. He has been taking clonidine, amlodipine, lisinopril for BP control. He still has some left UE weakness but LE seems improved much. He still has right sided weakness but more on the UE, not consistent with polio but more due to previous stroke.  01/02/14 follow up - he was discharge from rehab to home 2 weeks ago. Lives with wife, and wife is taking care his meds. Pt does not know what medication he is taking and does not whether he has PCP to follow. He stated that he  does not have much change since last visit. His BP was 167/85 today in clinic. Called his daughter and she does not know much after pt's medication Also called his wife and she can not remember the meds but she said she can check when she gets home tonight. She stated that pt seems not on ASA which was prescribed to him last visit. During the visit, pt had multiple crying spells, but he denies any sadness. His wife also confirmed that he has frequent crying spells at home also.  Interval History During the interval time, patient has been doing the same. Currently, he is wheelchair-bound with right sided weakness. His wife has to work 10 hours per day, so he was left alone at home with diapers, and his friends come over to feed him during meal hours. They are requesting home health.   He has established PCP, however, the PCP information has not been updated in the system. He is taking amlodipine 5 mg, clonidine 0.1 mg twice an day, losartan 50 mg daily for blood pressure control. However, his blood pressure today in clinic 193/94. Wife stated that at home his blood pressure around 160. He is on aspirin and Lipitor for stroke prevention at home now. However last LDL checked was 108. He still has multiple crying spells every day, since better than before after Zoloft. He has PCP with his next week.  The following represents the  patient's updated allergies and side effects list: No Known Allergies  Labs since last visit of relevance include the following: Results for orders placed or performed in visit on 01/02/14  Lipid panel  Result Value Ref Range   Cholesterol, Total 175 100 - 199 mg/dL   Triglycerides 195 (H) 0 - 149 mg/dL   HDL 28 (L) >39 mg/dL   VLDL Cholesterol Cal 39 5 - 40 mg/dL   LDL Calculated 108 (H) 0 - 99 mg/dL   Chol/HDL Ratio 6.3 (H) 0.0 - 5.0 ratio units    The neurologically relevant items on the patient's problem list were reviewed on today's visit.  Neurologic  Examination  A problem focused neurological exam (12 or more points of the single system neurologic examination, vital signs counts as 1 point, cranial nerves count for 8 points) was performed.  Blood pressure 193/94, pulse 82, height 0' (0 m), weight 0 lb (0 kg).  General - thin, well developed, in no apparent distress, pleasant, limited english.  Ophthalmologic - not able to see through.  Cardiovascular - Regular rate and rhythm with no murmur.  Mental Status -  Level of arousal and orientation to place, and person were intact,  But not to time. Language including expression, naming, repetition, comprehension was tried to assess but not successful due to limited [place] but as per daughter, he is speaking vietnamese no problem.   Cranial Nerves II - XII - II - Vision intact OU. III, IV, VI - Extraocular movements intact. V - Facial sensation intact bilaterally. VII - Facial movement intact bilaterally. VIII - Hearing & vestibular intact bilaterally. X - Palate elevates symmetrically. XI - Chin turning & shoulder shrug intact bilaterally. XII - Tongue protrusion intact.  Motor Strength - The patient's strength was 0/5 R shoulder, 1/5 R bicep and 0/5 R tricep and 3+/5 finger grip. Left 3/5 shoulder, 3/5 bicep and tricep and 4+/5 finger grip. RLE 0/5 and LLE 3-/5 proximal and 2/5 distally.  Motor Tone - Muscle tone was assessed at the neck and appendages and was normal.  Reflexes - The patient's reflexes were decreased in all extremities and he had no pathological reflexes.  Sensory - Light touch, temperature/pinprick were assessed and were symmetrical.    Coordination - not able to perform.  Tremor was absent.  Gait and Station - not able to test.  Data reviewed: I personally reviewed the images and agree with the radiology interpretations.  11/18/2010 MRI -  1.  Stable hemorrhagic infarct of the medial right thalamus with mass effect extending into the third ventricle but no  intraventricular hemorrhage. 2.  More remote punctate hemorrhage in the posterior right thalamus. 3.  Multiple remote lacunar infarcts involving the basal ganglia bilaterally, corpus callosum, and the cerebellum. 4.  Extensive white matter disease. MRA -  1.  Extensive small vessel disease. 2.  Moderate stenosis of the distal right M1 segment with asymmetric signal loss in the right greater than left MCA branch vessels. 3.  Potential high-grade stenosis of the proximal left A1 segment. CT head -   1.  Small right medial thalamic hematoma, likely a hypertensive bleed. 2.  Mass effect on the third ventricle but no hydrocephalus. 3.  Multiple remote appearing lacunar type infarcts  08/23/2013 MRI -  1. Similar appearance of right basal ganglia hemorrhage, compatible with acute hemorrhagic infarct related to history of malignant hypertension. 2. Additional multi focal ischemic infarcts involving the bilateral parietal and temporal lobes as well as the right  occipital lobe as above. 3. Remote infarcts involving the left basal ganglia/left corona radiata and right middle cerebellar peduncle. 4. Atrophy with advanced chronic microvascular ischemic disease. CT head - A 1.5 x 1.6 cm intraparenchymal hemorrhage within the right basal ganglial region. 2D echo - - Left ventricle: The cavity size was normal. There was moderate concentric hypertrophy. Systolic function was vigorous. The estimated ejection fraction was 75%, in the range of 65% to 70%. Wall motion was normal; there were no regional wall motion abnormalities. Doppler parameters are consistent with abnormal left ventricular relaxation (grade 1 diastolic dysfunction). - Aortic valve: Trileaflet; normal thickness leaflets. No regurgitation. - Mitral valve: No regurgitation. - Right ventricle: Systolic function was normal. - Right atrium: The atrium was normal in size. - Tricuspid valve: No regurgitation. - Pulmonary arteries: Systolic  pressure was within the normal range. - Pericardium, extracardiac: There was no pericardial effusion. Carotid doppler The vertebral arteries appear patent with antegrade flow. - Findings consistent with 1-39 percent stenosis involving the right internal carotid artery and the left internal carotid artery.  Component     Latest Ref Rng 08/23/2013  Cholesterol     0 - 200 mg/dL 157  Triglycerides     <150 mg/dL 168 (H)  HDL     >39 mg/dL 34 (L)  Total CHOL/HDL Ratio      4.6  VLDL     0 - 40 mg/dL 34  LDL (calc)     0 - 99 mg/dL 89    Assessment: As you may recall, he is a 64 y.o.  Asian male with a diagnosis of right BG bleeding. He also had right thalamic bleeding in 2012 but with minimal deficit residue. He also have multiple bilateral lacunar strokes in the MRI. 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 However, he did have multiple cortical punctate strokes found on current MRI which also raise concern of embolic stroke. His carotid doppler seems not a concern and cardioembolic will need to be considered. But I will hold off any further testing include TCD MES detection, cardionet or loop recorder due to he is not a candidate for anticoagulation therapy. He is on ASA and low dose lipitor. His blood pressure is still not in good control, will adjust blood pressure medications. He has PCP to follow up next week. 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 We'll also request home health for his daily assistance.  Plan:  - continue ASA and lipitor for stroke prevention. Increased Lipitor from 10mg  to 20 mg per day. - I have increased BP meds for blood pressure control. Increased amlodipine from 5 mg to 10 mg per day, increased clonidine from 0.1 mg bid to tid, increase losartan from 50 mg daily to 100 mg daily. - check BP at home, goal 120-140 - Prescribed Nuedexta for BPA. - home health consult placed for daily assistance - Follow up with your  primary care physician next week for stroke risk factor modification. Recommend maintain blood pressure goal <130/80, diabetes with hemoglobin A1c goal below 6.5% and lipids with LDL cholesterol goal below 70 mg/dL.  - RTC in 3 months.  Meds ordered this encounter  Medications  . MUCINEX 600 MG 12 hr tablet    Sig: Take 600 mg by mouth every 12 (twelve) hours.    Refill:  2  . DISCONTD: losartan (COZAAR) 50 MG tablet    Sig: Take 50 mg by mouth daily.    Refill:  3  .  DISCONTD: amLODipine (NORVASC) 5 MG tablet    Sig: Take 5 mg by mouth daily.    Refill:  3  . amLODipine (NORVASC) 10 MG tablet    Sig: Take 1 tablet (10 mg total) by mouth daily.    Dispense:  90 tablet    Refill:  3  . atorvastatin (LIPITOR) 20 MG tablet    Sig: Take 0.5 tablets (10 mg total) by mouth daily.    Dispense:  90 tablet    Refill:  3  . cloNIDine (CATAPRES) 0.1 MG tablet    Sig: Take 1 tablet (0.1 mg total) by mouth 3 (three) times daily.    Dispense:  90 tablet    Refill:  3  . losartan (COZAAR) 100 MG tablet    Sig: Take 0.5 tablets (50 mg total) by mouth daily.    Dispense:  90 tablet    Refill:  3  . Dextromethorphan-Quinidine 20-10 MG CAPS    Sig: Take one tab daily for 7 days and then 1 tab twice a day after.    Dispense:  60 capsule    Refill:  5   Orders Placed This Encounter  Procedures  . Ambulatory referral to Home Health    Referral Priority:  Routine    Referral Type:  Home Health Care    Referral Reason:  Specialty Services Required    Requested Specialty:  [facility]    Number of Visits Requested:  1   Patient Instructions  - continue ASA and lipitor for stroke prevention - I have adjusted your blood pressure medications and please pick up the medication in pharmacy - check BP at home, goal 120-140 - Follow up with your primary care physician for stroke risk factor modification. Recommend maintain blood pressure goal <130/80, diabetes with hemoglobin A1c goal below  6.5% and lipids with LDL cholesterol goal below 70 mg/dL.  - I have put in home health consult and they will contact you - please update your PCP contact info in our system when you check out - follow up in 3 months.   Rosalin Hawking, MD PhD [facility] [address], [place], [place] [address] [phone]

## 2014-05-16 ENCOUNTER — Telehealth: Payer: Self-pay | Admitting: Neurology

## 2014-05-16 DIAGNOSIS — F341 Dysthymic disorder: Secondary | ICD-10-CM | POA: Diagnosis not present

## 2014-05-16 DIAGNOSIS — L89322 Pressure ulcer of left buttock, stage 2: Secondary | ICD-10-CM | POA: Diagnosis not present

## 2014-05-16 DIAGNOSIS — M6250 Muscle wasting and atrophy, not elsewhere classified, unspecified site: Secondary | ICD-10-CM | POA: Diagnosis not present

## 2014-05-16 DIAGNOSIS — M6281 Muscle weakness (generalized): Secondary | ICD-10-CM | POA: Diagnosis not present

## 2014-05-16 DIAGNOSIS — I69398 Other sequelae of cerebral infarction: Secondary | ICD-10-CM | POA: Diagnosis not present

## 2014-05-16 DIAGNOSIS — B91 Sequelae of poliomyelitis: Secondary | ICD-10-CM | POA: Diagnosis not present

## 2014-05-16 DIAGNOSIS — G8250 Quadriplegia, unspecified: Secondary | ICD-10-CM | POA: Diagnosis not present

## 2014-05-16 NOTE — Telephone Encounter (Signed)
Lattie Haw the nurse @ [facility] is calling needing orders for a social worker and physical therapy related to transfer and for a home health aide for bathing.  She also needs to speak with you regarding medication problems.  Please call and advise.

## 2014-05-18 DIAGNOSIS — M6250 Muscle wasting and atrophy, not elsewhere classified, unspecified site: Secondary | ICD-10-CM | POA: Diagnosis not present

## 2014-05-18 DIAGNOSIS — M6281 Muscle weakness (generalized): Secondary | ICD-10-CM | POA: Diagnosis not present

## 2014-05-18 DIAGNOSIS — G8250 Quadriplegia, unspecified: Secondary | ICD-10-CM | POA: Diagnosis not present

## 2014-05-18 DIAGNOSIS — L89322 Pressure ulcer of left buttock, stage 2: Secondary | ICD-10-CM | POA: Diagnosis not present

## 2014-05-18 DIAGNOSIS — I69398 Other sequelae of cerebral infarction: Secondary | ICD-10-CM | POA: Diagnosis not present

## 2014-05-18 DIAGNOSIS — B91 Sequelae of poliomyelitis: Secondary | ICD-10-CM | POA: Diagnosis not present

## 2014-05-21 NOTE — Telephone Encounter (Signed)
Spoke with Lattie Haw at [facility], she states that she is concerned for patient's well being, states that patient has a family friend that comes in during the day and transfers him without a lift, states that patient is unable to assist with transfers, also feels that the patient is not getting the proper care at home, wife works 10 hour days and he is changed in the mornings and again when she comes home from work. Lattie Haw would like to have an order for Medical Social Worker, [place], Physical therapy, and mechanical lift for patient, please advise.

## 2014-05-22 ENCOUNTER — Other Ambulatory Visit: Payer: Self-pay | Admitting: Neurology

## 2014-05-22 DIAGNOSIS — M6250 Muscle wasting and atrophy, not elsewhere classified, unspecified site: Secondary | ICD-10-CM | POA: Diagnosis not present

## 2014-05-22 DIAGNOSIS — I639 Cerebral infarction, unspecified: Secondary | ICD-10-CM

## 2014-05-22 DIAGNOSIS — L89322 Pressure ulcer of left buttock, stage 2: Secondary | ICD-10-CM | POA: Diagnosis not present

## 2014-05-22 DIAGNOSIS — I69398 Other sequelae of cerebral infarction: Secondary | ICD-10-CM | POA: Diagnosis not present

## 2014-05-22 DIAGNOSIS — M6281 Muscle weakness (generalized): Secondary | ICD-10-CM | POA: Diagnosis not present

## 2014-05-22 DIAGNOSIS — G8250 Quadriplegia, unspecified: Secondary | ICD-10-CM | POA: Diagnosis not present

## 2014-05-22 DIAGNOSIS — I610 Nontraumatic intracerebral hemorrhage in hemisphere, subcortical: Secondary | ICD-10-CM

## 2014-05-22 DIAGNOSIS — B91 Sequelae of poliomyelitis: Secondary | ICD-10-CM | POA: Diagnosis not present

## 2014-05-22 NOTE — Telephone Encounter (Signed)
Discussed with L[facility] @ [facility] and put in new orders for PT/OT/medical social worker/home health aide.  Rosalin Hawking, MD PhD Stroke Neurology 05/22/2014 10:18 AM

## 2014-05-25 DIAGNOSIS — M6250 Muscle wasting and atrophy, not elsewhere classified, unspecified site: Secondary | ICD-10-CM | POA: Diagnosis not present

## 2014-05-25 DIAGNOSIS — M6281 Muscle weakness (generalized): Secondary | ICD-10-CM | POA: Diagnosis not present

## 2014-05-25 DIAGNOSIS — G8250 Quadriplegia, unspecified: Secondary | ICD-10-CM | POA: Diagnosis not present

## 2014-05-25 DIAGNOSIS — I69398 Other sequelae of cerebral infarction: Secondary | ICD-10-CM | POA: Diagnosis not present

## 2014-05-25 DIAGNOSIS — L89322 Pressure ulcer of left buttock, stage 2: Secondary | ICD-10-CM | POA: Diagnosis not present

## 2014-05-25 DIAGNOSIS — B91 Sequelae of poliomyelitis: Secondary | ICD-10-CM | POA: Diagnosis not present

## 2014-05-28 DIAGNOSIS — M6281 Muscle weakness (generalized): Secondary | ICD-10-CM | POA: Diagnosis not present

## 2014-05-28 DIAGNOSIS — B91 Sequelae of poliomyelitis: Secondary | ICD-10-CM | POA: Diagnosis not present

## 2014-05-28 DIAGNOSIS — G8250 Quadriplegia, unspecified: Secondary | ICD-10-CM | POA: Diagnosis not present

## 2014-05-28 DIAGNOSIS — M6250 Muscle wasting and atrophy, not elsewhere classified, unspecified site: Secondary | ICD-10-CM | POA: Diagnosis not present

## 2014-05-28 DIAGNOSIS — I69398 Other sequelae of cerebral infarction: Secondary | ICD-10-CM | POA: Diagnosis not present

## 2014-05-28 DIAGNOSIS — L89322 Pressure ulcer of left buttock, stage 2: Secondary | ICD-10-CM | POA: Diagnosis not present

## 2014-05-31 DIAGNOSIS — I10 Essential (primary) hypertension: Secondary | ICD-10-CM | POA: Diagnosis not present

## 2014-05-31 DIAGNOSIS — R05 Cough: Secondary | ICD-10-CM | POA: Diagnosis not present

## 2014-05-31 DIAGNOSIS — F329 Major depressive disorder, single episode, unspecified: Secondary | ICD-10-CM | POA: Diagnosis not present

## 2014-05-31 DIAGNOSIS — G8250 Quadriplegia, unspecified: Secondary | ICD-10-CM | POA: Diagnosis not present

## 2014-05-31 DIAGNOSIS — I616 Nontraumatic intracerebral hemorrhage, multiple localized: Secondary | ICD-10-CM | POA: Diagnosis not present

## 2014-05-31 DIAGNOSIS — K219 Gastro-esophageal reflux disease without esophagitis: Secondary | ICD-10-CM | POA: Diagnosis not present

## 2014-05-31 DIAGNOSIS — H109 Unspecified conjunctivitis: Secondary | ICD-10-CM | POA: Diagnosis not present

## 2014-06-01 ENCOUNTER — Encounter: Payer: Self-pay | Admitting: Gastroenterology

## 2014-06-03 DIAGNOSIS — L89322 Pressure ulcer of left buttock, stage 2: Secondary | ICD-10-CM | POA: Diagnosis not present

## 2014-06-03 DIAGNOSIS — M6250 Muscle wasting and atrophy, not elsewhere classified, unspecified site: Secondary | ICD-10-CM | POA: Diagnosis not present

## 2014-06-03 DIAGNOSIS — B91 Sequelae of poliomyelitis: Secondary | ICD-10-CM | POA: Diagnosis not present

## 2014-06-03 DIAGNOSIS — I69398 Other sequelae of cerebral infarction: Secondary | ICD-10-CM | POA: Diagnosis not present

## 2014-06-03 DIAGNOSIS — G8250 Quadriplegia, unspecified: Secondary | ICD-10-CM | POA: Diagnosis not present

## 2014-06-03 DIAGNOSIS — M6281 Muscle weakness (generalized): Secondary | ICD-10-CM | POA: Diagnosis not present

## 2014-06-04 DIAGNOSIS — G8250 Quadriplegia, unspecified: Secondary | ICD-10-CM | POA: Diagnosis not present

## 2014-06-04 DIAGNOSIS — L89322 Pressure ulcer of left buttock, stage 2: Secondary | ICD-10-CM | POA: Diagnosis not present

## 2014-06-04 DIAGNOSIS — B91 Sequelae of poliomyelitis: Secondary | ICD-10-CM | POA: Diagnosis not present

## 2014-06-04 DIAGNOSIS — I69398 Other sequelae of cerebral infarction: Secondary | ICD-10-CM | POA: Diagnosis not present

## 2014-06-04 DIAGNOSIS — M6250 Muscle wasting and atrophy, not elsewhere classified, unspecified site: Secondary | ICD-10-CM | POA: Diagnosis not present

## 2014-06-04 DIAGNOSIS — M6281 Muscle weakness (generalized): Secondary | ICD-10-CM | POA: Diagnosis not present

## 2014-06-10 DIAGNOSIS — G8250 Quadriplegia, unspecified: Secondary | ICD-10-CM | POA: Diagnosis not present

## 2014-06-10 DIAGNOSIS — B91 Sequelae of poliomyelitis: Secondary | ICD-10-CM | POA: Diagnosis not present

## 2014-06-10 DIAGNOSIS — I69398 Other sequelae of cerebral infarction: Secondary | ICD-10-CM | POA: Diagnosis not present

## 2014-06-10 DIAGNOSIS — M6281 Muscle weakness (generalized): Secondary | ICD-10-CM | POA: Diagnosis not present

## 2014-06-10 DIAGNOSIS — L89322 Pressure ulcer of left buttock, stage 2: Secondary | ICD-10-CM | POA: Diagnosis not present

## 2014-06-10 DIAGNOSIS — M6250 Muscle wasting and atrophy, not elsewhere classified, unspecified site: Secondary | ICD-10-CM | POA: Diagnosis not present

## 2014-06-12 DIAGNOSIS — M6281 Muscle weakness (generalized): Secondary | ICD-10-CM | POA: Diagnosis not present

## 2014-06-12 DIAGNOSIS — I69398 Other sequelae of cerebral infarction: Secondary | ICD-10-CM | POA: Diagnosis not present

## 2014-06-12 DIAGNOSIS — G8250 Quadriplegia, unspecified: Secondary | ICD-10-CM | POA: Diagnosis not present

## 2014-06-12 DIAGNOSIS — B91 Sequelae of poliomyelitis: Secondary | ICD-10-CM | POA: Diagnosis not present

## 2014-06-12 DIAGNOSIS — M6250 Muscle wasting and atrophy, not elsewhere classified, unspecified site: Secondary | ICD-10-CM | POA: Diagnosis not present

## 2014-06-12 DIAGNOSIS — L89322 Pressure ulcer of left buttock, stage 2: Secondary | ICD-10-CM | POA: Diagnosis not present

## 2014-06-28 DIAGNOSIS — I69398 Other sequelae of cerebral infarction: Secondary | ICD-10-CM | POA: Diagnosis not present

## 2014-06-28 DIAGNOSIS — M6250 Muscle wasting and atrophy, not elsewhere classified, unspecified site: Secondary | ICD-10-CM | POA: Diagnosis not present

## 2014-06-28 DIAGNOSIS — L89322 Pressure ulcer of left buttock, stage 2: Secondary | ICD-10-CM | POA: Diagnosis not present

## 2014-06-28 DIAGNOSIS — B91 Sequelae of poliomyelitis: Secondary | ICD-10-CM | POA: Diagnosis not present

## 2014-06-28 DIAGNOSIS — M6281 Muscle weakness (generalized): Secondary | ICD-10-CM | POA: Diagnosis not present

## 2014-06-28 DIAGNOSIS — G8250 Quadriplegia, unspecified: Secondary | ICD-10-CM | POA: Diagnosis not present

## 2014-07-01 ENCOUNTER — Telehealth: Payer: Self-pay | Admitting: Neurology

## 2014-07-01 DIAGNOSIS — M6250 Muscle wasting and atrophy, not elsewhere classified, unspecified site: Secondary | ICD-10-CM | POA: Diagnosis not present

## 2014-07-01 DIAGNOSIS — B91 Sequelae of poliomyelitis: Secondary | ICD-10-CM | POA: Diagnosis not present

## 2014-07-01 DIAGNOSIS — G8250 Quadriplegia, unspecified: Secondary | ICD-10-CM | POA: Diagnosis not present

## 2014-07-01 DIAGNOSIS — L89322 Pressure ulcer of left buttock, stage 2: Secondary | ICD-10-CM | POA: Diagnosis not present

## 2014-07-01 DIAGNOSIS — I69398 Other sequelae of cerebral infarction: Secondary | ICD-10-CM | POA: Diagnosis not present

## 2014-07-01 DIAGNOSIS — M6281 Muscle weakness (generalized): Secondary | ICD-10-CM | POA: Diagnosis not present

## 2014-07-01 NOTE — Telephone Encounter (Signed)
[facility] is calling wanting to know the current medication pt is on.  Please call and advise.

## 2014-07-01 NOTE — Telephone Encounter (Signed)
[name] at [facility] and informed her of patient's medications. Also faxed over medication list to [facility].

## 2014-07-03 DIAGNOSIS — G8250 Quadriplegia, unspecified: Secondary | ICD-10-CM | POA: Diagnosis not present

## 2014-07-03 DIAGNOSIS — I69398 Other sequelae of cerebral infarction: Secondary | ICD-10-CM | POA: Diagnosis not present

## 2014-07-03 DIAGNOSIS — M6281 Muscle weakness (generalized): Secondary | ICD-10-CM | POA: Diagnosis not present

## 2014-07-03 DIAGNOSIS — B91 Sequelae of poliomyelitis: Secondary | ICD-10-CM | POA: Diagnosis not present

## 2014-07-03 DIAGNOSIS — M6250 Muscle wasting and atrophy, not elsewhere classified, unspecified site: Secondary | ICD-10-CM | POA: Diagnosis not present

## 2014-07-03 DIAGNOSIS — L89322 Pressure ulcer of left buttock, stage 2: Secondary | ICD-10-CM | POA: Diagnosis not present

## 2014-07-06 DIAGNOSIS — M6281 Muscle weakness (generalized): Secondary | ICD-10-CM | POA: Diagnosis not present

## 2014-07-06 DIAGNOSIS — M6250 Muscle wasting and atrophy, not elsewhere classified, unspecified site: Secondary | ICD-10-CM | POA: Diagnosis not present

## 2014-07-06 DIAGNOSIS — I69398 Other sequelae of cerebral infarction: Secondary | ICD-10-CM | POA: Diagnosis not present

## 2014-07-06 DIAGNOSIS — B91 Sequelae of poliomyelitis: Secondary | ICD-10-CM | POA: Diagnosis not present

## 2014-07-06 DIAGNOSIS — L89322 Pressure ulcer of left buttock, stage 2: Secondary | ICD-10-CM | POA: Diagnosis not present

## 2014-07-06 DIAGNOSIS — G8250 Quadriplegia, unspecified: Secondary | ICD-10-CM | POA: Diagnosis not present

## 2014-07-12 DIAGNOSIS — M6250 Muscle wasting and atrophy, not elsewhere classified, unspecified site: Secondary | ICD-10-CM | POA: Diagnosis not present

## 2014-07-12 DIAGNOSIS — L89322 Pressure ulcer of left buttock, stage 2: Secondary | ICD-10-CM | POA: Diagnosis not present

## 2014-07-12 DIAGNOSIS — G8250 Quadriplegia, unspecified: Secondary | ICD-10-CM | POA: Diagnosis not present

## 2014-07-12 DIAGNOSIS — I69398 Other sequelae of cerebral infarction: Secondary | ICD-10-CM | POA: Diagnosis not present

## 2014-07-12 DIAGNOSIS — B91 Sequelae of poliomyelitis: Secondary | ICD-10-CM | POA: Diagnosis not present

## 2014-07-12 DIAGNOSIS — M6281 Muscle weakness (generalized): Secondary | ICD-10-CM | POA: Diagnosis not present

## 2014-07-15 DIAGNOSIS — G8250 Quadriplegia, unspecified: Secondary | ICD-10-CM | POA: Diagnosis not present

## 2014-07-15 DIAGNOSIS — F329 Major depressive disorder, single episode, unspecified: Secondary | ICD-10-CM | POA: Diagnosis not present

## 2014-07-15 DIAGNOSIS — I69398 Other sequelae of cerebral infarction: Secondary | ICD-10-CM | POA: Diagnosis not present

## 2014-07-15 DIAGNOSIS — M6250 Muscle wasting and atrophy, not elsewhere classified, unspecified site: Secondary | ICD-10-CM | POA: Diagnosis not present

## 2014-07-15 DIAGNOSIS — M6281 Muscle weakness (generalized): Secondary | ICD-10-CM | POA: Diagnosis not present

## 2014-07-15 DIAGNOSIS — B91 Sequelae of poliomyelitis: Secondary | ICD-10-CM | POA: Diagnosis not present

## 2014-07-15 DIAGNOSIS — F419 Anxiety disorder, unspecified: Secondary | ICD-10-CM | POA: Diagnosis not present

## 2014-07-20 DIAGNOSIS — B91 Sequelae of poliomyelitis: Secondary | ICD-10-CM | POA: Diagnosis not present

## 2014-07-20 DIAGNOSIS — F329 Major depressive disorder, single episode, unspecified: Secondary | ICD-10-CM | POA: Diagnosis not present

## 2014-07-20 DIAGNOSIS — G8250 Quadriplegia, unspecified: Secondary | ICD-10-CM | POA: Diagnosis not present

## 2014-07-20 DIAGNOSIS — I69398 Other sequelae of cerebral infarction: Secondary | ICD-10-CM | POA: Diagnosis not present

## 2014-07-20 DIAGNOSIS — M6281 Muscle weakness (generalized): Secondary | ICD-10-CM | POA: Diagnosis not present

## 2014-07-20 DIAGNOSIS — M6250 Muscle wasting and atrophy, not elsewhere classified, unspecified site: Secondary | ICD-10-CM | POA: Diagnosis not present

## 2014-07-26 DIAGNOSIS — I69398 Other sequelae of cerebral infarction: Secondary | ICD-10-CM | POA: Diagnosis not present

## 2014-07-26 DIAGNOSIS — B91 Sequelae of poliomyelitis: Secondary | ICD-10-CM | POA: Diagnosis not present

## 2014-07-26 DIAGNOSIS — M6281 Muscle weakness (generalized): Secondary | ICD-10-CM | POA: Diagnosis not present

## 2014-07-26 DIAGNOSIS — F329 Major depressive disorder, single episode, unspecified: Secondary | ICD-10-CM | POA: Diagnosis not present

## 2014-07-26 DIAGNOSIS — M6250 Muscle wasting and atrophy, not elsewhere classified, unspecified site: Secondary | ICD-10-CM | POA: Diagnosis not present

## 2014-07-26 DIAGNOSIS — G8250 Quadriplegia, unspecified: Secondary | ICD-10-CM | POA: Diagnosis not present

## 2014-08-02 DIAGNOSIS — R05 Cough: Secondary | ICD-10-CM | POA: Diagnosis not present

## 2014-08-02 DIAGNOSIS — G8250 Quadriplegia, unspecified: Secondary | ICD-10-CM | POA: Diagnosis not present

## 2014-08-02 DIAGNOSIS — I616 Nontraumatic intracerebral hemorrhage, multiple localized: Secondary | ICD-10-CM | POA: Diagnosis not present

## 2014-08-02 DIAGNOSIS — F329 Major depressive disorder, single episode, unspecified: Secondary | ICD-10-CM | POA: Diagnosis not present

## 2014-08-02 DIAGNOSIS — Z79899 Other long term (current) drug therapy: Secondary | ICD-10-CM | POA: Diagnosis not present

## 2014-08-02 DIAGNOSIS — K219 Gastro-esophageal reflux disease without esophagitis: Secondary | ICD-10-CM | POA: Diagnosis not present

## 2014-08-02 DIAGNOSIS — I10 Essential (primary) hypertension: Secondary | ICD-10-CM | POA: Diagnosis not present

## 2014-08-02 DIAGNOSIS — L22 Diaper dermatitis: Secondary | ICD-10-CM | POA: Diagnosis not present

## 2014-08-03 DIAGNOSIS — I69398 Other sequelae of cerebral infarction: Secondary | ICD-10-CM | POA: Diagnosis not present

## 2014-08-03 DIAGNOSIS — B91 Sequelae of poliomyelitis: Secondary | ICD-10-CM | POA: Diagnosis not present

## 2014-08-03 DIAGNOSIS — F329 Major depressive disorder, single episode, unspecified: Secondary | ICD-10-CM | POA: Diagnosis not present

## 2014-08-03 DIAGNOSIS — G8250 Quadriplegia, unspecified: Secondary | ICD-10-CM | POA: Diagnosis not present

## 2014-08-03 DIAGNOSIS — M6281 Muscle weakness (generalized): Secondary | ICD-10-CM | POA: Diagnosis not present

## 2014-08-03 DIAGNOSIS — M6250 Muscle wasting and atrophy, not elsewhere classified, unspecified site: Secondary | ICD-10-CM | POA: Diagnosis not present

## 2014-08-09 DIAGNOSIS — F329 Major depressive disorder, single episode, unspecified: Secondary | ICD-10-CM | POA: Diagnosis not present

## 2014-08-09 DIAGNOSIS — M6281 Muscle weakness (generalized): Secondary | ICD-10-CM | POA: Diagnosis not present

## 2014-08-09 DIAGNOSIS — M6250 Muscle wasting and atrophy, not elsewhere classified, unspecified site: Secondary | ICD-10-CM | POA: Diagnosis not present

## 2014-08-09 DIAGNOSIS — G8250 Quadriplegia, unspecified: Secondary | ICD-10-CM | POA: Diagnosis not present

## 2014-08-09 DIAGNOSIS — I69398 Other sequelae of cerebral infarction: Secondary | ICD-10-CM | POA: Diagnosis not present

## 2014-08-09 DIAGNOSIS — B91 Sequelae of poliomyelitis: Secondary | ICD-10-CM | POA: Diagnosis not present

## 2014-08-13 ENCOUNTER — Ambulatory Visit: Payer: Medicare Other | Admitting: Neurology

## 2014-08-14 ENCOUNTER — Encounter: Payer: Self-pay | Admitting: Neurology

## 2014-08-16 DIAGNOSIS — G8250 Quadriplegia, unspecified: Secondary | ICD-10-CM | POA: Diagnosis not present

## 2014-08-16 DIAGNOSIS — M6281 Muscle weakness (generalized): Secondary | ICD-10-CM | POA: Diagnosis not present

## 2014-08-16 DIAGNOSIS — B91 Sequelae of poliomyelitis: Secondary | ICD-10-CM | POA: Diagnosis not present

## 2014-08-16 DIAGNOSIS — M6250 Muscle wasting and atrophy, not elsewhere classified, unspecified site: Secondary | ICD-10-CM | POA: Diagnosis not present

## 2014-08-16 DIAGNOSIS — I69398 Other sequelae of cerebral infarction: Secondary | ICD-10-CM | POA: Diagnosis not present

## 2014-08-16 DIAGNOSIS — F329 Major depressive disorder, single episode, unspecified: Secondary | ICD-10-CM | POA: Diagnosis not present

## 2014-10-10 ENCOUNTER — Other Ambulatory Visit: Payer: Self-pay | Admitting: Internal Medicine

## 2014-10-10 ENCOUNTER — Ambulatory Visit
Admission: RE | Admit: 2014-10-10 | Discharge: 2014-10-10 | Disposition: A | Discharge status: Home / Self Care | Payer: Medicare Other | Source: Ambulatory Visit | Attending: Internal Medicine | Admitting: Internal Medicine

## 2014-10-10 DIAGNOSIS — L89523 Pressure ulcer of left ankle, stage 3: Secondary | ICD-10-CM

## 2014-10-10 DIAGNOSIS — J209 Acute bronchitis, unspecified: Secondary | ICD-10-CM | POA: Diagnosis not present

## 2014-10-10 DIAGNOSIS — L89529 Pressure ulcer of left ankle, unspecified stage: Secondary | ICD-10-CM | POA: Diagnosis not present

## 2014-10-10 DIAGNOSIS — L89302 Pressure ulcer of unspecified buttock, stage 2: Secondary | ICD-10-CM | POA: Diagnosis not present

## 2014-10-13 ENCOUNTER — Other Ambulatory Visit: Payer: Self-pay | Admitting: Neurology

## 2014-10-13 DIAGNOSIS — G8250 Quadriplegia, unspecified: Secondary | ICD-10-CM | POA: Diagnosis not present

## 2014-10-13 DIAGNOSIS — Z720 Tobacco use: Secondary | ICD-10-CM | POA: Diagnosis not present

## 2014-10-13 DIAGNOSIS — L89523 Pressure ulcer of left ankle, stage 3: Secondary | ICD-10-CM | POA: Diagnosis not present

## 2014-10-13 DIAGNOSIS — I10 Essential (primary) hypertension: Secondary | ICD-10-CM | POA: Diagnosis not present

## 2014-10-13 DIAGNOSIS — R1310 Dysphagia, unspecified: Secondary | ICD-10-CM | POA: Diagnosis not present

## 2014-10-13 DIAGNOSIS — F329 Major depressive disorder, single episode, unspecified: Secondary | ICD-10-CM | POA: Diagnosis not present

## 2014-10-13 DIAGNOSIS — Z8673 Personal history of transient ischemic attack (TIA), and cerebral infarction without residual deficits: Secondary | ICD-10-CM | POA: Diagnosis not present

## 2014-10-13 DIAGNOSIS — L89322 Pressure ulcer of left buttock, stage 2: Secondary | ICD-10-CM | POA: Diagnosis not present

## 2014-10-13 DIAGNOSIS — G894 Chronic pain syndrome: Secondary | ICD-10-CM | POA: Diagnosis not present

## 2014-10-13 NOTE — Telephone Encounter (Signed)
Patient has appt scheduled

## 2014-11-19 ENCOUNTER — Encounter: Payer: Self-pay | Admitting: Neurology

## 2014-11-19 ENCOUNTER — Ambulatory Visit (INDEPENDENT_AMBULATORY_CARE_PROVIDER_SITE_OTHER): Payer: Medicare Other | Admitting: Neurology

## 2014-11-19 VITALS — BP 165/90 | HR 61 | Ht 65.0 in | Wt 115.0 lb

## 2014-11-19 DIAGNOSIS — I639 Cerebral infarction, unspecified: Secondary | ICD-10-CM

## 2014-11-19 DIAGNOSIS — I610 Nontraumatic intracerebral hemorrhage in hemisphere, subcortical: Secondary | ICD-10-CM | POA: Diagnosis not present

## 2014-11-19 DIAGNOSIS — I10 Essential (primary) hypertension: Secondary | ICD-10-CM | POA: Diagnosis not present

## 2014-11-19 NOTE — Patient Instructions (Signed)
-   continue ASA and lipitor for stroke prevention.  - check BP at home and record and bring over to PCP on next visit. goal 120-140. BP better than before, but still on the high side - also discuss with Dr. Inda Merlin for increased sputum. - Follow up with your primary care physician next week for stroke risk factor modification. Recommend maintain blood pressure goal <130/80, diabetes with hemoglobin A1c goal below 6.5% and lipids with LDL cholesterol goal below 70 mg/dL.  - follow up in 6 months.

## 2014-11-19 NOTE — Progress Notes (Signed)
STROKE NEUROLOGY FOLLOW UP NOTE  NAME: [name] DOB: June 10, 1950  REASON FOR VISIT: stroke follow up HISTORY FROM: from chart  Today we had the pleasure of seeing [name] in follow-up at our [facility]. [name] was accompanied by wife and interpreter.   History Summary [name] is a 64 y.o. 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 Per daughter [name] was able to walk with assistance of cain but over the few days prior to admission [name] had been so weak [name] could not walk. [name] has polio resulting in chronic right sided weakness but [name] felt [name] was definitely weaker than usual. Per daughter [name] takes his BP medications only when [name] feels ill but not when [name] is feeling good--same with ASA. in the ED [name] was noted to have BP 240/108, 235/111, 264/140. [name] was admitted to the medical ICU for further evaluation and treatment. His blood pressure was gradually controlled and [name] has remained neurologically stable. [name] also has a remote history of right medial thalamic intracerebral hemorrhage in 2012 from which [name] apparently made good recovery. [name] has history of childhood polio with significant deformities in both feet and hands but at baseline is able to ambulate with a cane. [name] was discharged letter to rehab.  10/25/13 follow up -  the [name] has been doing stable. [name] came from rehab for the visit today with his daughter. His BP 147/90 today a little higher than goal. [name] has been taking clonidine, amlodipine, lisinopril for BP control. [name] still has some left UE weakness but LE seems improved much. [name] still has right sided weakness but more on the UE, not consistent with polio but more due to previous stroke.  01/02/14 follow up - [name] was discharge from rehab to home 2 weeks ago. Lives with wife, and wife is taking care his meds. [name] does not know what medication [name] is taking and does not whether [name] has PCP to follow. [name] stated that [name]  does not have much change since last visit. His BP was 167/85 today in clinic. Called his daughter and she does not know much after [name]'s medication Also called his wife and she can not remember the meds but she said she can check when she gets home tonight. She stated that [name] seems not on ASA which was prescribed to him last visit. During the visit, [name] had multiple crying spells, but [name] denies any sadness. His wife also confirmed that [name] has frequent crying spells at home also.  05/09/14 follow up - [name] has been doing the same. Currently, [name] is wheelchair-bound with right sided weakness. His wife has to work 10 hours per day, so [name] was left alone at home with diapers, and his friends come over to feed him during meal hours. They are requesting home health.  [name] has established PCP, however, the PCP information has not been updated in the system. [name] is taking amlodipine 5 mg, clonidine 0.1 mg twice an day, losartan 50 mg daily for blood pressure control. However, his blood pressure today in clinic 193/94. Wife stated that at home his blood pressure around 160. [name] is on aspirin and Lipitor for stroke prevention at home now. However last LDL checked was 108. [name] still has multiple crying spells every day, since better than before after Zoloft. [name] has PCP with his next week.  Interval History During the interval time, [name]  has some improvement. [name] does not have any crying spells anymore. [name] had been on Neudextra for about 2-3 months and then stopped as per wife. BP is better than before but still on the high side. Today 165/90. [name] has been following with Dr. Inda Merlin for BP management. [name] has appointment with him next month. [name] is basically at home wheelchair or bedbound. [name] does not do any exercise at home and finished off home [name]/OT/nursing aide. [name] has damper today but at home use condom catheter. His muscle strength has worsened comparing with before due to lack of use.   The following represents the  [name]'s updated allergies and side effects list: No Known Allergies  Labs since last visit of relevance include the following: Results for orders placed or performed in visit on 01/02/14  Lipid panel  Result Value Ref Range   Cholesterol, Total 175 100 - 199 mg/dL   Triglycerides 195 (H) 0 - 149 mg/dL   HDL 28 (L) >39 mg/dL   VLDL Cholesterol Cal 39 5 - 40 mg/dL   LDL Calculated 108 (H) 0 - 99 mg/dL   Chol/HDL Ratio 6.3 (H) 0.0 - 5.0 ratio units    The neurologically relevant items on the [name]'s problem list were reviewed on today's visit.  Neurologic Examination  A problem focused neurological exam (12 or more points of the single system neurologic examination, vital signs counts as 1 point, cranial nerves count for 8 points) was performed.  Blood pressure 165/90, pulse 61, height 5\' 5"  (1.651 m), weight 115 lb (52.164 kg).  General - thin, well developed, in no apparent distress, pleasant, limited english.  Ophthalmologic - not able to see through.  Cardiovascular - Regular rate and rhythm with no murmur.  Mental Status -  Level of arousal and orientation to place, and person were intact, but not to time. Language including expression, naming, repetition, comprehension was tried to assess but not successful due to limited [place] but as per daughter, [name] is speaking vietnamese no problem.   Cranial Nerves II - XII - II - Vision intact OU. III, IV, VI - Extraocular movements intact. V - Facial sensation intact bilaterally. VII - Facial movement intact bilaterally. VIII - Hearing & vestibular intact bilaterally. X - Palate elevates symmetrically. XI - Chin turning & shoulder shrug intact bilaterally. XII - Tongue protrusion intact.  Motor Strength - The [name]'s strength was 0/5 RUE and RLE. Left 0/5 shoulder, 3/5 bicep and tricep and 4+/5 finger grip, LLE 3-/5 proximal and 0/5 distally.  Motor Tone - Muscle tone was assessed at the neck and appendages and was  normal.  Reflexes - The [name]'s reflexes were decreased in all extremities and [name] had no pathological reflexes.  Sensory - Light touch, temperature/pinprick were assessed and were symmetrical.    Coordination - not able to perform.  Tremor was absent.  Gait and Station - not able to test.  Data reviewed: I personally reviewed the images and agree with the radiology interpretations.  11/18/2010 MRI -  1.  Stable hemorrhagic infarct of the medial right thalamus with mass effect extending into the third ventricle but no intraventricular hemorrhage. 2.  More remote punctate hemorrhage in the posterior right thalamus. 3.  Multiple remote lacunar infarcts involving the basal ganglia bilaterally, corpus callosum, and the cerebellum. 4.  Extensive white matter disease. MRA -  1.  Extensive small vessel disease. 2.  Moderate stenosis of the distal right M1 segment with asymmetric signal loss in the right  greater than left MCA branch vessels. 3.  Potential high-grade stenosis of the proximal left A1 segment. CT head -   1.  Small right medial thalamic hematoma, likely a hypertensive bleed. 2.  Mass effect on the third ventricle but no hydrocephalus. 3.  Multiple remote appearing lacunar type infarcts  08/23/2013 MRI -  1. Similar appearance of right basal ganglia hemorrhage, compatible with acute hemorrhagic infarct related to history of malignant hypertension. 2. Additional multi focal ischemic infarcts involving the bilateral parietal and temporal lobes as well as the right occipital lobe as above. 3. Remote infarcts involving the left basal ganglia/left corona radiata and right middle cerebellar peduncle. 4. Atrophy with advanced chronic microvascular ischemic disease. CT head - A 1.5 x 1.6 cm intraparenchymal hemorrhage within the right basal ganglial region. 2D echo - - Left ventricle: The cavity size was normal. There was moderate concentric hypertrophy. Systolic function  was vigorous. The estimated ejection fraction was 75%, in the range of 65% to 70%. Wall motion was normal; there were no regional wall motion abnormalities. Doppler parameters are consistent with abnormal left ventricular relaxation (grade 1 diastolic dysfunction). - Aortic valve: Trileaflet; normal thickness leaflets. No regurgitation. - Mitral valve: No regurgitation. - Right ventricle: Systolic function was normal. - Right atrium: The atrium was normal in size. - Tricuspid valve: No regurgitation. - Pulmonary arteries: Systolic pressure was within the normal range. - Pericardium, extracardiac: There was no pericardial effusion. Carotid doppler The vertebral arteries appear patent with antegrade flow. - Findings consistent with 1-39 percent stenosis involving the right internal carotid artery and the left internal carotid artery.  Component     Latest Ref Rng 08/23/2013  Cholesterol     0 - 200 mg/dL 157  Triglycerides     <150 mg/dL 168 (H)  HDL     >39 mg/dL 34 (L)  Total CHOL/HDL Ratio      4.6  VLDL     0 - 40 mg/dL 34  LDL (calc)     0 - 99 mg/dL 89    Assessment: As you may recall, [name] is a 64 y.o.  Asian male with a diagnosis of right BG bleeding. [name] also had right thalamic bleeding in 2012 but with minimal deficit residue. [name] also have multiple bilateral lacunar strokes in the MRI. 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 However, [name] did have multiple cortical punctate strokes found on current MRI which also raise concern of embolic stroke. His carotid doppler seems not a concern and cardioembolic will need to be considered. But I will hold off any further testing include TCD MES detection, cardionet or loop recorder due to [name] is not a candidate for anticoagulation therapy. [name] is on ASA and low dose lipitor. His blood pressure is still not in good control, and [name] has PCP to follow up next month. Pseudobulbar effect much improved. However,  his muscle strength is worse than before due to lack of use and currently [name] is wheelchair or bedbound.   Plan:  - continue ASA and lipitor for stroke prevention.  - check BP at home and goal 120-140. BP better than before, but still on the high side - also discuss with Dr. Inda Merlin for increased phlegm.  - encourage home exercise.  - Follow up with Dr. Inda Merlin (PCP) for stroke risk factor modification. Recommend maintain blood pressure goal <130/80, diabetes with hemoglobin A1c goal below 6.5% and lipids with LDL cholesterol goal below 70 mg/dL.  -  RTC in 6 months.  Meds ordered this encounter  Medications  . atorvastatin (LIPITOR) 10 MG tablet    Sig: TK 1 T PO ONCE A DAY    Refill:  3  . azithromycin (ZITHROMAX) 250 MG tablet    Sig:     Refill:  0  . NEXIUM 40 MG capsule    Sig:     Refill:  1   No orders of the defined types were placed in this encounter.   [name] Instructions  - continue ASA and lipitor for stroke prevention.  - check BP at home and record and bring over to PCP on next visit. goal 120-140. BP better than before, but still on the high side - also discuss with Dr. Inda Merlin for increased sputum. - Follow up with your primary care physician next week for stroke risk factor modification. Recommend maintain blood pressure goal <130/80, diabetes with hemoglobin A1c goal below 6.5% and lipids with LDL cholesterol goal below 70 mg/dL.  - follow up in 6 months.   Rosalin Hawking, MD PhD [facility] [address], [place], [place] [address] [phone]

## 2014-12-05 ENCOUNTER — Telehealth: Payer: Self-pay

## 2014-12-05 NOTE — Telephone Encounter (Signed)
Rn unable to leave message with wife [name] about her FMLA forms for her husband. Nurse will try to call pt before the end of the business day. The patients home number was not valid. Nurse needs more information from the patients wife to filled the FMLA form out.

## 2015-01-09 ENCOUNTER — Other Ambulatory Visit: Payer: Self-pay | Admitting: Neurology

## 2015-01-29 NOTE — Telephone Encounter (Signed)
Rn tried calling patients wife home number and it was disconnected. Rn was inquiring about FMLA forms for her husband. The pt wife did not pay 25.00 and there is not a release form filled out by her husband to have it sent to her job.

## 2015-01-29 NOTE — Telephone Encounter (Signed)
Rn tried calling pts wife cell phone and it was disconnected. Nurse will send a letter to patient and wife.

## 2015-02-06 DIAGNOSIS — R32 Unspecified urinary incontinence: Secondary | ICD-10-CM | POA: Diagnosis not present

## 2015-02-06 DIAGNOSIS — Z79899 Other long term (current) drug therapy: Secondary | ICD-10-CM | POA: Diagnosis not present

## 2015-02-06 DIAGNOSIS — L89302 Pressure ulcer of unspecified buttock, stage 2: Secondary | ICD-10-CM | POA: Diagnosis not present

## 2015-02-06 DIAGNOSIS — L89523 Pressure ulcer of left ankle, stage 3: Secondary | ICD-10-CM | POA: Diagnosis not present

## 2015-02-06 DIAGNOSIS — I616 Nontraumatic intracerebral hemorrhage, multiple localized: Secondary | ICD-10-CM | POA: Diagnosis not present

## 2015-02-06 DIAGNOSIS — L22 Diaper dermatitis: Secondary | ICD-10-CM | POA: Diagnosis not present

## 2015-02-06 DIAGNOSIS — K219 Gastro-esophageal reflux disease without esophagitis: Secondary | ICD-10-CM | POA: Diagnosis not present

## 2015-02-06 DIAGNOSIS — I10 Essential (primary) hypertension: Secondary | ICD-10-CM | POA: Diagnosis not present

## 2015-02-06 DIAGNOSIS — F329 Major depressive disorder, single episode, unspecified: Secondary | ICD-10-CM | POA: Diagnosis not present

## 2015-02-06 DIAGNOSIS — G8250 Quadriplegia, unspecified: Secondary | ICD-10-CM | POA: Diagnosis not present

## 2015-02-06 DIAGNOSIS — E785 Hyperlipidemia, unspecified: Secondary | ICD-10-CM | POA: Diagnosis not present

## 2015-03-01 ENCOUNTER — Other Ambulatory Visit: Payer: Self-pay | Admitting: Neurology

## 2015-03-05 DIAGNOSIS — G8250 Quadriplegia, unspecified: Secondary | ICD-10-CM | POA: Diagnosis not present

## 2015-03-05 DIAGNOSIS — N4829 Other inflammatory disorders of penis: Secondary | ICD-10-CM | POA: Diagnosis not present

## 2015-03-05 DIAGNOSIS — I10 Essential (primary) hypertension: Secondary | ICD-10-CM | POA: Diagnosis not present

## 2015-03-05 DIAGNOSIS — L909 Atrophic disorder of skin, unspecified: Secondary | ICD-10-CM | POA: Diagnosis not present

## 2015-05-01 ENCOUNTER — Other Ambulatory Visit: Payer: Self-pay | Admitting: Neurology

## 2015-05-16 ENCOUNTER — Other Ambulatory Visit: Payer: Self-pay | Admitting: Neurology

## 2015-05-19 ENCOUNTER — Other Ambulatory Visit: Payer: Self-pay

## 2015-05-19 DIAGNOSIS — I10 Essential (primary) hypertension: Secondary | ICD-10-CM

## 2015-05-19 DIAGNOSIS — I610 Nontraumatic intracerebral hemorrhage in hemisphere, subcortical: Secondary | ICD-10-CM

## 2015-05-19 MED ORDER — ATORVASTATIN CALCIUM 20 MG PO TABS: 10.0000 mg | ORAL_TABLET | Freq: Every day | ORAL | Status: DC

## 2015-05-19 MED ORDER — LOSARTAN POTASSIUM 100 MG PO TABS: 50.0000 mg | ORAL_TABLET | Freq: Every day | ORAL | Status: DC

## 2015-05-22 ENCOUNTER — Ambulatory Visit: Payer: Medicare Other | Admitting: Neurology

## 2015-05-23 ENCOUNTER — Encounter: Payer: Self-pay | Admitting: Neurology

## 2015-06-16 ENCOUNTER — Ambulatory Visit: Payer: Medicare Other | Admitting: Neurology

## 2015-06-16 ENCOUNTER — Ambulatory Visit (INDEPENDENT_AMBULATORY_CARE_PROVIDER_SITE_OTHER): Payer: Medicare Other | Admitting: Neurology

## 2015-06-16 ENCOUNTER — Encounter: Payer: Self-pay | Admitting: Neurology

## 2015-06-16 VITALS — BP 173/93 | HR 65 | Ht 65.0 in

## 2015-06-16 DIAGNOSIS — I610 Nontraumatic intracerebral hemorrhage in hemisphere, subcortical: Secondary | ICD-10-CM | POA: Diagnosis not present

## 2015-06-16 DIAGNOSIS — I10 Essential (primary) hypertension: Secondary | ICD-10-CM | POA: Diagnosis not present

## 2015-06-16 DIAGNOSIS — H04123 Dry eye syndrome of bilateral lacrimal glands: Secondary | ICD-10-CM | POA: Diagnosis not present

## 2015-06-16 DIAGNOSIS — M24573 Contracture, unspecified ankle: Secondary | ICD-10-CM

## 2015-06-16 DIAGNOSIS — M24576 Contracture, unspecified foot: Secondary | ICD-10-CM

## 2015-06-16 MED ORDER — LOSARTAN POTASSIUM 100 MG PO TABS: 50.0000 mg | ORAL_TABLET | Freq: Every day | ORAL | Status: DC

## 2015-06-16 MED ORDER — AMLODIPINE BESYLATE 10 MG PO TABS: 10.0000 mg | ORAL_TABLET | Freq: Every day | ORAL | Status: DC

## 2015-06-16 MED ORDER — CLONIDINE HCL 0.1 MG PO TABS: 0.1000 mg | ORAL_TABLET | Freq: Three times a day (TID) | ORAL | Status: DC

## 2015-06-16 MED ORDER — PROPYLENE GLYCOL-GLYCERIN 1-0.3 % OP SOLN: 1.0000 [drp] | Freq: Three times a day (TID) | OPHTHALMIC | Status: DC

## 2015-06-16 MED ORDER — HYDROCODONE-ACETAMINOPHEN 5-325 MG PO TABS: 1.0000 | ORAL_TABLET | Freq: Four times a day (QID) | ORAL | Status: DC | PRN

## 2015-06-16 NOTE — Patient Instructions (Signed)
-   continue ASA and lipitor for stroke prevention.  - check BP at home and goal 120-140. BP better than before, but still on the high side. - recommend PT OT again for braces of both feet and right hand contracture  - Follow up with Dr. Inda Merlin (PCP) for stroke risk factor modification. Recommend maintain blood pressure goal <130/80, diabetes with hemoglobin A1c goal below 6.5% and lipids with LDL cholesterol goal below 70 mg/dL.  - refilled your medication. Call Dr. Inda Merlin for further refills. - aggressive home exercise. - follow up in 6 months.

## 2015-06-16 NOTE — Progress Notes (Signed)
STROKE NEUROLOGY FOLLOW UP NOTE  NAME: [name] DOB: 15-May-1950  REASON FOR VISIT: stroke follow up HISTORY FROM: from chart  Today we had the pleasure of seeing [name] in follow-up at our Neurology Clinic. Pt was accompanied by wife, but the interpreter has left.   History Summary [name] is a 65 y.o. 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 Per daughter he was able to walk with assistance of cain but over the few days prior to admission he had been so weak he could not walk. 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 Per daughter he takes his BP medications only when he feels ill but not when he is feeling good--same with ASA. in the ED he was noted to have BP 240/108, 235/111, 264/140. He was admitted to the medical ICU for further evaluation and treatment. His blood pressure was gradually controlled and he has remained neurologically stable. He also has a remote history of right medial thalamic intracerebral hemorrhage in 2012 from which he apparently made good recovery. He has history of childhood polio with significant deformities in both feet and hands but at baseline is able to ambulate with a cane. He was discharged letter to rehab.  10/25/13 follow up -  the patient has been doing stable. He came from rehab for the visit today with his daughter. His BP 147/90 today a little higher than goal. He has been taking clonidine, amlodipine, lisinopril for BP control. He still has some left UE weakness but LE seems improved much. He still has right sided weakness but more on the UE, not consistent with polio but more due to previous stroke.  01/02/14 follow up - he was discharge from rehab to home 2 weeks ago. Lives with wife, and wife is taking care his meds. Pt does not know what medication he is taking and does not whether he has PCP to follow. He  stated that he does not have much change since last visit. His BP was 167/85 today in clinic. Called his daughter and she does not know much after pt's medication Also called his wife and she can not remember the meds but she said she can check when she gets home tonight. She stated that pt seems not on ASA which was prescribed to him last visit. During the visit, pt had multiple crying spells, but he denies any sadness. His wife also confirmed that he has frequent crying spells at home also.  05/09/14 follow up - patient has been doing the same. Currently, he is wheelchair-bound with right sided weakness. His wife has to work 10 hours per day, so he was left alone at home with diapers, and his friends come over to feed him during meal hours. They are requesting home health.  He has established PCP, however, the PCP information has not been updated in the system. He is taking amlodipine 5 mg, clonidine 0.1 mg twice an day, losartan 50 mg daily for blood pressure control. However, his blood pressure today in clinic 193/94. Wife stated that at home his blood pressure around 160. He is on aspirin and Lipitor for stroke prevention at home now. However last LDL checked was 108. He still has multiple crying spells every day, since better than before after Zoloft. He has PCP with his next week.  11/19/14 follow up -  pt has some improvement. He does not have any crying spells anymore. He had been on Neudextra for about 2-3 months and then stopped as per wife. BP is better than before but still on the high side. Today 165/90. He has been following with Dr. Inda Merlin for BP management. He has appointment with him next month. He is basically at home wheelchair or bedbound. He does not do any exercise at home and finished off home PT/OT/nursing aide. He has damper today but at home use condom catheter. His muscle strength has worsened comparing with before due to lack of use.   Interval History During the interval time, pt  has been doing the same. Interpreter has left for this encounter. Limited information got from wife who is mainly Psychiatrist.  He has out of his BP medications and only on norvasc now. His BP today 173/93. I have refilled his BP medications. His wife also complains that he has foot pain due to contracture, and out of pain medication. I refilled it too but told her that she should contact Dr. Inda Merlin for future refills. He continued to have both foot contracture as well as right hand. Wheelchair bound still. Lack of exercise or therapy.  Crying spells much reduced. Bilateral eye itching.  REVIEW OF SYSTEMS: Full 14 system review of systems performed and notable only for those listed below and in HPI above, all others are negative:  Constitutional:  Cardiovascular:  Ear/Nose/Throat:  Skin: rash Eyes:eye itching Respiratory:  Gastroitestinal:  Genitourinary:  Hematology/Lymphatic:  Endocrine:  Musculoskeletal: Joint pain Allergy/Immunology: Neurological:  Psychiatric:  Sleep:  The following represents the patient's updated allergies and side effects list: No Known Allergies  Labs since last visit of relevance include the following: Results for orders placed or performed in visit on 01/02/14  Lipid panel  Result Value Ref Range   Cholesterol, Total 175 100 - 199 mg/dL   Triglycerides 195 (H) 0 - 149 mg/dL   HDL 28 (L) >39 mg/dL   VLDL Cholesterol Cal 39 5 - 40 mg/dL   LDL Calculated 108 (H) 0 - 99 mg/dL   Chol/HDL Ratio 6.3 (H) 0.0 - 5.0 ratio units    The neurologically relevant items on the patient's problem list were reviewed on today's visit.  Neurologic Examination  A problem focused neurological exam (12 or more points of the single system neurologic examination, vital signs counts as 1 point, cranial nerves count for 8 points) was performed.  Blood pressure 173/93, pulse 65, height 5\' 5"  (1.651 m).  General - thin, well developed, in no apparent  distress, pleasant, limited english.  Ophthalmologic - not able to see through.  Cardiovascular - Regular rate and rhythm with no murmur.  Mental Status -  Level of arousal and orientation to place, and person were intact, but not to time. Language including expression, naming, repetition, comprehension was tried to assess but not successful due to limited [place] but as per wife, he is speaking vietnamese no problem.   Cranial Nerves II - XII - II - Vision intact OU. III, IV, VI - Extraocular movements intact. V - Facial sensation intact bilaterally. VII - Facial movement intact bilaterally. VIII - Hearing & vestibular intact bilaterally. X - Palate elevates symmetrically. XI - Chin turning & shoulder shrug intact bilaterally. XII - Tongue protrusion intact.  Motor Strength - The patient's strength was 0/5 RUE and RLE. Left 4/5 shoulder, 3/5 bicep and tricep and 4+/5 finger grip, LLE 3+/5 proximal and 0/5 distally. Bilateral ankle  inversion, and feet contracture. Right hand contracture. Increased muscle tone at all extremities. Motor Tone - Muscle tone was assessed at the neck and appendages and was normal.  Reflexes - The patient's reflexes were decreased in all extremities and he had no pathological reflexes.  Sensory - Light touch, temperature/pinprick were assessed and were symmetrical.    Coordination - not cooperative due to language limit.  Tremor was absent.  Gait and Station - wheelchair bound, not able to test.  Data reviewed: I personally reviewed the images and agree with the radiology interpretations.  11/18/2010 MRI -  1.  Stable hemorrhagic infarct of the medial right thalamus with mass effect extending into the third ventricle but no intraventricular hemorrhage. 2.  More remote punctate hemorrhage in the posterior right thalamus. 3.  Multiple remote lacunar infarcts involving the basal ganglia bilaterally, corpus callosum, and the cerebellum. 4.  Extensive white  matter disease. MRA -  1.  Extensive small vessel disease. 2.  Moderate stenosis of the distal right M1 segment with asymmetric signal loss in the right greater than left MCA branch vessels. 3.  Potential high-grade stenosis of the proximal left A1 segment. CT head -   1.  Small right medial thalamic hematoma, likely a hypertensive bleed. 2.  Mass effect on the third ventricle but no hydrocephalus. 3.  Multiple remote appearing lacunar type infarcts  08/23/2013 MRI -  1. Similar appearance of right basal ganglia hemorrhage, compatible with acute hemorrhagic infarct related to history of malignant hypertension. 2. Additional multi focal ischemic infarcts involving the bilateral parietal and temporal lobes as well as the right occipital lobe as above. 3. Remote infarcts involving the left basal ganglia/left corona radiata and right middle cerebellar peduncle. 4. Atrophy with advanced chronic microvascular ischemic disease. CT head - A 1.5 x 1.6 cm intraparenchymal hemorrhage within the right basal ganglial region. 2D echo - - Left ventricle: The cavity size was normal. There was moderate concentric hypertrophy. Systolic function was vigorous. The estimated ejection fraction was 75%, in the range of 65% to 70%. Wall motion was normal; there were no regional wall motion abnormalities. Doppler parameters are consistent with abnormal left ventricular relaxation (grade 1 diastolic dysfunction). - Aortic valve: Trileaflet; normal thickness leaflets. No regurgitation. - Mitral valve: No regurgitation. - Right ventricle: Systolic function was normal. - Right atrium: The atrium was normal in size. - Tricuspid valve: No regurgitation. - Pulmonary arteries: Systolic pressure was within the normal range. - Pericardium, extracardiac: There was no pericardial effusion. Carotid doppler The vertebral arteries appear patent with antegrade flow. - Findings consistent with 1-39 percent stenosis  involving the right internal carotid artery and the left internal carotid artery.  Component     Latest Ref Rng 08/23/2013  Cholesterol     0 - 200 mg/dL 157  Triglycerides     <150 mg/dL 168 (H)  HDL     >39 mg/dL 34 (L)  Total CHOL/HDL Ratio      4.6  VLDL     0 - 40 mg/dL 34  LDL (calc)     0 - 99 mg/dL 89    Assessment: As you may recall, he is a [name] with a diagnosis of right BG bleeding. He also had right thalamic bleeding in 2012 but with minimal deficit residue. He also have multiple bilateral lacunar strokes in the MRI. 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 However, he did have multiple cortical punctate strokes  found on current MRI which also raise concern of embolic stroke. His carotid doppler seems not a concern and cardioembolic will need to be considered. But I will hold off any further testing include TCD MES detection, cardionet or loop recorder due to he is not a candidate for anticoagulation therapy. He is on ASA and low dose lipitor. His blood pressure not in good control as BP meds ran out. Has refilled his meds. He has PCP to follow up in May 2017. Pseudobulbar effect much improved. 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   Plan:  - continue ASA and lipitor for stroke prevention.  - check BP at home and goal 120-140. Refilled BP meds - recommend PT OT again for braces of both feet and right hand contracture.  - Follow up with Dr. Inda Merlin (PCP) for stroke risk factor modification. Recommend maintain blood pressure goal <130/80, diabetes with hemoglobin A1c goal below 6.5% and lipids with LDL cholesterol goal below 70 mg/dL.  - refilled pain medication. Call Dr. Inda Merlin for further refills. - aggressive home exercise. - artificial tears for b/l dry eyes. - follow up in 6 months.  I spent more than 25 minutes of face to face time with  the patient. Greater than 50% of time was spent in counseling and coordination of care. We discussed about treating for foot and hand contracture with braces, and important BP control as well as therapy and exercise.   Meds ordered this encounter  Medications  . HYDROcodone-acetaminophen (NORCO/VICODIN) 5-325 MG tablet    Sig: Take 1 tablet by mouth every 6 (six) hours as needed for moderate pain or severe pain.    Dispense:  30 tablet    Refill:  0  . cloNIDine (CATAPRES) 0.1 MG tablet    Sig: Take 1 tablet (0.1 mg total) by mouth 3 (three) times daily.    Dispense:  270 tablet    Refill:  3  . losartan (COZAAR) 100 MG tablet    Sig: Take 0.5 tablets (50 mg total) by mouth daily.    Dispense:  90 tablet    Refill:  3    Pt needs to get future refills from PCP thanks  . amLODipine (NORVASC) 10 MG tablet    Sig: Take 1 tablet (10 mg total) by mouth daily.    Dispense:  90 tablet    Refill:  3  . Propylene Glycol-Glycerin 1-0.3 % SOLN    Sig: Apply 1 drop to eye 3 (three) times daily.    Dispense:  30 mL    Refill:  0   Orders Placed This Encounter  Procedures  . Ambulatory referral to Physical Therapy    Referral Priority:  Routine    Referral Type:  Physical Medicine    Referral Reason:  Specialty Services Required    Requested Specialty:  Physical Therapy    Number of Visits Requested:  1  . Ambulatory referral to Occupational Therapy    Referral Priority:  Routine    Referral Type:  Occupational Therapy    Referral Reason:  Specialty Services Required    Requested Specialty:  Occupational Therapy    Number of Visits Requested:  1   Patient Instructions  - continue ASA and lipitor for stroke prevention.  - check BP at home and goal 120-140. BP better than before, but still on the high side. - recommend PT OT again for braces of  both feet and right hand contracture  - Follow up with Dr. Inda Merlin (PCP) for stroke risk factor modification. Recommend maintain blood pressure  goal <130/80, diabetes with hemoglobin A1c goal below 6.5% and lipids with LDL cholesterol goal below 70 mg/dL.  - refilled your medication. Call Dr. Inda Merlin for further refills. - aggressive home exercise. - follow up in 6 months.   Rosalin Hawking, MD PhD [facility] [address] [place], [place] [address] [phone]

## 2015-06-17 NOTE — Addendum Note (Signed)
Addended by: Rosalin Hawking on: 06/17/2015 05:17 PM   Modules accepted: Orders

## 2015-08-26 DIAGNOSIS — G8250 Quadriplegia, unspecified: Secondary | ICD-10-CM | POA: Diagnosis not present

## 2015-08-26 DIAGNOSIS — L909 Atrophic disorder of skin, unspecified: Secondary | ICD-10-CM | POA: Diagnosis not present

## 2015-08-26 DIAGNOSIS — I616 Nontraumatic intracerebral hemorrhage, multiple localized: Secondary | ICD-10-CM | POA: Diagnosis not present

## 2015-08-26 DIAGNOSIS — F329 Major depressive disorder, single episode, unspecified: Secondary | ICD-10-CM | POA: Diagnosis not present

## 2015-08-26 DIAGNOSIS — E785 Hyperlipidemia, unspecified: Secondary | ICD-10-CM | POA: Diagnosis not present

## 2015-08-26 DIAGNOSIS — K219 Gastro-esophageal reflux disease without esophagitis: Secondary | ICD-10-CM | POA: Diagnosis not present

## 2015-08-26 DIAGNOSIS — R32 Unspecified urinary incontinence: Secondary | ICD-10-CM | POA: Diagnosis not present

## 2015-08-26 DIAGNOSIS — I10 Essential (primary) hypertension: Secondary | ICD-10-CM | POA: Diagnosis not present

## 2015-08-26 DIAGNOSIS — M2550 Pain in unspecified joint: Secondary | ICD-10-CM | POA: Diagnosis not present

## 2015-09-04 DIAGNOSIS — I10 Essential (primary) hypertension: Secondary | ICD-10-CM | POA: Diagnosis not present

## 2015-09-04 DIAGNOSIS — F329 Major depressive disorder, single episode, unspecified: Secondary | ICD-10-CM | POA: Diagnosis not present

## 2015-09-04 DIAGNOSIS — M24575 Contracture, left foot: Secondary | ICD-10-CM | POA: Diagnosis not present

## 2015-09-04 DIAGNOSIS — M24541 Contracture, right hand: Secondary | ICD-10-CM | POA: Diagnosis not present

## 2015-09-04 DIAGNOSIS — M24574 Contracture, right foot: Secondary | ICD-10-CM | POA: Diagnosis not present

## 2015-09-04 DIAGNOSIS — E785 Hyperlipidemia, unspecified: Secondary | ICD-10-CM | POA: Diagnosis not present

## 2015-09-04 DIAGNOSIS — R32 Unspecified urinary incontinence: Secondary | ICD-10-CM | POA: Diagnosis not present

## 2015-09-04 DIAGNOSIS — Z7401 Bed confinement status: Secondary | ICD-10-CM | POA: Diagnosis not present

## 2015-09-04 DIAGNOSIS — I69351 Hemiplegia and hemiparesis following cerebral infarction affecting right dominant side: Secondary | ICD-10-CM | POA: Diagnosis not present

## 2015-12-01 ENCOUNTER — Encounter: Payer: Self-pay | Admitting: Neurology

## 2015-12-19 ENCOUNTER — Ambulatory Visit: Payer: Medicare Other | Admitting: Neurology

## 2016-01-15 ENCOUNTER — Ambulatory Visit: Payer: Medicare Other | Admitting: Neurology

## 2016-01-19 ENCOUNTER — Encounter: Payer: Self-pay | Admitting: Neurology

## 2016-06-09 DIAGNOSIS — J209 Acute bronchitis, unspecified: Secondary | ICD-10-CM | POA: Diagnosis not present

## 2016-06-09 DIAGNOSIS — G8250 Quadriplegia, unspecified: Secondary | ICD-10-CM | POA: Diagnosis not present

## 2016-06-09 DIAGNOSIS — R32 Unspecified urinary incontinence: Secondary | ICD-10-CM | POA: Diagnosis not present

## 2016-06-09 DIAGNOSIS — L909 Atrophic disorder of skin, unspecified: Secondary | ICD-10-CM | POA: Diagnosis not present

## 2016-06-09 DIAGNOSIS — F322 Major depressive disorder, single episode, severe without psychotic features: Secondary | ICD-10-CM | POA: Diagnosis not present

## 2016-09-05 ENCOUNTER — Other Ambulatory Visit: Payer: Self-pay | Admitting: Neurology

## 2016-09-05 DIAGNOSIS — I10 Essential (primary) hypertension: Secondary | ICD-10-CM

## 2016-10-07 ENCOUNTER — Other Ambulatory Visit: Payer: Self-pay | Admitting: Neurology

## 2016-10-07 DIAGNOSIS — I10 Essential (primary) hypertension: Secondary | ICD-10-CM

## 2016-11-01 DIAGNOSIS — L89013 Pressure ulcer of right elbow, stage 3: Secondary | ICD-10-CM | POA: Diagnosis not present

## 2016-11-01 DIAGNOSIS — L89024 Pressure ulcer of left elbow, stage 4: Secondary | ICD-10-CM | POA: Diagnosis not present

## 2016-11-05 DIAGNOSIS — I69398 Other sequelae of cerebral infarction: Secondary | ICD-10-CM | POA: Diagnosis not present

## 2016-11-05 DIAGNOSIS — L89153 Pressure ulcer of sacral region, stage 3: Secondary | ICD-10-CM | POA: Diagnosis not present

## 2016-11-05 DIAGNOSIS — L89023 Pressure ulcer of left elbow, stage 3: Secondary | ICD-10-CM | POA: Diagnosis not present

## 2016-11-05 DIAGNOSIS — L89313 Pressure ulcer of right buttock, stage 3: Secondary | ICD-10-CM | POA: Diagnosis not present

## 2016-11-05 DIAGNOSIS — G8250 Quadriplegia, unspecified: Secondary | ICD-10-CM | POA: Diagnosis not present

## 2016-11-05 DIAGNOSIS — L89013 Pressure ulcer of right elbow, stage 3: Secondary | ICD-10-CM | POA: Diagnosis not present

## 2016-11-10 DIAGNOSIS — I69398 Other sequelae of cerebral infarction: Secondary | ICD-10-CM | POA: Diagnosis not present

## 2016-11-10 DIAGNOSIS — L89153 Pressure ulcer of sacral region, stage 3: Secondary | ICD-10-CM | POA: Diagnosis not present

## 2016-11-10 DIAGNOSIS — G8250 Quadriplegia, unspecified: Secondary | ICD-10-CM | POA: Diagnosis not present

## 2016-11-10 DIAGNOSIS — L89023 Pressure ulcer of left elbow, stage 3: Secondary | ICD-10-CM | POA: Diagnosis not present

## 2016-11-10 DIAGNOSIS — L89013 Pressure ulcer of right elbow, stage 3: Secondary | ICD-10-CM | POA: Diagnosis not present

## 2016-11-10 DIAGNOSIS — L89313 Pressure ulcer of right buttock, stage 3: Secondary | ICD-10-CM | POA: Diagnosis not present

## 2016-11-11 DIAGNOSIS — I69398 Other sequelae of cerebral infarction: Secondary | ICD-10-CM | POA: Diagnosis not present

## 2016-11-11 DIAGNOSIS — L89013 Pressure ulcer of right elbow, stage 3: Secondary | ICD-10-CM | POA: Diagnosis not present

## 2016-11-11 DIAGNOSIS — G8250 Quadriplegia, unspecified: Secondary | ICD-10-CM | POA: Diagnosis not present

## 2016-11-11 DIAGNOSIS — L89153 Pressure ulcer of sacral region, stage 3: Secondary | ICD-10-CM | POA: Diagnosis not present

## 2016-11-11 DIAGNOSIS — L89023 Pressure ulcer of left elbow, stage 3: Secondary | ICD-10-CM | POA: Diagnosis not present

## 2016-11-11 DIAGNOSIS — L89313 Pressure ulcer of right buttock, stage 3: Secondary | ICD-10-CM | POA: Diagnosis not present

## 2016-11-12 DIAGNOSIS — L89313 Pressure ulcer of right buttock, stage 3: Secondary | ICD-10-CM | POA: Diagnosis not present

## 2016-11-12 DIAGNOSIS — I69398 Other sequelae of cerebral infarction: Secondary | ICD-10-CM | POA: Diagnosis not present

## 2016-11-12 DIAGNOSIS — L89153 Pressure ulcer of sacral region, stage 3: Secondary | ICD-10-CM | POA: Diagnosis not present

## 2016-11-12 DIAGNOSIS — L89013 Pressure ulcer of right elbow, stage 3: Secondary | ICD-10-CM | POA: Diagnosis not present

## 2016-11-12 DIAGNOSIS — G8250 Quadriplegia, unspecified: Secondary | ICD-10-CM | POA: Diagnosis not present

## 2016-11-12 DIAGNOSIS — L89023 Pressure ulcer of left elbow, stage 3: Secondary | ICD-10-CM | POA: Diagnosis not present

## 2016-11-16 DIAGNOSIS — L89013 Pressure ulcer of right elbow, stage 3: Secondary | ICD-10-CM | POA: Diagnosis not present

## 2016-11-16 DIAGNOSIS — I69398 Other sequelae of cerebral infarction: Secondary | ICD-10-CM | POA: Diagnosis not present

## 2016-11-16 DIAGNOSIS — L89023 Pressure ulcer of left elbow, stage 3: Secondary | ICD-10-CM | POA: Diagnosis not present

## 2016-11-16 DIAGNOSIS — L89153 Pressure ulcer of sacral region, stage 3: Secondary | ICD-10-CM | POA: Diagnosis not present

## 2016-11-16 DIAGNOSIS — L89313 Pressure ulcer of right buttock, stage 3: Secondary | ICD-10-CM | POA: Diagnosis not present

## 2016-11-16 DIAGNOSIS — G8250 Quadriplegia, unspecified: Secondary | ICD-10-CM | POA: Diagnosis not present

## 2016-11-18 DIAGNOSIS — L89013 Pressure ulcer of right elbow, stage 3: Secondary | ICD-10-CM | POA: Diagnosis not present

## 2016-11-18 DIAGNOSIS — L89153 Pressure ulcer of sacral region, stage 3: Secondary | ICD-10-CM | POA: Diagnosis not present

## 2016-11-18 DIAGNOSIS — I69398 Other sequelae of cerebral infarction: Secondary | ICD-10-CM | POA: Diagnosis not present

## 2016-11-18 DIAGNOSIS — G8250 Quadriplegia, unspecified: Secondary | ICD-10-CM | POA: Diagnosis not present

## 2016-11-18 DIAGNOSIS — L89313 Pressure ulcer of right buttock, stage 3: Secondary | ICD-10-CM | POA: Diagnosis not present

## 2016-11-18 DIAGNOSIS — L89023 Pressure ulcer of left elbow, stage 3: Secondary | ICD-10-CM | POA: Diagnosis not present

## 2016-11-22 DIAGNOSIS — B91 Sequelae of poliomyelitis: Secondary | ICD-10-CM | POA: Diagnosis not present

## 2016-11-22 DIAGNOSIS — I10 Essential (primary) hypertension: Secondary | ICD-10-CM | POA: Diagnosis not present

## 2016-11-22 DIAGNOSIS — K219 Gastro-esophageal reflux disease without esophagitis: Secondary | ICD-10-CM | POA: Diagnosis not present

## 2016-11-22 DIAGNOSIS — E46 Unspecified protein-calorie malnutrition: Secondary | ICD-10-CM | POA: Diagnosis not present

## 2016-11-22 DIAGNOSIS — I69959 Hemiplegia and hemiparesis following unspecified cerebrovascular disease affecting unspecified side: Secondary | ICD-10-CM | POA: Diagnosis not present

## 2016-11-22 DIAGNOSIS — H04123 Dry eye syndrome of bilateral lacrimal glands: Secondary | ICD-10-CM | POA: Diagnosis not present

## 2016-11-22 DIAGNOSIS — F339 Major depressive disorder, recurrent, unspecified: Secondary | ICD-10-CM | POA: Diagnosis not present

## 2016-11-22 DIAGNOSIS — E785 Hyperlipidemia, unspecified: Secondary | ICD-10-CM | POA: Diagnosis not present

## 2016-11-22 DIAGNOSIS — I679 Cerebrovascular disease, unspecified: Secondary | ICD-10-CM | POA: Diagnosis not present

## 2016-11-22 DIAGNOSIS — L89003 Pressure ulcer of unspecified elbow, stage 3: Secondary | ICD-10-CM | POA: Diagnosis not present

## 2016-11-24 DIAGNOSIS — I10 Essential (primary) hypertension: Secondary | ICD-10-CM | POA: Diagnosis not present

## 2016-11-24 DIAGNOSIS — I679 Cerebrovascular disease, unspecified: Secondary | ICD-10-CM | POA: Diagnosis not present

## 2016-11-24 DIAGNOSIS — L89003 Pressure ulcer of unspecified elbow, stage 3: Secondary | ICD-10-CM | POA: Diagnosis not present

## 2016-11-24 DIAGNOSIS — E785 Hyperlipidemia, unspecified: Secondary | ICD-10-CM | POA: Diagnosis not present

## 2016-11-24 DIAGNOSIS — E46 Unspecified protein-calorie malnutrition: Secondary | ICD-10-CM | POA: Diagnosis not present

## 2016-11-24 DIAGNOSIS — I69959 Hemiplegia and hemiparesis following unspecified cerebrovascular disease affecting unspecified side: Secondary | ICD-10-CM | POA: Diagnosis not present

## 2016-11-29 DIAGNOSIS — I10 Essential (primary) hypertension: Secondary | ICD-10-CM | POA: Diagnosis not present

## 2016-11-29 DIAGNOSIS — E785 Hyperlipidemia, unspecified: Secondary | ICD-10-CM | POA: Diagnosis not present

## 2016-11-29 DIAGNOSIS — L89003 Pressure ulcer of unspecified elbow, stage 3: Secondary | ICD-10-CM | POA: Diagnosis not present

## 2016-11-29 DIAGNOSIS — I679 Cerebrovascular disease, unspecified: Secondary | ICD-10-CM | POA: Diagnosis not present

## 2016-11-29 DIAGNOSIS — E46 Unspecified protein-calorie malnutrition: Secondary | ICD-10-CM | POA: Diagnosis not present

## 2016-11-29 DIAGNOSIS — I69959 Hemiplegia and hemiparesis following unspecified cerebrovascular disease affecting unspecified side: Secondary | ICD-10-CM | POA: Diagnosis not present

## 2016-12-03 DIAGNOSIS — I10 Essential (primary) hypertension: Secondary | ICD-10-CM | POA: Diagnosis not present

## 2016-12-03 DIAGNOSIS — L89003 Pressure ulcer of unspecified elbow, stage 3: Secondary | ICD-10-CM | POA: Diagnosis not present

## 2016-12-03 DIAGNOSIS — I679 Cerebrovascular disease, unspecified: Secondary | ICD-10-CM | POA: Diagnosis not present

## 2016-12-03 DIAGNOSIS — E785 Hyperlipidemia, unspecified: Secondary | ICD-10-CM | POA: Diagnosis not present

## 2016-12-03 DIAGNOSIS — I69959 Hemiplegia and hemiparesis following unspecified cerebrovascular disease affecting unspecified side: Secondary | ICD-10-CM | POA: Diagnosis not present

## 2016-12-03 DIAGNOSIS — E46 Unspecified protein-calorie malnutrition: Secondary | ICD-10-CM | POA: Diagnosis not present

## 2016-12-06 DIAGNOSIS — I69959 Hemiplegia and hemiparesis following unspecified cerebrovascular disease affecting unspecified side: Secondary | ICD-10-CM | POA: Diagnosis not present

## 2016-12-06 DIAGNOSIS — I679 Cerebrovascular disease, unspecified: Secondary | ICD-10-CM | POA: Diagnosis not present

## 2016-12-06 DIAGNOSIS — I10 Essential (primary) hypertension: Secondary | ICD-10-CM | POA: Diagnosis not present

## 2016-12-06 DIAGNOSIS — E785 Hyperlipidemia, unspecified: Secondary | ICD-10-CM | POA: Diagnosis not present

## 2016-12-06 DIAGNOSIS — E46 Unspecified protein-calorie malnutrition: Secondary | ICD-10-CM | POA: Diagnosis not present

## 2016-12-06 DIAGNOSIS — L89003 Pressure ulcer of unspecified elbow, stage 3: Secondary | ICD-10-CM | POA: Diagnosis not present

## 2016-12-07 DIAGNOSIS — I69959 Hemiplegia and hemiparesis following unspecified cerebrovascular disease affecting unspecified side: Secondary | ICD-10-CM | POA: Diagnosis not present

## 2016-12-07 DIAGNOSIS — I10 Essential (primary) hypertension: Secondary | ICD-10-CM | POA: Diagnosis not present

## 2016-12-07 DIAGNOSIS — I679 Cerebrovascular disease, unspecified: Secondary | ICD-10-CM | POA: Diagnosis not present

## 2016-12-07 DIAGNOSIS — E46 Unspecified protein-calorie malnutrition: Secondary | ICD-10-CM | POA: Diagnosis not present

## 2016-12-07 DIAGNOSIS — E785 Hyperlipidemia, unspecified: Secondary | ICD-10-CM | POA: Diagnosis not present

## 2016-12-07 DIAGNOSIS — L89003 Pressure ulcer of unspecified elbow, stage 3: Secondary | ICD-10-CM | POA: Diagnosis not present

## 2016-12-09 DIAGNOSIS — E46 Unspecified protein-calorie malnutrition: Secondary | ICD-10-CM | POA: Diagnosis not present

## 2016-12-09 DIAGNOSIS — L89003 Pressure ulcer of unspecified elbow, stage 3: Secondary | ICD-10-CM | POA: Diagnosis not present

## 2016-12-09 DIAGNOSIS — E785 Hyperlipidemia, unspecified: Secondary | ICD-10-CM | POA: Diagnosis not present

## 2016-12-09 DIAGNOSIS — I679 Cerebrovascular disease, unspecified: Secondary | ICD-10-CM | POA: Diagnosis not present

## 2016-12-09 DIAGNOSIS — I69959 Hemiplegia and hemiparesis following unspecified cerebrovascular disease affecting unspecified side: Secondary | ICD-10-CM | POA: Diagnosis not present

## 2016-12-09 DIAGNOSIS — I10 Essential (primary) hypertension: Secondary | ICD-10-CM | POA: Diagnosis not present

## 2016-12-10 DIAGNOSIS — E785 Hyperlipidemia, unspecified: Secondary | ICD-10-CM | POA: Diagnosis not present

## 2016-12-10 DIAGNOSIS — L89003 Pressure ulcer of unspecified elbow, stage 3: Secondary | ICD-10-CM | POA: Diagnosis not present

## 2016-12-10 DIAGNOSIS — I10 Essential (primary) hypertension: Secondary | ICD-10-CM | POA: Diagnosis not present

## 2016-12-10 DIAGNOSIS — I69959 Hemiplegia and hemiparesis following unspecified cerebrovascular disease affecting unspecified side: Secondary | ICD-10-CM | POA: Diagnosis not present

## 2016-12-10 DIAGNOSIS — I679 Cerebrovascular disease, unspecified: Secondary | ICD-10-CM | POA: Diagnosis not present

## 2016-12-10 DIAGNOSIS — E46 Unspecified protein-calorie malnutrition: Secondary | ICD-10-CM | POA: Diagnosis not present

## 2016-12-11 DIAGNOSIS — I10 Essential (primary) hypertension: Secondary | ICD-10-CM | POA: Diagnosis not present

## 2016-12-11 DIAGNOSIS — L89003 Pressure ulcer of unspecified elbow, stage 3: Secondary | ICD-10-CM | POA: Diagnosis not present

## 2016-12-11 DIAGNOSIS — K219 Gastro-esophageal reflux disease without esophagitis: Secondary | ICD-10-CM | POA: Diagnosis not present

## 2016-12-11 DIAGNOSIS — E785 Hyperlipidemia, unspecified: Secondary | ICD-10-CM | POA: Diagnosis not present

## 2016-12-11 DIAGNOSIS — H04123 Dry eye syndrome of bilateral lacrimal glands: Secondary | ICD-10-CM | POA: Diagnosis not present

## 2016-12-11 DIAGNOSIS — F339 Major depressive disorder, recurrent, unspecified: Secondary | ICD-10-CM | POA: Diagnosis not present

## 2016-12-11 DIAGNOSIS — E46 Unspecified protein-calorie malnutrition: Secondary | ICD-10-CM | POA: Diagnosis not present

## 2016-12-11 DIAGNOSIS — I69959 Hemiplegia and hemiparesis following unspecified cerebrovascular disease affecting unspecified side: Secondary | ICD-10-CM | POA: Diagnosis not present

## 2016-12-11 DIAGNOSIS — B91 Sequelae of poliomyelitis: Secondary | ICD-10-CM | POA: Diagnosis not present

## 2016-12-11 DIAGNOSIS — I679 Cerebrovascular disease, unspecified: Secondary | ICD-10-CM | POA: Diagnosis not present

## 2016-12-15 DIAGNOSIS — L89003 Pressure ulcer of unspecified elbow, stage 3: Secondary | ICD-10-CM | POA: Diagnosis not present

## 2016-12-15 DIAGNOSIS — I679 Cerebrovascular disease, unspecified: Secondary | ICD-10-CM | POA: Diagnosis not present

## 2016-12-15 DIAGNOSIS — I10 Essential (primary) hypertension: Secondary | ICD-10-CM | POA: Diagnosis not present

## 2016-12-15 DIAGNOSIS — I69959 Hemiplegia and hemiparesis following unspecified cerebrovascular disease affecting unspecified side: Secondary | ICD-10-CM | POA: Diagnosis not present

## 2016-12-15 DIAGNOSIS — E46 Unspecified protein-calorie malnutrition: Secondary | ICD-10-CM | POA: Diagnosis not present

## 2016-12-15 DIAGNOSIS — E785 Hyperlipidemia, unspecified: Secondary | ICD-10-CM | POA: Diagnosis not present

## 2016-12-16 DIAGNOSIS — E46 Unspecified protein-calorie malnutrition: Secondary | ICD-10-CM | POA: Diagnosis not present

## 2016-12-16 DIAGNOSIS — I679 Cerebrovascular disease, unspecified: Secondary | ICD-10-CM | POA: Diagnosis not present

## 2016-12-16 DIAGNOSIS — E785 Hyperlipidemia, unspecified: Secondary | ICD-10-CM | POA: Diagnosis not present

## 2016-12-16 DIAGNOSIS — I10 Essential (primary) hypertension: Secondary | ICD-10-CM | POA: Diagnosis not present

## 2016-12-16 DIAGNOSIS — I69959 Hemiplegia and hemiparesis following unspecified cerebrovascular disease affecting unspecified side: Secondary | ICD-10-CM | POA: Diagnosis not present

## 2016-12-16 DIAGNOSIS — L89003 Pressure ulcer of unspecified elbow, stage 3: Secondary | ICD-10-CM | POA: Diagnosis not present

## 2016-12-17 DIAGNOSIS — L89003 Pressure ulcer of unspecified elbow, stage 3: Secondary | ICD-10-CM | POA: Diagnosis not present

## 2016-12-17 DIAGNOSIS — I679 Cerebrovascular disease, unspecified: Secondary | ICD-10-CM | POA: Diagnosis not present

## 2016-12-17 DIAGNOSIS — E46 Unspecified protein-calorie malnutrition: Secondary | ICD-10-CM | POA: Diagnosis not present

## 2016-12-17 DIAGNOSIS — E785 Hyperlipidemia, unspecified: Secondary | ICD-10-CM | POA: Diagnosis not present

## 2016-12-17 DIAGNOSIS — I69959 Hemiplegia and hemiparesis following unspecified cerebrovascular disease affecting unspecified side: Secondary | ICD-10-CM | POA: Diagnosis not present

## 2016-12-17 DIAGNOSIS — I10 Essential (primary) hypertension: Secondary | ICD-10-CM | POA: Diagnosis not present

## 2016-12-20 DIAGNOSIS — I69959 Hemiplegia and hemiparesis following unspecified cerebrovascular disease affecting unspecified side: Secondary | ICD-10-CM | POA: Diagnosis not present

## 2016-12-20 DIAGNOSIS — E785 Hyperlipidemia, unspecified: Secondary | ICD-10-CM | POA: Diagnosis not present

## 2016-12-20 DIAGNOSIS — I679 Cerebrovascular disease, unspecified: Secondary | ICD-10-CM | POA: Diagnosis not present

## 2016-12-20 DIAGNOSIS — E46 Unspecified protein-calorie malnutrition: Secondary | ICD-10-CM | POA: Diagnosis not present

## 2016-12-20 DIAGNOSIS — I10 Essential (primary) hypertension: Secondary | ICD-10-CM | POA: Diagnosis not present

## 2016-12-20 DIAGNOSIS — L89003 Pressure ulcer of unspecified elbow, stage 3: Secondary | ICD-10-CM | POA: Diagnosis not present

## 2016-12-22 DIAGNOSIS — I69959 Hemiplegia and hemiparesis following unspecified cerebrovascular disease affecting unspecified side: Secondary | ICD-10-CM | POA: Diagnosis not present

## 2016-12-22 DIAGNOSIS — I679 Cerebrovascular disease, unspecified: Secondary | ICD-10-CM | POA: Diagnosis not present

## 2016-12-22 DIAGNOSIS — I10 Essential (primary) hypertension: Secondary | ICD-10-CM | POA: Diagnosis not present

## 2016-12-22 DIAGNOSIS — E46 Unspecified protein-calorie malnutrition: Secondary | ICD-10-CM | POA: Diagnosis not present

## 2016-12-22 DIAGNOSIS — E785 Hyperlipidemia, unspecified: Secondary | ICD-10-CM | POA: Diagnosis not present

## 2016-12-22 DIAGNOSIS — L89003 Pressure ulcer of unspecified elbow, stage 3: Secondary | ICD-10-CM | POA: Diagnosis not present

## 2016-12-23 DIAGNOSIS — L89003 Pressure ulcer of unspecified elbow, stage 3: Secondary | ICD-10-CM | POA: Diagnosis not present

## 2016-12-23 DIAGNOSIS — I679 Cerebrovascular disease, unspecified: Secondary | ICD-10-CM | POA: Diagnosis not present

## 2016-12-23 DIAGNOSIS — I69959 Hemiplegia and hemiparesis following unspecified cerebrovascular disease affecting unspecified side: Secondary | ICD-10-CM | POA: Diagnosis not present

## 2016-12-23 DIAGNOSIS — I10 Essential (primary) hypertension: Secondary | ICD-10-CM | POA: Diagnosis not present

## 2016-12-23 DIAGNOSIS — E46 Unspecified protein-calorie malnutrition: Secondary | ICD-10-CM | POA: Diagnosis not present

## 2016-12-23 DIAGNOSIS — E785 Hyperlipidemia, unspecified: Secondary | ICD-10-CM | POA: Diagnosis not present

## 2016-12-24 DIAGNOSIS — I679 Cerebrovascular disease, unspecified: Secondary | ICD-10-CM | POA: Diagnosis not present

## 2016-12-24 DIAGNOSIS — L89003 Pressure ulcer of unspecified elbow, stage 3: Secondary | ICD-10-CM | POA: Diagnosis not present

## 2016-12-24 DIAGNOSIS — E46 Unspecified protein-calorie malnutrition: Secondary | ICD-10-CM | POA: Diagnosis not present

## 2016-12-24 DIAGNOSIS — I10 Essential (primary) hypertension: Secondary | ICD-10-CM | POA: Diagnosis not present

## 2016-12-24 DIAGNOSIS — I69959 Hemiplegia and hemiparesis following unspecified cerebrovascular disease affecting unspecified side: Secondary | ICD-10-CM | POA: Diagnosis not present

## 2016-12-24 DIAGNOSIS — E785 Hyperlipidemia, unspecified: Secondary | ICD-10-CM | POA: Diagnosis not present

## 2016-12-27 DIAGNOSIS — I69959 Hemiplegia and hemiparesis following unspecified cerebrovascular disease affecting unspecified side: Secondary | ICD-10-CM | POA: Diagnosis not present

## 2016-12-27 DIAGNOSIS — E785 Hyperlipidemia, unspecified: Secondary | ICD-10-CM | POA: Diagnosis not present

## 2016-12-27 DIAGNOSIS — I10 Essential (primary) hypertension: Secondary | ICD-10-CM | POA: Diagnosis not present

## 2016-12-27 DIAGNOSIS — L89003 Pressure ulcer of unspecified elbow, stage 3: Secondary | ICD-10-CM | POA: Diagnosis not present

## 2016-12-27 DIAGNOSIS — E46 Unspecified protein-calorie malnutrition: Secondary | ICD-10-CM | POA: Diagnosis not present

## 2016-12-27 DIAGNOSIS — I679 Cerebrovascular disease, unspecified: Secondary | ICD-10-CM | POA: Diagnosis not present

## 2016-12-29 DIAGNOSIS — L89003 Pressure ulcer of unspecified elbow, stage 3: Secondary | ICD-10-CM | POA: Diagnosis not present

## 2016-12-29 DIAGNOSIS — I679 Cerebrovascular disease, unspecified: Secondary | ICD-10-CM | POA: Diagnosis not present

## 2016-12-29 DIAGNOSIS — E785 Hyperlipidemia, unspecified: Secondary | ICD-10-CM | POA: Diagnosis not present

## 2016-12-29 DIAGNOSIS — I69959 Hemiplegia and hemiparesis following unspecified cerebrovascular disease affecting unspecified side: Secondary | ICD-10-CM | POA: Diagnosis not present

## 2016-12-29 DIAGNOSIS — I10 Essential (primary) hypertension: Secondary | ICD-10-CM | POA: Diagnosis not present

## 2016-12-29 DIAGNOSIS — E46 Unspecified protein-calorie malnutrition: Secondary | ICD-10-CM | POA: Diagnosis not present

## 2016-12-31 DIAGNOSIS — E46 Unspecified protein-calorie malnutrition: Secondary | ICD-10-CM | POA: Diagnosis not present

## 2016-12-31 DIAGNOSIS — I679 Cerebrovascular disease, unspecified: Secondary | ICD-10-CM | POA: Diagnosis not present

## 2016-12-31 DIAGNOSIS — I69959 Hemiplegia and hemiparesis following unspecified cerebrovascular disease affecting unspecified side: Secondary | ICD-10-CM | POA: Diagnosis not present

## 2016-12-31 DIAGNOSIS — E785 Hyperlipidemia, unspecified: Secondary | ICD-10-CM | POA: Diagnosis not present

## 2016-12-31 DIAGNOSIS — L89003 Pressure ulcer of unspecified elbow, stage 3: Secondary | ICD-10-CM | POA: Diagnosis not present

## 2016-12-31 DIAGNOSIS — I10 Essential (primary) hypertension: Secondary | ICD-10-CM | POA: Diagnosis not present

## 2017-01-03 DIAGNOSIS — I679 Cerebrovascular disease, unspecified: Secondary | ICD-10-CM | POA: Diagnosis not present

## 2017-01-03 DIAGNOSIS — I10 Essential (primary) hypertension: Secondary | ICD-10-CM | POA: Diagnosis not present

## 2017-01-03 DIAGNOSIS — E46 Unspecified protein-calorie malnutrition: Secondary | ICD-10-CM | POA: Diagnosis not present

## 2017-01-03 DIAGNOSIS — E785 Hyperlipidemia, unspecified: Secondary | ICD-10-CM | POA: Diagnosis not present

## 2017-01-03 DIAGNOSIS — I69959 Hemiplegia and hemiparesis following unspecified cerebrovascular disease affecting unspecified side: Secondary | ICD-10-CM | POA: Diagnosis not present

## 2017-01-03 DIAGNOSIS — L89003 Pressure ulcer of unspecified elbow, stage 3: Secondary | ICD-10-CM | POA: Diagnosis not present

## 2017-01-05 DIAGNOSIS — L89003 Pressure ulcer of unspecified elbow, stage 3: Secondary | ICD-10-CM | POA: Diagnosis not present

## 2017-01-05 DIAGNOSIS — E785 Hyperlipidemia, unspecified: Secondary | ICD-10-CM | POA: Diagnosis not present

## 2017-01-05 DIAGNOSIS — I10 Essential (primary) hypertension: Secondary | ICD-10-CM | POA: Diagnosis not present

## 2017-01-05 DIAGNOSIS — E46 Unspecified protein-calorie malnutrition: Secondary | ICD-10-CM | POA: Diagnosis not present

## 2017-01-05 DIAGNOSIS — I679 Cerebrovascular disease, unspecified: Secondary | ICD-10-CM | POA: Diagnosis not present

## 2017-01-05 DIAGNOSIS — I69959 Hemiplegia and hemiparesis following unspecified cerebrovascular disease affecting unspecified side: Secondary | ICD-10-CM | POA: Diagnosis not present

## 2017-01-07 DIAGNOSIS — E46 Unspecified protein-calorie malnutrition: Secondary | ICD-10-CM | POA: Diagnosis not present

## 2017-01-07 DIAGNOSIS — I69959 Hemiplegia and hemiparesis following unspecified cerebrovascular disease affecting unspecified side: Secondary | ICD-10-CM | POA: Diagnosis not present

## 2017-01-07 DIAGNOSIS — L89003 Pressure ulcer of unspecified elbow, stage 3: Secondary | ICD-10-CM | POA: Diagnosis not present

## 2017-01-07 DIAGNOSIS — I10 Essential (primary) hypertension: Secondary | ICD-10-CM | POA: Diagnosis not present

## 2017-01-07 DIAGNOSIS — E785 Hyperlipidemia, unspecified: Secondary | ICD-10-CM | POA: Diagnosis not present

## 2017-01-07 DIAGNOSIS — I679 Cerebrovascular disease, unspecified: Secondary | ICD-10-CM | POA: Diagnosis not present

## 2017-01-10 DIAGNOSIS — I69959 Hemiplegia and hemiparesis following unspecified cerebrovascular disease affecting unspecified side: Secondary | ICD-10-CM | POA: Diagnosis not present

## 2017-01-10 DIAGNOSIS — L89003 Pressure ulcer of unspecified elbow, stage 3: Secondary | ICD-10-CM | POA: Diagnosis not present

## 2017-01-10 DIAGNOSIS — E785 Hyperlipidemia, unspecified: Secondary | ICD-10-CM | POA: Diagnosis not present

## 2017-01-10 DIAGNOSIS — I10 Essential (primary) hypertension: Secondary | ICD-10-CM | POA: Diagnosis not present

## 2017-01-10 DIAGNOSIS — B91 Sequelae of poliomyelitis: Secondary | ICD-10-CM | POA: Diagnosis not present

## 2017-01-10 DIAGNOSIS — I679 Cerebrovascular disease, unspecified: Secondary | ICD-10-CM | POA: Diagnosis not present

## 2017-01-10 DIAGNOSIS — F339 Major depressive disorder, recurrent, unspecified: Secondary | ICD-10-CM | POA: Diagnosis not present

## 2017-01-10 DIAGNOSIS — K219 Gastro-esophageal reflux disease without esophagitis: Secondary | ICD-10-CM | POA: Diagnosis not present

## 2017-01-10 DIAGNOSIS — H04123 Dry eye syndrome of bilateral lacrimal glands: Secondary | ICD-10-CM | POA: Diagnosis not present

## 2017-01-10 DIAGNOSIS — E46 Unspecified protein-calorie malnutrition: Secondary | ICD-10-CM | POA: Diagnosis not present

## 2017-01-12 DIAGNOSIS — I679 Cerebrovascular disease, unspecified: Secondary | ICD-10-CM | POA: Diagnosis not present

## 2017-01-12 DIAGNOSIS — E785 Hyperlipidemia, unspecified: Secondary | ICD-10-CM | POA: Diagnosis not present

## 2017-01-12 DIAGNOSIS — E46 Unspecified protein-calorie malnutrition: Secondary | ICD-10-CM | POA: Diagnosis not present

## 2017-01-12 DIAGNOSIS — I10 Essential (primary) hypertension: Secondary | ICD-10-CM | POA: Diagnosis not present

## 2017-01-12 DIAGNOSIS — I69959 Hemiplegia and hemiparesis following unspecified cerebrovascular disease affecting unspecified side: Secondary | ICD-10-CM | POA: Diagnosis not present

## 2017-01-12 DIAGNOSIS — L89003 Pressure ulcer of unspecified elbow, stage 3: Secondary | ICD-10-CM | POA: Diagnosis not present

## 2017-01-13 DIAGNOSIS — I679 Cerebrovascular disease, unspecified: Secondary | ICD-10-CM | POA: Diagnosis not present

## 2017-01-13 DIAGNOSIS — I10 Essential (primary) hypertension: Secondary | ICD-10-CM | POA: Diagnosis not present

## 2017-01-13 DIAGNOSIS — E785 Hyperlipidemia, unspecified: Secondary | ICD-10-CM | POA: Diagnosis not present

## 2017-01-13 DIAGNOSIS — L89003 Pressure ulcer of unspecified elbow, stage 3: Secondary | ICD-10-CM | POA: Diagnosis not present

## 2017-01-13 DIAGNOSIS — E46 Unspecified protein-calorie malnutrition: Secondary | ICD-10-CM | POA: Diagnosis not present

## 2017-01-13 DIAGNOSIS — I69959 Hemiplegia and hemiparesis following unspecified cerebrovascular disease affecting unspecified side: Secondary | ICD-10-CM | POA: Diagnosis not present

## 2017-01-14 DIAGNOSIS — I10 Essential (primary) hypertension: Secondary | ICD-10-CM | POA: Diagnosis not present

## 2017-01-14 DIAGNOSIS — L89003 Pressure ulcer of unspecified elbow, stage 3: Secondary | ICD-10-CM | POA: Diagnosis not present

## 2017-01-14 DIAGNOSIS — E785 Hyperlipidemia, unspecified: Secondary | ICD-10-CM | POA: Diagnosis not present

## 2017-01-14 DIAGNOSIS — E46 Unspecified protein-calorie malnutrition: Secondary | ICD-10-CM | POA: Diagnosis not present

## 2017-01-14 DIAGNOSIS — I679 Cerebrovascular disease, unspecified: Secondary | ICD-10-CM | POA: Diagnosis not present

## 2017-01-14 DIAGNOSIS — I69959 Hemiplegia and hemiparesis following unspecified cerebrovascular disease affecting unspecified side: Secondary | ICD-10-CM | POA: Diagnosis not present

## 2017-01-15 ENCOUNTER — Emergency Department (HOSPITAL_COMMUNITY)

## 2017-01-15 ENCOUNTER — Encounter (HOSPITAL_COMMUNITY): Payer: Self-pay | Admitting: *Deleted

## 2017-01-15 ENCOUNTER — Inpatient Hospital Stay (HOSPITAL_COMMUNITY)
Admission: EM | Admit: 2017-01-15 | Discharge: 2017-01-20 | DRG: 871 | Disposition: A | Discharge status: Hospice | Attending: Internal Medicine | Admitting: Internal Medicine

## 2017-01-15 DIAGNOSIS — J9601 Acute respiratory failure with hypoxia: Secondary | ICD-10-CM

## 2017-01-15 DIAGNOSIS — Z7982 Long term (current) use of aspirin: Secondary | ICD-10-CM

## 2017-01-15 DIAGNOSIS — Z8673 Personal history of transient ischemic attack (TIA), and cerebral infarction without residual deficits: Secondary | ICD-10-CM

## 2017-01-15 DIAGNOSIS — R0603 Acute respiratory distress: Secondary | ICD-10-CM

## 2017-01-15 DIAGNOSIS — M24575 Contracture, left foot: Secondary | ICD-10-CM | POA: Diagnosis not present

## 2017-01-15 DIAGNOSIS — I610 Nontraumatic intracerebral hemorrhage in hemisphere, subcortical: Secondary | ICD-10-CM

## 2017-01-15 DIAGNOSIS — Z8249 Family history of ischemic heart disease and other diseases of the circulatory system: Secondary | ICD-10-CM | POA: Diagnosis not present

## 2017-01-15 DIAGNOSIS — E785 Hyperlipidemia, unspecified: Secondary | ICD-10-CM | POA: Diagnosis not present

## 2017-01-15 DIAGNOSIS — I69959 Hemiplegia and hemiparesis following unspecified cerebrovascular disease affecting unspecified side: Secondary | ICD-10-CM | POA: Diagnosis not present

## 2017-01-15 DIAGNOSIS — G14 Postpolio syndrome: Secondary | ICD-10-CM | POA: Diagnosis not present

## 2017-01-15 DIAGNOSIS — R9431 Abnormal electrocardiogram [ECG] [EKG]: Secondary | ICD-10-CM | POA: Diagnosis not present

## 2017-01-15 DIAGNOSIS — R0602 Shortness of breath: Secondary | ICD-10-CM

## 2017-01-15 DIAGNOSIS — I679 Cerebrovascular disease, unspecified: Secondary | ICD-10-CM | POA: Diagnosis not present

## 2017-01-15 DIAGNOSIS — M24572 Contracture, left ankle: Secondary | ICD-10-CM | POA: Diagnosis not present

## 2017-01-15 DIAGNOSIS — M24576 Contracture, unspecified foot: Secondary | ICD-10-CM

## 2017-01-15 DIAGNOSIS — E876 Hypokalemia: Secondary | ICD-10-CM

## 2017-01-15 DIAGNOSIS — M24573 Contracture, unspecified ankle: Secondary | ICD-10-CM | POA: Diagnosis not present

## 2017-01-15 DIAGNOSIS — L8990 Pressure ulcer of unspecified site, unspecified stage: Secondary | ICD-10-CM | POA: Diagnosis present

## 2017-01-15 DIAGNOSIS — Z7401 Bed confinement status: Secondary | ICD-10-CM

## 2017-01-15 DIAGNOSIS — J181 Lobar pneumonia, unspecified organism: Secondary | ICD-10-CM

## 2017-01-15 DIAGNOSIS — B91 Sequelae of poliomyelitis: Secondary | ICD-10-CM | POA: Diagnosis not present

## 2017-01-15 DIAGNOSIS — J690 Pneumonitis due to inhalation of food and vomit: Secondary | ICD-10-CM | POA: Diagnosis present

## 2017-01-15 DIAGNOSIS — E46 Unspecified protein-calorie malnutrition: Secondary | ICD-10-CM | POA: Diagnosis not present

## 2017-01-15 DIAGNOSIS — L89003 Pressure ulcer of unspecified elbow, stage 3: Secondary | ICD-10-CM | POA: Diagnosis not present

## 2017-01-15 DIAGNOSIS — Z87891 Personal history of nicotine dependence: Secondary | ICD-10-CM | POA: Diagnosis not present

## 2017-01-15 DIAGNOSIS — Z7189 Other specified counseling: Secondary | ICD-10-CM | POA: Diagnosis not present

## 2017-01-15 DIAGNOSIS — Z681 Body mass index (BMI) 19 or less, adult: Secondary | ICD-10-CM | POA: Diagnosis not present

## 2017-01-15 DIAGNOSIS — J154 Pneumonia due to other streptococci: Secondary | ICD-10-CM | POA: Diagnosis present

## 2017-01-15 DIAGNOSIS — I10 Essential (primary) hypertension: Secondary | ICD-10-CM | POA: Diagnosis not present

## 2017-01-15 DIAGNOSIS — A419 Sepsis, unspecified organism: Principal | ICD-10-CM

## 2017-01-15 DIAGNOSIS — J189 Pneumonia, unspecified organism: Secondary | ICD-10-CM | POA: Diagnosis not present

## 2017-01-15 DIAGNOSIS — Z515 Encounter for palliative care: Secondary | ICD-10-CM

## 2017-01-15 DIAGNOSIS — E872 Acidosis, unspecified: Secondary | ICD-10-CM

## 2017-01-15 DIAGNOSIS — R509 Fever, unspecified: Secondary | ICD-10-CM | POA: Diagnosis not present

## 2017-01-15 LAB — COMPREHENSIVE METABOLIC PANEL
ALBUMIN: 2.8 g/dL — AB (ref 3.5–5.0)
ALT: 14 U/L — ABNORMAL LOW (ref 17–63)
AST: 43 U/L — ABNORMAL HIGH (ref 15–41)
Alkaline Phosphatase: 81 U/L (ref 38–126)
Anion gap: 13 (ref 5–15)
BILIRUBIN TOTAL: 1.2 mg/dL (ref 0.3–1.2)
BUN: 19 mg/dL (ref 6–20)
CHLORIDE: 109 mmol/L (ref 101–111)
CO2: 24 mmol/L (ref 22–32)
CREATININE: 0.62 mg/dL (ref 0.61–1.24)
Calcium: 8.4 mg/dL — ABNORMAL LOW (ref 8.9–10.3)
Glucose, Bld: 166 mg/dL — ABNORMAL HIGH (ref 65–99)
POTASSIUM: 3.3 mmol/L — AB (ref 3.5–5.1)
Sodium: 146 mmol/L — ABNORMAL HIGH (ref 135–145)
TOTAL PROTEIN: 7.4 g/dL (ref 6.5–8.1)

## 2017-01-15 LAB — URINALYSIS, ROUTINE W REFLEX MICROSCOPIC
Bacteria, UA: NONE SEEN
Bilirubin Urine: NEGATIVE
GLUCOSE, UA: NEGATIVE mg/dL
KETONES UR: NEGATIVE mg/dL
Leukocytes, UA: NEGATIVE
Nitrite: NEGATIVE
PH: 7 (ref 5.0–8.0)
Protein, ur: NEGATIVE mg/dL
SPECIFIC GRAVITY, URINE: 1.014 (ref 1.005–1.030)
SQUAMOUS EPITHELIAL / LPF: NONE SEEN

## 2017-01-15 LAB — PROTIME-INR
INR: 1.01
PROTHROMBIN TIME: 13.2 s (ref 11.4–15.2)

## 2017-01-15 LAB — RESPIRATORY PANEL BY PCR
Adenovirus: NOT DETECTED
Bordetella pertussis: NOT DETECTED
CORONAVIRUS HKU1-RVPPCR: NOT DETECTED
CORONAVIRUS NL63-RVPPCR: NOT DETECTED
Chlamydophila pneumoniae: NOT DETECTED
Coronavirus 229E: NOT DETECTED
Coronavirus OC43: NOT DETECTED
INFLUENZA A H1 2009-RVPPR: NOT DETECTED
INFLUENZA A H3-RVPPCR: NOT DETECTED
INFLUENZA B-RVPPCR: NOT DETECTED
Influenza A H1: NOT DETECTED
Influenza A: NOT DETECTED
METAPNEUMOVIRUS-RVPPCR: NOT DETECTED
MYCOPLASMA PNEUMONIAE-RVPPCR: NOT DETECTED
PARAINFLUENZA VIRUS 2-RVPPCR: NOT DETECTED
PARAINFLUENZA VIRUS 3-RVPPCR: NOT DETECTED
Parainfluenza Virus 1: NOT DETECTED
Parainfluenza Virus 4: NOT DETECTED
RHINOVIRUS / ENTEROVIRUS - RVPPCR: NOT DETECTED
Respiratory Syncytial Virus: NOT DETECTED

## 2017-01-15 LAB — BLOOD GAS, ARTERIAL
Acid-base deficit: 3.5 mmol/L — ABNORMAL HIGH (ref 0.0–2.0)
BICARBONATE: 21.2 mmol/L (ref 20.0–28.0)
DRAWN BY: 257701
Delivery systems: POSITIVE
FIO2: 100
O2 Saturation: 98.8 %
PATIENT TEMPERATURE: 98.6
PEEP: 5 cmH2O
PH ART: 7.352 (ref 7.350–7.450)
PRESSURE CONTROL: 12 cmH2O
RATE: 15 resp/min
pCO2 arterial: 39.3 mmHg (ref 32.0–48.0)
pO2, Arterial: 205 mmHg — ABNORMAL HIGH (ref 83.0–108.0)

## 2017-01-15 LAB — LACTIC ACID, PLASMA
LACTIC ACID, VENOUS: 3.9 mmol/L — AB (ref 0.5–1.9)
Lactic Acid, Venous: 3 mmol/L (ref 0.5–1.9)

## 2017-01-15 LAB — MRSA PCR SCREENING: MRSA by PCR: NEGATIVE

## 2017-01-15 LAB — CBC WITH DIFFERENTIAL/PLATELET
BASOS ABS: 0 10*3/uL (ref 0.0–0.1)
BASOS PCT: 0 %
EOS ABS: 0 10*3/uL (ref 0.0–0.7)
Eosinophils Relative: 0 %
HEMATOCRIT: 42.6 % (ref 39.0–52.0)
Hemoglobin: 13.8 g/dL (ref 13.0–17.0)
Lymphocytes Relative: 11 %
Lymphs Abs: 1.5 10*3/uL (ref 0.7–4.0)
MCH: 28.2 pg (ref 26.0–34.0)
MCHC: 32.4 g/dL (ref 30.0–36.0)
MCV: 86.9 fL (ref 78.0–100.0)
MONO ABS: 0.5 10*3/uL (ref 0.1–1.0)
MONOS PCT: 3 %
NEUTROS ABS: 12.2 10*3/uL — AB (ref 1.7–7.7)
Neutrophils Relative %: 86 %
PLATELETS: 359 10*3/uL (ref 150–400)
RBC: 4.9 MIL/uL (ref 4.22–5.81)
RDW: 15.9 % — AB (ref 11.5–15.5)
WBC: 14.2 10*3/uL — ABNORMAL HIGH (ref 4.0–10.5)

## 2017-01-15 LAB — I-STAT CG4 LACTIC ACID, ED
LACTIC ACID, VENOUS: 3.69 mmol/L — AB (ref 0.5–1.9)
Lactic Acid, Venous: 4.07 mmol/L (ref 0.5–1.9)

## 2017-01-15 LAB — BRAIN NATRIURETIC PEPTIDE: B Natriuretic Peptide: 44.9 pg/mL (ref 0.0–100.0)

## 2017-01-15 LAB — TSH: TSH: 1.167 u[IU]/mL (ref 0.350–4.500)

## 2017-01-15 LAB — I-STAT TROPONIN, ED: TROPONIN I, POC: 0.01 ng/mL (ref 0.00–0.08)

## 2017-01-15 LAB — PROCALCITONIN: Procalcitonin: 0.12 ng/mL

## 2017-01-15 MED ORDER — SODIUM CHLORIDE 0.9 % IV BOLUS (SEPSIS)
500.0000 mL | Freq: Once | INTRAVENOUS | Status: AC
  Administered 2017-01-15: 500 mL via INTRAVENOUS

## 2017-01-15 MED ORDER — SERTRALINE HCL 100 MG PO TABS
100.0000 mg | ORAL_TABLET | Freq: Every day | ORAL | Status: DC
  Administered 2017-01-16 – 2017-01-20 (×5): 100 mg via ORAL
  Filled 2017-01-15 (×5): qty 1

## 2017-01-15 MED ORDER — DEXTROSE 5 % IV SOLN: 1.0000 g | INTRAVENOUS | Status: DC

## 2017-01-15 MED ORDER — METHYLPREDNISOLONE SODIUM SUCC 40 MG IJ SOLR: 40.0000 mg | Freq: Two times a day (BID) | INTRAMUSCULAR | Status: DC

## 2017-01-15 MED ORDER — ASPIRIN EC 81 MG PO TBEC
81.0000 mg | DELAYED_RELEASE_TABLET | Freq: Every day | ORAL | Status: DC
  Administered 2017-01-16 – 2017-01-17 (×2): 81 mg via ORAL
  Filled 2017-01-15 (×2): qty 1

## 2017-01-15 MED ORDER — SODIUM CHLORIDE 0.9 % IV BOLUS (SEPSIS)
250.0000 mL | Freq: Once | INTRAVENOUS | Status: AC
  Administered 2017-01-15: 250 mL via INTRAVENOUS

## 2017-01-15 MED ORDER — IOPAMIDOL (ISOVUE-370) INJECTION 76%
INTRAVENOUS | Status: AC
  Administered 2017-01-15: 70 mL via INTRAVENOUS
  Filled 2017-01-15: qty 100

## 2017-01-15 MED ORDER — ENOXAPARIN SODIUM 30 MG/0.3ML ~~LOC~~ SOLN
30.0000 mg | SUBCUTANEOUS | Status: DC
  Administered 2017-01-15 – 2017-01-19 (×5): 30 mg via SUBCUTANEOUS
  Filled 2017-01-15 (×6): qty 0.3

## 2017-01-15 MED ORDER — POTASSIUM CHLORIDE 10 MEQ/100ML IV SOLN
INTRAVENOUS | Status: AC
  Filled 2017-01-15: qty 100

## 2017-01-15 MED ORDER — ATORVASTATIN CALCIUM 10 MG PO TABS
10.0000 mg | ORAL_TABLET | Freq: Every day | ORAL | Status: DC
  Administered 2017-01-17 – 2017-01-19 (×3): 10 mg via ORAL
  Filled 2017-01-15 (×3): qty 1

## 2017-01-15 MED ORDER — ORAL CARE MOUTH RINSE
15.0000 mL | Freq: Two times a day (BID) | OROMUCOSAL | Status: DC
  Administered 2017-01-16 – 2017-01-20 (×9): 15 mL via OROMUCOSAL

## 2017-01-15 MED ORDER — IPRATROPIUM-ALBUTEROL 0.5-2.5 (3) MG/3ML IN SOLN
3.0000 mL | Freq: Four times a day (QID) | RESPIRATORY_TRACT | Status: DC
  Administered 2017-01-15 – 2017-01-19 (×15): 3 mL via RESPIRATORY_TRACT
  Filled 2017-01-15 (×16): qty 3

## 2017-01-15 MED ORDER — DEXTROSE 5 % IV SOLN
1.0000 g | INTRAVENOUS | Status: DC
  Administered 2017-01-16 – 2017-01-19 (×4): 1 g via INTRAVENOUS
  Filled 2017-01-15 (×5): qty 10

## 2017-01-15 MED ORDER — GUAIFENESIN ER 600 MG PO TB12: 600.0000 mg | ORAL_TABLET | Freq: Two times a day (BID) | ORAL | Status: DC

## 2017-01-15 MED ORDER — DEXTROSE 5 % IV SOLN: 500.0000 mg | Freq: Every day | INTRAVENOUS | Status: DC

## 2017-01-15 MED ORDER — DEXTROSE 5 % IV SOLN
500.0000 mg | Freq: Once | INTRAVENOUS | Status: AC
  Administered 2017-01-15: 500 mg via INTRAVENOUS
  Filled 2017-01-15: qty 500

## 2017-01-15 MED ORDER — MIRTAZAPINE 15 MG PO TABS
30.0000 mg | ORAL_TABLET | Freq: Every day | ORAL | Status: DC
  Administered 2017-01-17 – 2017-01-19 (×3): 30 mg via ORAL
  Filled 2017-01-15 (×3): qty 2

## 2017-01-15 MED ORDER — DEXTROSE 5 % IV SOLN: 500.0000 mg | INTRAVENOUS | Status: DC

## 2017-01-15 MED ORDER — CLONIDINE HCL 0.1 MG PO TABS
0.1000 mg | ORAL_TABLET | Freq: Three times a day (TID) | ORAL | Status: DC
  Administered 2017-01-16 – 2017-01-20 (×10): 0.1 mg via ORAL
  Filled 2017-01-15 (×11): qty 1

## 2017-01-15 MED ORDER — METHYLPREDNISOLONE SODIUM SUCC 40 MG IJ SOLR
40.0000 mg | Freq: Two times a day (BID) | INTRAMUSCULAR | Status: DC
  Administered 2017-01-15 – 2017-01-19 (×8): 40 mg via INTRAVENOUS
  Filled 2017-01-15 (×8): qty 1

## 2017-01-15 MED ORDER — ENOXAPARIN SODIUM 40 MG/0.4ML ~~LOC~~ SOLN: 40.0000 mg | SUBCUTANEOUS | Status: DC

## 2017-01-15 MED ORDER — SODIUM CHLORIDE 0.9 % IV SOLN
INTRAVENOUS | Status: DC
  Administered 2017-01-15: 75 mL via INTRAVENOUS

## 2017-01-15 MED ORDER — GUAIFENESIN ER 600 MG PO TB12
1200.0000 mg | ORAL_TABLET | Freq: Two times a day (BID) | ORAL | Status: DC
  Administered 2017-01-16: 1200 mg via ORAL
  Filled 2017-01-15 (×3): qty 2

## 2017-01-15 MED ORDER — SODIUM CHLORIDE 0.9 % IV BOLUS (SEPSIS)
1000.0000 mL | Freq: Once | INTRAVENOUS | Status: AC
  Administered 2017-01-15: 1000 mL via INTRAVENOUS

## 2017-01-15 MED ORDER — SODIUM CHLORIDE 0.9 % IV BOLUS (SEPSIS)
250.0000 mL | Freq: Once | INTRAVENOUS | Status: AC
  Administered 2017-01-16: 250 mL via INTRAVENOUS

## 2017-01-15 MED ORDER — MIRTAZAPINE 15 MG PO TABS: 30.0000 mg | ORAL_TABLET | Freq: Every day | ORAL | Status: DC

## 2017-01-15 MED ORDER — DEXTROSE 5 % IV SOLN
1.0000 g | Freq: Once | INTRAVENOUS | Status: AC
  Administered 2017-01-15: 1 g via INTRAVENOUS
  Filled 2017-01-15: qty 10

## 2017-01-15 MED ORDER — GUAIFENESIN 100 MG/5ML PO SOLN: 10.0000 mL | ORAL | Status: DC | PRN

## 2017-01-15 MED ORDER — POTASSIUM CHLORIDE 10 MEQ/100ML IV SOLN
10.0000 meq | INTRAVENOUS | Status: AC
  Administered 2017-01-15 (×3): 10 meq via INTRAVENOUS
  Filled 2017-01-15: qty 100

## 2017-01-15 MED ORDER — DOXYCYCLINE HYCLATE 100 MG IV SOLR
100.0000 mg | Freq: Two times a day (BID) | INTRAVENOUS | Status: DC
  Administered 2017-01-15 – 2017-01-16 (×2): 100 mg via INTRAVENOUS
  Filled 2017-01-15 (×2): qty 100

## 2017-01-15 MED ORDER — CHLORHEXIDINE GLUCONATE 0.12 % MT SOLN
15.0000 mL | Freq: Two times a day (BID) | OROMUCOSAL | Status: DC
  Administered 2017-01-15 – 2017-01-20 (×10): 15 mL via OROMUCOSAL
  Filled 2017-01-15 (×6): qty 15

## 2017-01-15 NOTE — ED Provider Notes (Signed)
[place] DEPT Provider Note   CSN: [id number] Arrival date & time: 01/15/17  1244     History   Chief Complaint Chief Complaint  Patient presents with  . Shortness of Breath    HPI [name] is a 66 y.o. male.  HPI level 5 caveat DD patient condition  Patient speaks [place]  66 year old male presents today via EMS with reports of shortness of breath.  Patient is coming from home where he is receiving hospice care.  Family at bedside reports that over the last week he has had productive cough with worsening shortness of breath today.  He notes he was evaluated by home health nurse yesterday with no elevation in temperature or adventitious lung sounds.  They note patient is bedbound status post stroke.   Family notes patient is full code requesting intubation, chest compressions, vasopressors, or any other significant life-saving intervention   Consult with hospice they report they are managing his chronic medical conditions.  Hospice nurse evaluated patient yesterday with bilateral rhonchi and rails, he was started on cough medication and doxycycline 100 mg twice daily.     Past Medical History:  Diagnosis Date  . Hypertension   . Polio   . Stroke [facility])     Patient Active Problem List   Diagnosis Date Noted  . CAP (community acquired pneumonia) 01/15/2017  . Contracture of ankle and foot joint 06/16/2015  . Dry eyes 06/16/2015  . Nontraumatic subcortical hemorrhage of right cerebral hemisphere ([place]) 06/16/2015  . Essential hypertension 11/19/2014  . PBA (pseudobulbar affect) 05/09/2014  . Nontraumatic subcortical hemorrhage of cerebral hemisphere ([place]) 05/09/2014  . Hypertensive emergency 05/09/2014  . Quadriplegia and quadriparesis ([place]) 12/31/2013  . Tendinitis of left ankle 09/14/2013  . Arthritis of ankle, left, degenerative 09/14/2013  . Gout attack 09/14/2013  . Dehydration 09/14/2013  . Depression with anxiety 09/14/2013  . HTN (hypertension)  08/29/2013  . 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 Acute embolic stroke ([place]) 82/50/5397  . ICH (intracerebral hemorrhage) ([place]) 08/22/2013  . Stroke [facility]) 08/22/2013    Past Surgical History:  Procedure Laterality Date  . NO PAST SURGERIES         Home Medications    Prior to Admission medications   Medication Sig Start Date End Date Taking? Authorizing Provider  amLODipine (NORVASC) 10 MG tablet Take 1 tablet (10 mg total) by mouth daily. 06/16/15  Yes Rosalin Hawking, MD  aspirin EC 81 MG tablet Take 1 tablet (81 mg total) by mouth daily. 01/02/14  Yes Rosalin Hawking, MD  atorvastatin (LIPITOR) 10 MG tablet Take 10 mg by mouth daily. 11/15/16  Yes [provider]  cloNIDine (CATAPRES) 0.1 MG tablet Take 1 tablet (0.1 mg total) by mouth 3 (three) times daily. 06/16/15  Yes Rosalin Hawking, MD  HYDROcodone-acetaminophen (NORCO/VICODIN) 5-325 MG tablet Take 1 tablet by mouth every 6 (six) hours as needed for moderate pain or severe pain. 06/16/15  Yes Rosalin Hawking, MD  losartan (COZAAR) 100 MG tablet Take 0.5 tablets (50 mg total) by mouth daily. 06/16/15  Yes Rosalin Hawking, MD  mirtazapine (REMERON) 30 MG tablet Take 30 mg by mouth at bedtime.   Yes [provider]  MUCINEX 600 MG 12 hr tablet Take 600 mg by mouth every 12 (twelve) hours. 03/26/14  Yes [provider]  naproxen (NAPROSYN) 500 MG tablet Take 500 mg by mouth 2 (two) times daily with a meal.   Yes [provider]  NEXIUM 40 MG capsule Take 40 mg  by mouth daily.  10/24/14  Yes [provider]  sertraline (ZOLOFT) 100 MG tablet Take 100 mg by mouth daily.   Yes [provider]  TUSSIN 100 MG/5ML syrup Take 10 mLs by mouth every 4 (four) hours as needed for cough. 12/16/16  Yes [provider]    Family History Family History  Problem Relation Age of Onset  . Hypertension Mother   . Hypertension Father     Social History Social History  Substance Use Topics  .  Smoking status: Former Smoker    Types: Cigarettes  . Smokeless tobacco: Never Used  . Alcohol use No     Allergies   Patient has no known allergies.   Review of Systems Review of Systems  Unable to perform ROS: Acuity of condition     Physical Exam Updated Vital Signs BP (!) 150/95   Pulse 94   Temp 98.5 F (36.9 C) (Rectal)   Resp 20   Wt 52.2 kg (115 lb)   SpO2 100%   BMI 19.14 kg/m   Physical Exam  Constitutional: He is oriented to person, place, and time.  Malnourished  HENT:  Head: Normocephalic and atraumatic.  Eyes: Pupils are equal, round, and reactive to light. Conjunctivae are normal. Right eye exhibits no discharge. Left eye exhibits no discharge. No scleral icterus.  Neck: Normal range of motion. No JVD present. No tracheal deviation present.  Pulmonary/Chest: No stridor.  Respiratory distress accessory muscle use, Rales noted right lower lobe-no obvious wheeze  Musculoskeletal: He exhibits no edema.  Neurological: He is alert and oriented to person, place, and time. Coordination normal.  Psychiatric: He has a normal mood and affect. His behavior is normal. Judgment and thought content normal.  Nursing note and vitals reviewed.    ED Treatments / Results  Labs (all labs ordered are listed, but only abnormal results are displayed) Labs Reviewed  COMPREHENSIVE METABOLIC PANEL - Abnormal; Notable for the following:       Result Value   Sodium 146 (*)    Potassium 3.3 (*)    Glucose, Bld 166 (*)    Calcium 8.4 (*)    Albumin 2.8 (*)    AST 43 (*)    ALT 14 (*)    All other components within normal limits  CBC WITH DIFFERENTIAL/PLATELET - Abnormal; Notable for the following:    WBC 14.2 (*)    RDW 15.9 (*)    Neutro Abs 12.2 (*)    All other components within normal limits  URINALYSIS, ROUTINE W REFLEX MICROSCOPIC - Abnormal; Notable for the following:    Hgb urine dipstick SMALL (*)    All other components within normal limits  BLOOD GAS,  ARTERIAL - Abnormal; Notable for the following:    pO2, Arterial 205 (*)    Acid-base deficit 3.5 (*)    All other components within normal limits  I-STAT CG4 LACTIC ACID, ED - Abnormal; Notable for the following:    Lactic Acid, Venous 3.69 (*)    All other components within normal limits  I-STAT CG4 LACTIC ACID, ED - Abnormal; Notable for the following:    Lactic Acid, Venous 4.07 (*)    All other components within normal limits  CULTURE, BLOOD (ROUTINE X 2)  CULTURE, BLOOD (ROUTINE X 2)  RESPIRATORY PANEL BY PCR  PROTIME-INR  BRAIN NATRIURETIC PEPTIDE  PROCALCITONIN  PROCALCITONIN  I-STAT TROPONIN, ED  I-STAT CG4 LACTIC ACID, ED    EKG  EKG Interpretation  Date/Time:  Saturday January 15 2017 14:18:44 EDT Ventricular Rate:  96 PR Interval:    QRS Duration: 106 QT Interval:  402 QTC Calculation: 508 R Axis:   88 Text Interpretation:  Sinus rhythm Probable left atrial enlargement Borderline right axis deviation Borderline repolarization abnormality Prolonged QT interval No significant change since last tracing Since last tracing of earlier today Confirmed by Dorie Rank [phone]) on 01/15/2017 2:46:25 PM       Radiology Ct Angio Chest Pe W And/or Wo Contrast  Result Date: 01/15/2017 CLINICAL DATA:  Malaise times several days with dyspnea productive cough. Rhonchi. EXAM: CT ANGIOGRAPHY CHEST WITH CONTRAST TECHNIQUE: Multidetector CT imaging of the chest was performed using the standard protocol during bolus administration of intravenous contrast. Multiplanar CT image reconstructions and MIPs were obtained to evaluate the vascular anatomy. CONTRAST:  70 cc Isovue 370 IV COMPARISON:  CXR 01/15/2017 and 12/17/2013 FINDINGS: Cardiovascular: Heart size is normal. There is no pericardial effusion. No acute pulmonary embolus to the segmental level. The aorta is normal in caliber with minimal aortic atherosclerosis. No dissection. Mediastinum/Nodes: Small precarinal and right hilar  subcentimeter short axis lymph nodes are noted. No axillary or supraclavicular lymphadenopathy. The trachea and mainstem bronchi are patent however there are finger-like soft tissue densities within the lobar, segmental and subsegmental branches to the left lower lobe. Lungs/Pleura: Left lower lobe atelectasis due to soft tissue densities within lobar, segmental and subsegmental branches to the left lower lobe. Peribronchial thickening is noted to both lower lobes and lingula. Nonspecific scattered nodular infiltrates are identified to the left upper lobe and lingula. Upper Abdomen: The liver is hypodense in appearance consistent with hepatic steatosis. No acute upper abdominal abnormality. Musculoskeletal: The bones are demineralized in appearance. No acute nor suspicious osseous abnormalities. Review of the MIP images confirms the above findings. IMPRESSION: 1. Left lower lobe atelectasis due to endobronchial soft tissue densities noted of left lower lobe bronchi with differential possibilities more commonly would include mucous plugging though endobronchial lesions are not entirely excluded. 2. Scattered nodular infiltrates in the left upper lobe and lingula, nonspecific possibly related to community-acquired pneumonia such as mycoplasma, fungal infection, or granulomatous disease potentially. Followup PA and lateral chest X-ray is recommended in 3-4 weeks following trial of antibiotic therapy to ensure resolution and exclude underlying malignancy. 3. No acute pulmonary embolus. Aortic Atherosclerosis (ICD10-I70.0). Electronically Signed   By: Ashley Royalty M.D.   On: 01/15/2017 16:02   Dg Chest Portable 1 View  Result Date: 01/15/2017 CLINICAL DATA:  Feeling unwell for the past few days with SOB. Has also had productive cough. EMS noted rhonchi throughout. SPO2 in 70s with EMS. Increased to 80s on NRB. EXAM: PORTABLE CHEST 1 VIEW COMPARISON:  12/17/2013 FINDINGS: There is mild left basilar atelectasis. There  is no focal consolidation. There is no pleural effusion or pneumothorax. The heart and mediastinal contours are unremarkable. The osseous structures are unremarkable. IMPRESSION: No active cardiopulmonary disease. If there is further clinical concern, recommend a two-view chest. Electronically Signed   By: Kathreen Devoid   On: 01/15/2017 13:45    Procedures Procedures (including critical care time)    CRITICAL CARE Performed by: Elmer Ramp   Total critical care time: 35 minutes  Critical care time was exclusive of separately billable procedures and treating other patients.  Critical care was necessary to treat or prevent imminent or life-threatening deterioration.  Critical care was time spent personally by me on the following activities: development of treatment plan with patient and/or surrogate  as well as nursing, discussions with consultants, evaluation of patient's response to treatment, examination of patient, obtaining history from patient or surrogate, ordering and performing treatments and interventions, ordering and review of laboratory studies, ordering and review of radiographic studies, pulse oximetry and re-evaluation of patient's condition.  Medications Ordered in ED Medications  cefTRIAXone (ROCEPHIN) 1 g in dextrose 5 % 50 mL IVPB (not administered)  azithromycin (ZITHROMAX) 500 mg in dextrose 5 % 250 mL IVPB (not administered)  guaiFENesin (MUCINEX) 12 hr tablet 1,200 mg (not administered)  sodium chloride 0.9 % bolus 1,000 mL (not administered)  sodium chloride 0.9 % bolus 1,000 mL (0 mLs Intravenous Stopped 01/15/17 1448)    And  sodium chloride 0.9 % bolus 500 mL (0 mLs Intravenous Stopped 01/15/17 1505)    And  sodium chloride 0.9 % bolus 250 mL (0 mLs Intravenous Stopped 01/15/17 1525)  cefTRIAXone (ROCEPHIN) 1 g in dextrose 5 % 50 mL IVPB (0 g Intravenous Stopped 01/15/17 1448)  azithromycin (ZITHROMAX) 500 mg in dextrose 5 % 250 mL IVPB (0 mg Intravenous  Stopped 01/15/17 1505)  iopamidol (ISOVUE-370) 76 % injection (70 mLs Intravenous Contrast Given 01/15/17 1532)     Initial Impression / Assessment and Plan / ED Course  I have reviewed the triage vital signs and the nursing notes.  Pertinent labs & imaging results that were available during my care of the patient were reviewed by me and considered in my medical decision making (see chart for details).    Patient in respiratory distress, oxygen dropping down into the upper 60s and 70s, patient is working to breathe.  Respiratory therapy called for BiPAP.  5:17 PM patient reassessed, improvement with BiPAP in the mid to upper 90 O2 saturation  3:00- Patient reassessed continues to tolerate BiPAP in no acute distress  5:17 PM patient continues to appear to be in no acute distress tolerating BiPAP  Clinical Course as of Jan 16 1716  Sat Jan 15, 2017  1324 Hospice noted.  Pt states he is full code and would want to be intubated if necessary.  Pt refused IV by EMS.  Will start Bipap.  Start labs, IV xray.  Sats are low at bedside.  May need intubation.  [JK]  1622 Pt responded to bipap. Oxygenating well.    [JK]    Clinical Course User Index [JK] Dorie Rank, MD   Final Clinical Impressions(s) / ED Diagnoses   Final diagnoses:  Sepsis, due to unspecified organism [facility])  Community acquired pneumonia of left lung, unspecified part of lung  Respiratory distress    Labs: Blood gas, blood culture, i-STAT troponin, i-STAT lactic acid, CMP CBC PT/INR,, BNP, urinalysis  Imaging: CT angios chest PE, DG chest portable  Consults: [facility]  Therapeutics: Ceftriaxone, azithromycin, normal saline  Discharge Meds:   Assessment/Plan: 65 year old male presents today with respiratory distress.  This is likely secondary to community-acquired pneumonia.  Initially hypoxic, started on BiPAP which patient tolerated very well.  Patient did have preceding symptoms consistent with community-acquired  pneumonia.  Patient started on sepsis protocol.  No acute findings on plain films, CT angios to rule out PE performed no signs of PE, consistent with pneumonia.  Patient will require hospital admission for ongoing management.   New Prescriptions New Prescriptions   No medications on file     Francee Gentile 01/15/17 1717    Dorie Rank, MD 01/16/17 [phone]

## 2017-01-15 NOTE — ED Triage Notes (Signed)
Per EMS. Pt has been feeling unwell for the past few days with SOB. Has also had productive cough. EMS noted rhonchi throughout. SPO2 in 70s with EMS. Increased to 80s on NRB. Pt is with hospice, however wife told EMS he was full code. Pt refused IV.

## 2017-01-15 NOTE — ED Notes (Signed)
He is comfortably resting and remains on bi-pap.

## 2017-01-15 NOTE — Progress Notes (Signed)
[facility]  ED chart reviewed and attempted to see in ED. [facility] was not notified prior to contacting EMS and patient coming to hospital. [facility] RN will follow up tomorrow.   Thank you,  Erling Conte, LCSW [phone]

## 2017-01-15 NOTE — Progress Notes (Signed)
Pharmacy Antibiotic Note  [name] is a 66 y.o. male admitted on 01/15/2017 with CAP.  Pharmacy has been consulted for Rocephin and Azithromycin dosing.  Plan: Rocephin 1 gm IV q24 Azithromycin 500 mg IV q24 Pharmacy to sign off  Weight: 115 lb (52.2 kg)  No data recorded.   Recent Labs Lab 01/15/17 1319 01/15/17 1325  WBC 14.2*  --   CREATININE 0.62  --   LATICACIDVEN  --  3.69*    CrCl cannot be calculated (Unknown ideal weight.).    No Known Allergies  Thank you for allowing pharmacy to be a part of this patient's care.  Eudelia Bunch, Pharm.D. [phone] 01/15/2017 1:58 PM

## 2017-01-15 NOTE — ED Provider Notes (Signed)
Pt presents to the ED with shortness of breath and cough.  Pt has history of prior stroke and post polio syndrome.  Last electronic note was in march of last year with neurology.    Physical Exam  BP (!) 144/97   Pulse (!) 101   Resp (!) 25   Wt 52.2 kg (115 lb)   SpO2 100%   BMI 19.14 kg/m   Physical Exam  Constitutional: He appears distressed.  HENT:  Head: Normocephalic and atraumatic.  Right Ear: External ear normal.  Left Ear: External ear normal.  Eyes: Conjunctivae are normal. Right eye exhibits no discharge. Left eye exhibits no discharge. No scleral icterus.  Neck: Neck supple. No tracheal deviation present.  Cardiovascular: Normal rate.   Pulmonary/Chest: Accessory muscle usage present. No stridor. Tachypnea noted. No respiratory distress. He has no wheezes.  Abdominal: He exhibits no distension.  Musculoskeletal: He exhibits no edema.  Neurological: He is alert. He displays atrophy. Cranial nerve deficit: no gross deficits. He exhibits abnormal muscle tone.  Awake, answering questions, contractures noted  Skin: Skin is warm and dry. No rash noted.  Psychiatric: He has a normal mood and affect.  Nursing note and vitals reviewed.   ED Course  .Critical Care Performed by: Dorie Rank Authorized by: Dorie Rank   Critical care provider statement:    Critical care time (minutes):  35   Critical care was time spent personally by me on the following activities:  Discussions with consultants, evaluation of patient's response to treatment, examination of patient, ordering and performing treatments and interventions, ordering and review of laboratory studies, ordering and review of radiographic studies, pulse oximetry, re-evaluation of patient's condition, obtaining history from patient or surrogate and review of old charts   Clinical Course as of Jan 15 1622  Sat Jan 15, 2017  1324 Hospice noted.  Pt states he is full code and would want to be intubated if necessary.  Pt  refused IV by EMS.  Will start Bipap.  Start labs, IV xray.  Sats are low at bedside.  May need intubation.  [JK]  1622 Pt responded to bipap. Oxygenating well.    [JK]    Clinical Course User Index [JK] Dorie Rank, MD    MDM Pt presented with shortness of breath.  Initial o2 sats very low.  Responded well to bipap.  CT angio without pe, probable pna.  Lactic acid elevated but no signs of severe sepsis.  Started on abx.   Admitted to the hospital for further treatment.       Dorie Rank, MD 01/15/17 [phone]

## 2017-01-15 NOTE — ED Notes (Signed)
Bed: HO64 Expected date:  Expected time:  Means of arrival:  Comments: 66 yo PNA- hospice pt

## 2017-01-15 NOTE — Progress Notes (Signed)
CRITICAL VALUE ALERT  Critical Value:  Lactic 3.0 Date & Time Notied: 01/15/2017 1845  Provider Notified: Dr. Alfredia Ferguson  Orders Received/Actions taken: no new orders currently

## 2017-01-15 NOTE — ED Notes (Signed)
Respiratory called

## 2017-01-15 NOTE — H&P (Signed)
History and Physical    [name] XBM:[id number] DOB: 10-31-1950 DOA: 01/15/2017  PCP: System, Pcp Not In   Patient coming from: Home  Chief Complaint: SOB and worsening Cough  HPI: [name] is a very pleasant [place] 66 y.o. 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 Patient understands some English but history was provided by patient with Daughter translating, and per the patient had worsening Cough (light green colored sputum and mucous) for over a week. Of note the patient is a Hospice patient for unclear reasons but per documentation are managing his chronic medical conditions and remains Full Code. Fort the past week or so patient has had SOB with a productive cough and when EMS was called they found the patient to have an SpO2 in the 60's-70's. Patient was brought in and placed on a NRB with improvement in saturations however then ended up on BiPAP. Per family patient has not been febrile and did not complain of any other concerns. Hospice Nurse evaluated yesterday and patient was found to have bilateral rhonchi and rails and started on a tussive and Doxycycline but worsened today and was brought in for evaluation. Patient was seen in the ED and his main complaint was that he was hungry. TRH was called to admit for Acute Respiratory Failure with Hypoxia in addition to a CAP.   ED Course: Sepsis protocal was initiated and a CXR, Bloodwork, and a CTA of the Chest was done.   Review of Systems: As per HPI otherwise 10 point review of systems negative. When asked he denied CP, Lightheadedness or leg swelling.   Past Medical History:  Diagnosis Date  . Hypertension   . Polio   . Stroke [facility])     Past Surgical History:  Procedure Laterality Date  . NO PAST SURGERIES     SOCIAL  HISTORY  reports that he has quit smoking. His smoking use included Cigarettes. He has never used smokeless tobacco. He reports that he does not drink alcohol or use drugs.  No Known Allergies  Family History  Problem Relation Age of Onset  . Hypertension Mother   . Hypertension Father    Prior to Admission medications   Medication Sig Start Date End Date Taking? Authorizing Provider  amLODipine (NORVASC) 10 MG tablet Take 1 tablet (10 mg total) by mouth daily. 06/16/15  Yes Rosalin Hawking, MD  aspirin EC 81 MG tablet Take 1 tablet (81 mg total) by mouth daily. 01/02/14  Yes Rosalin Hawking, MD  atorvastatin (LIPITOR) 10 MG tablet Take 10 mg by mouth daily. 11/15/16  Yes [provider]  cloNIDine (CATAPRES) 0.1 MG tablet Take 1 tablet (0.1 mg total) by mouth 3 (three) times daily. 06/16/15  Yes Rosalin Hawking, MD  HYDROcodone-acetaminophen (NORCO/VICODIN) 5-325 MG tablet Take 1 tablet by mouth every 6 (six) hours as needed for moderate pain or severe pain. 06/16/15  Yes Rosalin Hawking, MD  losartan (COZAAR) 100 MG tablet Take 0.5 tablets (50 mg total) by mouth daily. 06/16/15  Yes Rosalin Hawking, MD  mirtazapine (REMERON) 30 MG tablet Take 30 mg by mouth at bedtime.   Yes [provider]  MUCINEX 600 MG 12 hr tablet Take 600 mg by mouth every 12 (twelve) hours. 03/26/14  Yes [provider]  naproxen (NAPROSYN) 500 MG  tablet Take 500 mg by mouth 2 (two) times daily with a meal.   Yes [provider]  NEXIUM 40 MG capsule Take 40 mg by mouth daily.  10/24/14  Yes [provider]  sertraline (ZOLOFT) 100 MG tablet Take 100 mg by mouth daily.   Yes [provider]  TUSSIN 100 MG/5ML syrup Take 10 mLs by mouth every 4 (four) hours as needed for cough. 12/16/16  Yes [provider]   Physical Exam: Vitals:   01/15/17 1530 01/15/17 1600 01/15/17 1624 01/15/17 1630  BP: (!) 154/86 (!) 144/97  (!) 150/95  Pulse: 85 (!) 101  94  Resp: (!) 21 (!) 25  20  Temp:    98.5 F (36.9 C)   TempSrc:   Rectal   SpO2: 99% 100%  100%  Weight:       Constitutional: Thin Male in NAD and appears calm and on BiPAP Eyes: Lids and conjunctivae normal, sclerae anicteric  ENMT: External Ears, Nose appear normal. Grossly normal hearing. Mucous membranes are moist. Poor Dentition  Neck: Appears normal, supple, no cervical masses, normal ROM, no appreciable thyromegaly, no visible JVD Respiratory: Diminished to auscultation bilaterally with rhonchi and rales and minimal wheezing. Slightly increased respiratory effort. No accessory muscle use. Patient on BiPAP.  Cardiovascular: Mildly tachycardic, no murmurs / rubs / gallops. S1 and S2 auscultated. No extremity edema. .  Abdomen: Soft, non-tender, non-distended. No masses palpated. No appreciable hepatosplenomegaly. Bowel sounds positive x4.  GU: Deferred. Musculoskeletal: Has contractures of legs and hands.  Skin: No rashes but has some mild skin abrasions. Has old Sacral ulcers that are healed. No induration; Warm and dry.  Neurologic: CN 2-12 grossly intact with no focal deficits. Romberg sign cerebellar reflexes not assessed.  Psychiatric:  Alert and awake. Normal mood and appropriate affect.   Labs on Admission: I have personally reviewed following labs and imaging studies  CBC:  Recent Labs Lab 01/15/17 1319  WBC 14.2*  NEUTROABS 12.2*  HGB 13.8  HCT 42.6  MCV 86.9  PLT 527   Basic Metabolic Panel:  Recent Labs Lab 01/15/17 1319  NA 146*  K 3.3*  CL 109  CO2 24  GLUCOSE 166*  BUN 19  CREATININE 0.62  CALCIUM 8.4*   GFR: CrCl cannot be calculated (Unknown ideal weight.). Liver Function Tests:  Recent Labs Lab 01/15/17 1319  AST 43*  ALT 14*  ALKPHOS 81  BILITOT 1.2  PROT 7.4  ALBUMIN 2.8*   No results for input(s): LIPASE, AMYLASE in the last 168 hours. No results for input(s): AMMONIA in the last 168 hours. Coagulation Profile:  Recent Labs Lab 01/15/17 1319  INR 1.01    Cardiac Enzymes: No results for input(s): CKTOTAL, CKMB, CKMBINDEX, TROPONINI in the last 168 hours. BNP (last 3 results) No results for input(s): PROBNP in the last 8760 hours. HbA1C: No results for input(s): HGBA1C in the last 72 hours. CBG: No results for input(s): GLUCAP in the last 168 hours. Lipid Profile: No results for input(s): CHOL, HDL, LDLCALC, TRIG, CHOLHDL, LDLDIRECT in the last 72 hours. Thyroid Function Tests: No results for input(s): TSH, T4TOTAL, FREET4, T3FREE, THYROIDAB in the last 72 hours. Anemia Panel: No results for input(s): VITAMINB12, FOLATE, FERRITIN, TIBC, IRON, RETICCTPCT in the last 72 hours. Urine analysis:    Component Value Date/Time   COLORURINE YELLOW 01/15/2017 1302   APPEARANCEUR CLEAR 01/15/2017 1302   LABSPEC 1.014 01/15/2017 1302   PHURINE 7.0 01/15/2017 1302   GLUCOSEU NEGATIVE  01/15/2017 1302   HGBUR SMALL (A) 01/15/2017 1302   BILIRUBINUR NEGATIVE 01/15/2017 1302   KETONESUR NEGATIVE 01/15/2017 1302   PROTEINUR NEGATIVE 01/15/2017 1302   UROBILINOGEN 1.0 12/17/2013 1152   NITRITE NEGATIVE 01/15/2017 1302   LEUKOCYTESUR NEGATIVE 01/15/2017 1302   Sepsis Labs: !!!!!!!!!!!!!!!!!!!!!!!!!!!!!!!!!!!!!!!!!!!! @LABRCNTIP (procalcitonin:4,lacticidven:4) )No results found for this or any previous visit (from the past 240 hour(s)).   Radiological Exams on Admission: Ct Angio Chest Pe W And/or Wo Contrast  Result Date: 01/15/2017 CLINICAL DATA:  Malaise times several days with dyspnea productive cough. Rhonchi. EXAM: CT ANGIOGRAPHY CHEST WITH CONTRAST TECHNIQUE: Multidetector CT imaging of the chest was performed using the standard protocol during bolus administration of intravenous contrast. Multiplanar CT image reconstructions and MIPs were obtained to evaluate the vascular anatomy. CONTRAST:  70 cc Isovue 370 IV COMPARISON:  CXR 01/15/2017 and 12/17/2013 FINDINGS: Cardiovascular: Heart size is normal. There is no pericardial effusion. No  acute pulmonary embolus to the segmental level. The aorta is normal in caliber with minimal aortic atherosclerosis. No dissection. Mediastinum/Nodes: Small precarinal and right hilar subcentimeter short axis lymph nodes are noted. No axillary or supraclavicular lymphadenopathy. The trachea and mainstem bronchi are patent however there are finger-like soft tissue densities within the lobar, segmental and subsegmental branches to the left lower lobe. Lungs/Pleura: Left lower lobe atelectasis due to soft tissue densities within lobar, segmental and subsegmental branches to the left lower lobe. Peribronchial thickening is noted to both lower lobes and lingula. Nonspecific scattered nodular infiltrates are identified to the left upper lobe and lingula. Upper Abdomen: The liver is hypodense in appearance consistent with hepatic steatosis. No acute upper abdominal abnormality. Musculoskeletal: The bones are demineralized in appearance. No acute nor suspicious osseous abnormalities. Review of the MIP images confirms the above findings. IMPRESSION: 1. Left lower lobe atelectasis due to endobronchial soft tissue densities noted of left lower lobe bronchi with differential possibilities more commonly would include mucous plugging though endobronchial lesions are not entirely excluded. 2. Scattered nodular infiltrates in the left upper lobe and lingula, nonspecific possibly related to community-acquired pneumonia such as mycoplasma, fungal infection, or granulomatous disease potentially. Followup PA and lateral chest X-ray is recommended in 3-4 weeks following trial of antibiotic therapy to ensure resolution and exclude underlying malignancy. 3. No acute pulmonary embolus. Aortic Atherosclerosis (ICD10-I70.0). Electronically Signed   By: Ashley Royalty M.D.   On: 01/15/2017 16:02   Dg Chest Portable 1 View  Result Date: 01/15/2017 CLINICAL DATA:  Feeling unwell for the past few days with SOB. Has also had productive cough. EMS  noted rhonchi throughout. SPO2 in 70s with EMS. Increased to 80s on NRB. EXAM: PORTABLE CHEST 1 VIEW COMPARISON:  12/17/2013 FINDINGS: There is mild left basilar atelectasis. There is no focal consolidation. There is no pleural effusion or pneumothorax. The heart and mediastinal contours are unremarkable. The osseous structures are unremarkable. IMPRESSION: No active cardiopulmonary disease. If there is further clinical concern, recommend a two-view chest. Electronically Signed   By: Kathreen Devoid   On: 01/15/2017 13:45    EKG: Independently reviewed. Showed Sinus Rhythm at a rate of 96 with no apparent ST Elevation on my read. Patient did have a prolonged QTc at 508.  Assessment/Plan Active Problems:   Post-poliomyelitis muscular atrophy   Essential hypertension   Contracture of ankle and foot joint   Nontraumatic subcortical hemorrhage of right cerebral hemisphere ([place])   CAP (community acquired pneumonia)   Acute respiratory failure with hypoxia ([place])   Sepsis ([place])  Lactic acidosis   Hypokalemia   HLD (hyperlipidemia)   Prolonged QT interval  Acute Respiratory Failure with Hypoxia requiring NIPPV with BiPAP 2/2 to CAP -Patient was saturating 85% on NRB and presented with Saturations in the 60's -C/w BiPAP and admit to SDU -C/w Supplemental O2 and Wean ans Tolerated -Continuous Pulse Oximetery and Maintain O2 Saturations >92% -Repeat CXR in AM -CT Angio Chest showed Left lower lobe atelectasis due to endobronchial soft tissue densities noted of left lower lobe bronchi with differential possibilities more commonly would include mucous plugging though endobronchial lesions are not entirely excluded. Scattered nodular infiltrates in the left upper lobe and lingula, nonspecific possibly related to community-acquired pneumonia such as mycoplasma, fungal infection, or granulomatous disease potentially. Followup PA and lateral chest X-ray is recommended in 3-4 weeksfollowing trial of  antibiotic therapy to ensure resolution and exclude underlying malignancy. No acute pulmonary embolus.  Community Acquired PNA -Started Patient on BiPAP; Wean as tolerated -CT Angio Chest showed Left lower lobe atelectasis due to endobronchial soft tissue densities noted of left lower lobe bronchi with differential possibilities more commonly would include mucous plugging though endobronchial lesions are not entirely excluded. Scattered nodular infiltrates in the left upper lobe and lingula, nonspecific possibly related to community-acquired pneumonia such as mycoplasma, fungal infection, or granulomatous disease potentially. Followup PA and lateral chest X-ray is recommended in 3-4 weeksfollowing trial of antibiotic therapy to ensure resolution and exclude underlying malignancy. No acute pulmonary embolus -Maintain O2 Saturations >92%; Continuous Pulse Oximetery -C/w Abx as below -DuoNeb q6h -Added IV Steroids with Soulmedrol 40 mg q12h -Flutter Valve and Incentive Spirometer -Repeat CXR in AM -Sputum Cx needs to be sent for Analysis -Check Respiratory Virus Panel -Check Strep and Legionella Urine Ag -Add Guaifenesin  -If not Improving may benefit from Pulmonary Consult   Sepsis 2/2 to Community Acquired PNA -Patient presented with Tachycardia, Tachypnea, Leukocytosis (14.2) with a Focal infection of CAP on CT Chest -Sepsis Bundle utilized and was bolused 1.75 Liters -LA was elevated and went to 4.07; Check Procalcitonin  -Give an additional 1 Liter and start Maintenance Fluid at 75 mL/hr -C/w Abx Coverage for CAP with IV Ceftriaxone and IV Azithromycin -Follow Cx's (Blood, Urine, and Sputum) and Temperature Cureve -Repeat CXR in AM and repeat CBC in AM   Lactic Acidosis -Lactic Acid Level went from 3.69 -> 4.07 -Continue to Trend Lactate -Repeat Bolus 1 Liter -C/w IVF NS at 75 mL/hr  Hypokalemia -Patient's K+ was 3.3 -Replete with IV KCl 30 mEQ -Continue to Monitor and Replete  as Necessary -Repeat CMP as an outpatient   Hypertension -Hold Antihypertensives today given Sepsis and resume in AM -C/w Clonidine for now  HLD -C/w Home Atorvastatin 10 mg po qHS  Hx of CVA -C/w ASA and Statin   Prolonged QTc -QTc was 508; Repeat EKG in AM  -If EKG still shows prolonged QTc may consider changing Azithromycin Abx  DVT prophylaxis: Lovenox 40 mg sq q24h Code Status: FULL CODE Family Communication: Discussed with the daughters at bedside Disposition Plan: Admit to Inpatient; Pending further clinical evaluation Consults called: None Admission status: SDU  Severity of Illness: The appropriate patient status for this patient is INPATIENT. Inpatient status is judged to be reasonable and necessary in order to provide the required intensity of service to ensure the patient's safety. The patient's presenting symptoms, physical exam findings, and initial radiographic and laboratory data in the context of their chronic comorbidities is felt to place them at high risk for  further clinical deterioration. Furthermore, it is not anticipated that the patient will be medically stable for discharge from the hospital within 2 midnights of admission. The following factors support the patient status of inpatient.   " The patient's presenting symptoms include Respiratory Failure. " The worrisome physical exam findings include Tachypnea. " The initial radiographic and laboratory data are worrisome because of Leukocytosis, and Left Lung PNA. " The chronic co-morbidities include HTN, HLD, Hx of CVA, and Hx of Post Polio Syndrome.   * I certify that at the point of admission it is my clinical judgment that the patient will require inpatient hospital care spanning beyond 2 midnights from the point of admission due to high intensity of service, high risk for further deterioration and high frequency of surveillance required.Kerney Elbe, D.O. [facility] Pager  [phone]  If 7PM-7AM, please contact night-coverage [email or web address] Password [facility]  01/15/2017, 5:38 PM

## 2017-01-16 ENCOUNTER — Inpatient Hospital Stay (HOSPITAL_COMMUNITY)

## 2017-01-16 DIAGNOSIS — M24572 Contracture, left ankle: Secondary | ICD-10-CM

## 2017-01-16 DIAGNOSIS — M24575 Contracture, left foot: Secondary | ICD-10-CM

## 2017-01-16 LAB — COMPREHENSIVE METABOLIC PANEL WITH GFR
ALT: 11 U/L — ABNORMAL LOW (ref 17–63)
AST: 27 U/L (ref 15–41)
Albumin: 2.2 g/dL — ABNORMAL LOW (ref 3.5–5.0)
Alkaline Phosphatase: 63 U/L (ref 38–126)
Anion gap: 12 (ref 5–15)
BUN: 13 mg/dL (ref 6–20)
CO2: 19 mmol/L — ABNORMAL LOW (ref 22–32)
Calcium: 7.5 mg/dL — ABNORMAL LOW (ref 8.9–10.3)
Chloride: 113 mmol/L — ABNORMAL HIGH (ref 101–111)
Creatinine, Ser: 0.56 mg/dL — ABNORMAL LOW (ref 0.61–1.24)
GFR calc Af Amer: >60 mL/min
GFR calc non Af Amer: >60 mL/min
Glucose, Bld: 112 mg/dL — ABNORMAL HIGH (ref 65–99)
Potassium: 3.3 mmol/L — ABNORMAL LOW (ref 3.5–5.1)
Sodium: 144 mmol/L (ref 135–145)
Total Bilirubin: 1.1 mg/dL (ref 0.3–1.2)
Total Protein: 5.8 g/dL — ABNORMAL LOW (ref 6.5–8.1)

## 2017-01-16 LAB — CBC
HEMATOCRIT: 36.4 % — AB (ref 39.0–52.0)
Hemoglobin: 12 g/dL — ABNORMAL LOW (ref 13.0–17.0)
MCH: 28.2 pg (ref 26.0–34.0)
MCHC: 33 g/dL (ref 30.0–36.0)
MCV: 85.6 fL (ref 78.0–100.0)
PLATELETS: 226 10*3/uL (ref 150–400)
RBC: 4.25 MIL/uL (ref 4.22–5.81)
RDW: 15.7 % — AB (ref 11.5–15.5)
WBC: 24.6 10*3/uL — ABNORMAL HIGH (ref 4.0–10.5)

## 2017-01-16 LAB — HIV ANTIBODY (ROUTINE TESTING W REFLEX): HIV SCREEN 4TH GENERATION: NONREACTIVE

## 2017-01-16 LAB — PROCALCITONIN: Procalcitonin: 3.58 ng/mL

## 2017-01-16 LAB — STREP PNEUMONIAE URINARY ANTIGEN: Strep Pneumo Urinary Antigen: POSITIVE — AB

## 2017-01-16 LAB — MAGNESIUM: Magnesium: 1.7 mg/dL (ref 1.7–2.4)

## 2017-01-16 MED ORDER — SODIUM CHLORIDE 0.9 % IV SOLN
INTRAVENOUS | Status: DC
  Administered 2017-01-16 (×2): via INTRAVENOUS
  Filled 2017-01-16 (×8): qty 1000

## 2017-01-16 MED ORDER — DEXTROSE 5 % IV SOLN: 500.0000 mg | INTRAVENOUS | Status: DC

## 2017-01-16 MED ORDER — HYDRALAZINE HCL 20 MG/ML IJ SOLN
10.0000 mg | INTRAMUSCULAR | Status: DC | PRN
  Administered 2017-01-16 (×2): 10 mg via INTRAVENOUS
  Filled 2017-01-16 (×2): qty 1

## 2017-01-16 NOTE — Care Management (Signed)
[place] - GIP RN Visit  This is a related and covered admission from 01/15/17 per MD J. Feldmann.  Admission to [facility] on 11/22/16 for Cerebrovascular disease.  Patient Was admitted after presentation to the ED with SOB, worsening dyspnea, and worsening productive cough x1 week with light green colored sputum.  Patient is a FULL CODE.  EMS notes SPO2 in 70's and it increased to 80's on NRB.  ED evaluation noted patient in distress, tachypneic, and with use of accessory muscles.    I visited with patient this AM in his room and his wife Catha Nottingham is present at his bedside.  She is pleased with his improvement since arrival to the ED on yesterday.  Patient is awake and smiling today.  Denies any pain or discomfort.  I have met and spoke with RN Amy today.  She notes no acute concerns at this time, other than possible concern for aspiration for this patient. ST will evaluate today.   Abnormal Labs 01/15/17:  WBC 14.2, Neutroabs 12.2, Na 146, K 3.3, Glucose 166, Calcium 8.4, AST 43, ALT 14, and Albumin 2.8.  Assessment and Plan:  Acute respiratory Failure with Hypoxia requiring NIPPV with BIPAP 2/2 to CAP.  Initially hypoxic requiring 100% FiO2, BiPAP.  Off BiPAP this AM.  Wean O2 as able.  Currently on O2 via [place] at 4L. Continuous pulse oximetry and maintain O2 sats > 92%.  Repeat CXR in AM - currently shows Left lower lobe (LLL) Pneumonia.  CT Angio chest showed Left lower lobe atelectasis due to endobronchial soft tissue densities noted of left lower lobe with differential possibilities more commonly would include mucous plugging though endobronchial lesions are not entirely excluded.  Community acquired Pneumonia (CAP) - likely streptococcal Pneumonia.  He was started on empiric Rocephin and Doxycycline, discontinuation of the Doxycycline as patient probably has streptococcal pneumonia.  ST consult to rule out aspiration.  Maintain O2 sats > 92%; check with continuous  pulse oximetry.  Duoneb q6h, Solumedrol 83m q 12h, Flutter valve and incentive spirometer, Negative respiratory virus panel, (+) Strep pneumonia urinary antigen, Guaifenesin was added, and if not improving he may benefit from a pulmonary consult.    Sepsis 2/2 to community acquired Pneumonia (CAP). Patient presented with tachycardia, tachypnea, leukocytosis (14.2) with a focal infection of CAP on CT chest.  Sepsis bundle utilized and was bolused 1.75 liters. Lactic Acid was elevated and went to 4.07; check procalcitonin.  Give an additional 1 liter and start maintenance fluid at 75 ml/hr.  Continue Abx coverage for CAP with IV Ceftriaxone and IV Azithromycin.  Follow cultures (blood, urine, and sputum) and temperature.  Repeat CXR and CBC in AM (01/17/17).   Lactic Acidosis.  Lactic acid level went from 3.69 - > 4.07.  Continue IV fluids.  Hypokalemia.  Patient K+ 3.3.  Plan is to replete.  HTN.  Hold antihypertensives today given sepsis and resume in AM.  Continue with Clonidine for now.   HLD.  Continue with Atorvastatin 10 mg po qhs.  History of CVA.  Continue with ASA and Statin.  Prolonged QTc.  QTc was 508.  He will have repeat EKG today (01/16/17).  Hospice and PKing Georgewill continue to see this patient daily and will collaborate with hospital staff and anticipate discharge needs.   Please call with any hospice related questions or concerns.  Thank you CClaire Shown WMingo AmberRN MSN HQuitmanRN [phone]

## 2017-01-16 NOTE — Progress Notes (Signed)
RT took the Pt off of BIPAP and placed him on 5L HFNC. RT will continue to monitor

## 2017-01-16 NOTE — Progress Notes (Signed)
When giving patient oral medications, he choked after taking anything by mouth.  Tried to give patient his pills and he choked repeatedly.  Notified doctor, who did not return my text page.  With hold off giving patient anything further by mouth currently. Kymora Sciara Roselie Awkward RN

## 2017-01-16 NOTE — Progress Notes (Addendum)
PMT consult request received  Chart reviewed  Patient seen and examined. Elderly appearing gentleman resting in bed. In no distress.  BP (!) 143/76   Pulse (!) 105   Temp 97.7 F (36.5 C) (Oral)   Resp (!) 30   Ht 5\' 2"  (1.575 m)   Wt 40 kg (88 lb 2.9 oz)   SpO2 100%   BMI 16.13 kg/m  Labs and imaging reviewed.  Discussed with RN.   PLAN: Will discuss with family- wife and daughter, may need interpretor services for full goals of care discussions.  It is noted that the patient is a full code, family reportedly desires aggressive care, patient has been on hospice care for a few months now as well.   Full note and final recommendations to follow Continue current scope of treatment  Thank you for the consult.  Loistine Chance MD [facility] health palliative medicine team [phone]  No charge note

## 2017-01-16 NOTE — Progress Notes (Addendum)
[facility] PROGRESS NOTE  [name] DGU:[id number] DOB: April 08, 1951 DOA: 01/15/2017   PCP: System, Pcp Not In     Assessment/Plan: Active Problems:   Post-poliomyelitis muscular atrophy   Essential hypertension   Contracture of ankle and foot joint   Nontraumatic subcortical hemorrhage of right cerebral hemisphere ([place])   CAP (community acquired pneumonia)   Acute respiratory failure with hypoxia (HCC)   Sepsis (HCC)   Lactic acidosis   Hypokalemia   HLD (hyperlipidemia)   Prolonged QT interval    66 y.o. 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   patient has had SOB with a productive cough and when EMS was called they found the patient to have an SpO2 in the 60's-70's. Patient was brought in and placed on a NRB with improvement in saturations however then ended up on BiPAP. Per family patient has not been febrile and did not complain of any other concerns. Hospice Nurse evaluated yesterday and patient was found to have bilateral rhonchi and rails and started on a tussive and Doxycycline but worsened today and was brought in for evaluation. Patient was seen in the ED and his main complaint was that he was hungry. [facility] was called to admit for Acute Respiratory Failure with Hypoxia in addition to a CAP.   ED Course: Sepsis protocal was initiated and a CXR, Bloodwork, and a CTA of the Chest showed left lower lobe pneumonia with possible mucus plugging    Assessment and plan Acute Respiratory Failure with Hypoxia requiring NIPPV with BiPAP 2/2 to CAP Initially hypoxic requiring 100% FiO2, BiPAP Off BiPAP this morning Wean oxygen as able -Continuous Pulse Oximetery and Maintain O2 Saturations >92% -Repeat CXR in AM-shows left lower lobe pneumonia -CT Angio Chest  showed Left lower lobe atelectasis due to endobronchial soft tissue densities noted of left lower lobe bronchi with differential possibilities more commonly would include mucous plugging though endobronchial lesions are not entirely excluded.   Community Acquired PNA-likely streptococcal pneumonia -Started on empiric Rocephin and doxycycline, discontinue doxycycline as patient probably has streptococcal pneumonia  Speech therapy consultation to rule out aspiration -Maintain O2 Saturations >92%; Continuous Pulse Oximetery -C/w Abx as below -DuoNeb q6h Continue Steroids with Soulmedrol 40 mg q12h -Flutter Valve and Incentive Spirometer -Negative Respiratory Virus Panel -Positive Strep pneumo urinary antigen -Add Guaifenesin  -If not Improving may benefit from Pulmonary Consult     Sepsis 2/2 to Community Acquired PNA -Patient presented with Tachycardia, Tachypnea, Leukocytosis (14.2) with a Focal infection of CAP on CT Chest -Sepsis Bundle utilized and was bolused 1.75 Liters -LA was elevated and went to 4.07; Check Procalcitonin  -Give an additional 1 Liter and start Maintenance Fluid at 75 mL/hr -C/w Abx Coverage for CAP with IV Ceftriaxone and IV Azithromycin -Follow Cx's (Blood, Urine, and Sputum) and Temperature Cureve -Repeat CXR in AM and repeat CBC in AM   Lactic Acidosis -Lactic Acid Level went from 3.69 -> 4.07 Continue IV fluids  Hypokalemia -Patient's K+ was 3.3 Replete  Hypertension -Hold Antihypertensives today given Sepsis and resume in AM -C/w Clonidine for now  HLD -C/w Home Atorvastatin 10 mg po qHS  Hx of CVA -C/w ASA and Statin   Prolonged QTc -QTc was 508; Repeat EKG  today  DVT prophylaxsis  Lovenox  Code Status:   Full code    Code Status Orders       Family Communication: Discussed in detail with the patient, all imaging results, lab results explained to the patient   Disposition Plan: 2- 3  days       Consultants:  None  Procedures:  None  Antibiotics: Anti-infectives    Start     Dose/Rate Route Frequency Ordered Stop   01/16/17 1800  azithromycin (ZITHROMAX) 500 mg in dextrose 5 % 250 mL IVPB  Status:  Discontinued     500 mg 250 mL/hr over 60 Minutes Intravenous Daily-1800 01/15/17 1356 01/15/17 1928   01/16/17 1400  cefTRIAXone (ROCEPHIN) 1 g in dextrose 5 % 50 mL IVPB  Status:  Discontinued     1 g 100 mL/hr over 30 Minutes Intravenous Every 24 hours 01/15/17 1356 01/15/17 1908   01/16/17 1400  cefTRIAXone (ROCEPHIN) 1 g in dextrose 5 % 50 mL IVPB     1 g 100 mL/hr over 30 Minutes Intravenous Every 24 hours 01/15/17 1908 01/22/17 1359   01/15/17 2200  doxycycline (VIBRAMYCIN) 100 mg in dextrose 5 % 250 mL IVPB     100 mg 125 mL/hr over 120 Minutes Intravenous Every 12 hours 01/15/17 2013     01/15/17 1900  cefTRIAXone (ROCEPHIN) 1 g in dextrose 5 % 50 mL IVPB  Status:  Discontinued     1 g 100 mL/hr over 30 Minutes Intravenous Every 24 hours 01/15/17 1849 01/15/17 1908   01/15/17 1900  azithromycin (ZITHROMAX) 500 mg in dextrose 5 % 250 mL IVPB  Status:  Discontinued     500 mg 250 mL/hr over 60 Minutes Intravenous Every 24 hours 01/15/17 1849 01/15/17 2013   01/15/17 1345  cefTRIAXone (ROCEPHIN) 1 g in dextrose 5 % 50 mL IVPB     1 g 100 mL/hr over 30 Minutes Intravenous  Once 01/15/17 1335 01/15/17 1448   01/15/17 1345  azithromycin (ZITHROMAX) 500 mg in dextrose 5 % 250 mL IVPB     500 mg 250 mL/hr over 60 Minutes Intravenous  Once 01/15/17 1335 01/15/17 1505         HPI/Subjective: Patient appears to have improved somewhat, off BiPAP this morning, currently on 5 L of nasal cannula  Objective: Vitals:   01/16/17 0700 01/16/17 0758 01/16/17 0800 01/16/17 0900  BP: (!) 152/85  (!) 177/92 (!) 183/91  Pulse:  (!) 105    Resp: 17  (!) 23 (!) 25  Temp:   97.9 F (36.6 C)   TempSrc:   Axillary   SpO2: 100% 100% 100%   Weight:      Height:         Intake/Output Summary (Last 24 hours) at 01/16/17 1005 Last data filed at 01/16/17 0900  Gross per 24 hour  Intake             3000 ml  Output             1150 ml  Net             1850 ml    Exam:  Examination:  General exam: Appears calm and comfortable  Respiratory system: Clear to auscultation. Respiratory effort normal. Cardiovascular system: S1 & S2 heard, RRR. No JVD, murmurs, rubs, gallops or clicks. No pedal edema. Gastrointestinal system: Abdomen is nondistended, soft and nontender. No organomegaly or masses felt. Normal bowel sounds heard. Central nervous system: Alert and oriented. Multiple contractors  of bilateral upper and lower extremities Extremities: Symmetric 5 x 5 power. Skin: No rashes, lesions or ulcers Psychiatry: Awake, has language barrier    Data Reviewed: I have personally reviewed following labs and imaging studies  Micro Results Recent Results (from the past 240 hour(s))  Respiratory Panel by PCR     Status: None   Collection Time: 01/15/17  5:05 PM  Result Value Ref Range Status   Adenovirus NOT DETECTED NOT DETECTED Final   Coronavirus 229E NOT DETECTED NOT DETECTED Final   Coronavirus HKU1 NOT DETECTED NOT DETECTED Final   Coronavirus NL63 NOT DETECTED NOT DETECTED Final   Coronavirus OC43 NOT DETECTED NOT DETECTED Final   Metapneumovirus NOT DETECTED NOT DETECTED Final   Rhinovirus / Enterovirus NOT DETECTED NOT DETECTED Final   Influenza A NOT DETECTED NOT DETECTED Final   Influenza A H1 NOT DETECTED NOT DETECTED Final   Influenza A H1 2009 NOT DETECTED NOT DETECTED Final   Influenza A H3 NOT DETECTED NOT DETECTED Final   Influenza B NOT DETECTED NOT DETECTED Final   Parainfluenza Virus 1 NOT DETECTED NOT DETECTED Final   Parainfluenza Virus 2 NOT DETECTED NOT DETECTED Final   Parainfluenza Virus 3 NOT DETECTED NOT DETECTED Final   Parainfluenza Virus 4 NOT DETECTED NOT DETECTED Final   Respiratory Syncytial Virus NOT DETECTED NOT  DETECTED Final   Bordetella pertussis NOT DETECTED NOT DETECTED Final   Chlamydophila pneumoniae NOT DETECTED NOT DETECTED Final   Mycoplasma pneumoniae NOT DETECTED NOT DETECTED Final  MRSA PCR Screening     Status: None   Collection Time: 01/15/17  6:45 PM  Result Value Ref Range Status   MRSA by PCR NEGATIVE NEGATIVE Final    Comment:        The GeneXpert MRSA Assay (FDA approved for NASAL specimens only), is one component of a comprehensive MRSA colonization surveillance program. It is not intended to diagnose MRSA infection nor to guide or monitor treatment for MRSA infections.     Radiology Reports Ct Angio Chest Pe W And/or Wo Contrast  Result Date: 01/15/2017 CLINICAL DATA:  Malaise times several days with dyspnea productive cough. Rhonchi. EXAM: CT ANGIOGRAPHY CHEST WITH CONTRAST TECHNIQUE: Multidetector CT imaging of the chest was performed using the standard protocol during bolus administration of intravenous contrast. Multiplanar CT image reconstructions and MIPs were obtained to evaluate the vascular anatomy. CONTRAST:  70 cc Isovue 370 IV COMPARISON:  CXR 01/15/2017 and 12/17/2013 FINDINGS: Cardiovascular: Heart size is normal. There is no pericardial effusion. No acute pulmonary embolus to the segmental level. The aorta is normal in caliber with minimal aortic atherosclerosis. No dissection. Mediastinum/Nodes: Small precarinal and right hilar subcentimeter short axis lymph nodes are noted. No axillary or supraclavicular lymphadenopathy. The trachea and mainstem bronchi are patent however there are finger-like soft tissue densities within the lobar, segmental and subsegmental branches to the left lower lobe. Lungs/Pleura: Left lower lobe atelectasis due to soft tissue densities within lobar, segmental and subsegmental branches to the left lower lobe. Peribronchial thickening is noted to both lower lobes and lingula. Nonspecific scattered nodular infiltrates are identified to the  left upper lobe and lingula. Upper Abdomen: The liver is hypodense in appearance consistent with hepatic steatosis. No acute upper abdominal abnormality. Musculoskeletal: The bones are demineralized in appearance. No acute nor suspicious osseous abnormalities. Review of the MIP images confirms the above findings. IMPRESSION: 1. Left lower lobe atelectasis due to endobronchial soft tissue densities noted of left lower lobe bronchi with  differential possibilities more commonly would include mucous plugging though endobronchial lesions are not entirely excluded. 2. Scattered nodular infiltrates in the left upper lobe and lingula, nonspecific possibly related to community-acquired pneumonia such as mycoplasma, fungal infection, or granulomatous disease potentially. Followup PA and lateral chest X-ray is recommended in 3-4 weeks following trial of antibiotic therapy to ensure resolution and exclude underlying malignancy. 3. No acute pulmonary embolus. Aortic Atherosclerosis (ICD10-I70.0). Electronically Signed   By: Ashley Royalty M.D.   On: 01/15/2017 16:02   Dg Chest Port 1 View  Result Date: 01/16/2017 CLINICAL DATA:  Shortness of breath.  Follow-up exam. EXAM: PORTABLE CHEST 1 VIEW COMPARISON:  01/15/2017 FINDINGS: Left lower lobe collapse and patchy areas of left upper lobe opacity is similar to the previous day's study. Right lung is clear. No pleural effusion.  No pneumothorax. Heart is normal in size. IMPRESSION: 1. No significant change from the previous day's chest radiograph or chest CT. 2. Left lower lobe collapse and patchy areas of left upper lobe infiltrate. Clear right lung. Electronically Signed   By: Lajean Manes M.D.   On: 01/16/2017 08:52   Dg Chest Portable 1 View  Result Date: 01/15/2017 CLINICAL DATA:  Feeling unwell for the past few days with SOB. Has also had productive cough. EMS noted rhonchi throughout. SPO2 in 70s with EMS. Increased to 80s on NRB. EXAM: PORTABLE CHEST 1 VIEW  COMPARISON:  12/17/2013 FINDINGS: There is mild left basilar atelectasis. There is no focal consolidation. There is no pleural effusion or pneumothorax. The heart and mediastinal contours are unremarkable. The osseous structures are unremarkable. IMPRESSION: No active cardiopulmonary disease. If there is further clinical concern, recommend a two-view chest. Electronically Signed   By: Kathreen Devoid   On: 01/15/2017 13:45     CBC  Recent Labs Lab 01/15/17 1319 01/16/17 0337  WBC 14.2* 24.6*  HGB 13.8 12.0*  HCT 42.6 36.4*  PLT 359 226  MCV 86.9 85.6  MCH 28.2 28.2  MCHC 32.4 33.0  RDW 15.9* 15.7*  LYMPHSABS 1.5  --   MONOABS 0.5  --   EOSABS 0.0  --   BASOSABS 0.0  --     Chemistries   Recent Labs Lab 01/15/17 1319 01/16/17 0337  NA 146* 144  K 3.3* 3.3*  CL 109 113*  CO2 24 19*  GLUCOSE 166* 112*  BUN 19 13  CREATININE 0.62 0.56*  CALCIUM 8.4* 7.5*  AST 43* 27  ALT 14* 11*  ALKPHOS 81 63  BILITOT 1.2 1.1   ------------------------------------------------------------------------------------------------------------------ estimated creatinine clearance is 52.1 mL/min (A) (by C-G formula based on SCr of 0.56 mg/dL (L)). ------------------------------------------------------------------------------------------------------------------ No results for input(s): HGBA1C in the last 72 hours. ------------------------------------------------------------------------------------------------------------------ No results for input(s): CHOL, HDL, LDLCALC, TRIG, CHOLHDL, LDLDIRECT in the last 72 hours. ------------------------------------------------------------------------------------------------------------------  Recent Labs  01/15/17 1949  TSH 1.167   ------------------------------------------------------------------------------------------------------------------ No results for input(s): VITAMINB12, FOLATE, FERRITIN, TIBC, IRON, RETICCTPCT in the last 72  hours.  Coagulation profile  Recent Labs Lab 01/15/17 1319  INR 1.01    No results for input(s): DDIMER in the last 72 hours.  Cardiac Enzymes No results for input(s): CKMB, TROPONINI, MYOGLOBIN in the last 168 hours.  Invalid input(s): CK ------------------------------------------------------------------------------------------------------------------ Invalid input(s): POCBNP   CBG: No results for input(s): GLUCAP in the last 168 hours.     Studies: Ct Angio Chest Pe W And/or Wo Contrast  Result Date: 01/15/2017 CLINICAL DATA:  Malaise times several days with dyspnea productive cough. Rhonchi. EXAM:  CT ANGIOGRAPHY CHEST WITH CONTRAST TECHNIQUE: Multidetector CT imaging of the chest was performed using the standard protocol during bolus administration of intravenous contrast. Multiplanar CT image reconstructions and MIPs were obtained to evaluate the vascular anatomy. CONTRAST:  70 cc Isovue 370 IV COMPARISON:  CXR 01/15/2017 and 12/17/2013 FINDINGS: Cardiovascular: Heart size is normal. There is no pericardial effusion. No acute pulmonary embolus to the segmental level. The aorta is normal in caliber with minimal aortic atherosclerosis. No dissection. Mediastinum/Nodes: Small precarinal and right hilar subcentimeter short axis lymph nodes are noted. No axillary or supraclavicular lymphadenopathy. The trachea and mainstem bronchi are patent however there are finger-like soft tissue densities within the lobar, segmental and subsegmental branches to the left lower lobe. Lungs/Pleura: Left lower lobe atelectasis due to soft tissue densities within lobar, segmental and subsegmental branches to the left lower lobe. Peribronchial thickening is noted to both lower lobes and lingula. Nonspecific scattered nodular infiltrates are identified to the left upper lobe and lingula. Upper Abdomen: The liver is hypodense in appearance consistent with hepatic steatosis. No acute upper abdominal  abnormality. Musculoskeletal: The bones are demineralized in appearance. No acute nor suspicious osseous abnormalities. Review of the MIP images confirms the above findings. IMPRESSION: 1. Left lower lobe atelectasis due to endobronchial soft tissue densities noted of left lower lobe bronchi with differential possibilities more commonly would include mucous plugging though endobronchial lesions are not entirely excluded. 2. Scattered nodular infiltrates in the left upper lobe and lingula, nonspecific possibly related to community-acquired pneumonia such as mycoplasma, fungal infection, or granulomatous disease potentially. Followup PA and lateral chest X-ray is recommended in 3-4 weeks following trial of antibiotic therapy to ensure resolution and exclude underlying malignancy. 3. No acute pulmonary embolus. Aortic Atherosclerosis (ICD10-I70.0). Electronically Signed   By: Ashley Royalty M.D.   On: 01/15/2017 16:02   Dg Chest Port 1 View  Result Date: 01/16/2017 CLINICAL DATA:  Shortness of breath.  Follow-up exam. EXAM: PORTABLE CHEST 1 VIEW COMPARISON:  01/15/2017 FINDINGS: Left lower lobe collapse and patchy areas of left upper lobe opacity is similar to the previous day's study. Right lung is clear. No pleural effusion.  No pneumothorax. Heart is normal in size. IMPRESSION: 1. No significant change from the previous day's chest radiograph or chest CT. 2. Left lower lobe collapse and patchy areas of left upper lobe infiltrate. Clear right lung. Electronically Signed   By: Lajean Manes M.D.   On: 01/16/2017 08:52   Dg Chest Portable 1 View  Result Date: 01/15/2017 CLINICAL DATA:  Feeling unwell for the past few days with SOB. Has also had productive cough. EMS noted rhonchi throughout. SPO2 in 70s with EMS. Increased to 80s on NRB. EXAM: PORTABLE CHEST 1 VIEW COMPARISON:  12/17/2013 FINDINGS: There is mild left basilar atelectasis. There is no focal consolidation. There is no pleural effusion or  pneumothorax. The heart and mediastinal contours are unremarkable. The osseous structures are unremarkable. IMPRESSION: No active cardiopulmonary disease. If there is further clinical concern, recommend a two-view chest. Electronically Signed   By: Kathreen Devoid   On: 01/15/2017 13:45      No results found for: HGBA1C Lab Results  Component Value Date   LDLCALC 108 (H) 01/03/2014   CREATININE 0.56 (L) 01/16/2017       Scheduled Meds: . aspirin EC  81 mg Oral Daily  . atorvastatin  10 mg Oral Daily  . chlorhexidine  15 mL Mouth Rinse BID  . cloNIDine  0.1 mg Oral TID  .  enoxaparin (LOVENOX) injection  30 mg Subcutaneous Q24H  . guaiFENesin  1,200 mg Oral BID  . ipratropium-albuterol  3 mL Nebulization Q6H  . mouth rinse  15 mL Mouth Rinse q12n4p  . methylPREDNISolone (SOLU-MEDROL) injection  40 mg Intravenous Q12H  . mirtazapine  30 mg Oral QHS  . sertraline  100 mg Oral Daily   Continuous Infusions: . sodium chloride 75 mL (01/15/17 2155)  . cefTRIAXone (ROCEPHIN)  IV    . doxycycline (VIBRAMYCIN) IV 100 mg (01/16/17 0946)     LOS: 1 day    Time spent: >30 MINS    Reyne Dumas  [facility] Pager [phone]. If 7PM-7AM, please contact night-coverage at [email or web address], password [facility] 01/16/2017, 10:05 AM  LOS: 1 day

## 2017-01-16 NOTE — Progress Notes (Signed)
Fio2 decreased to 85% at this time for saturations that are 100%.

## 2017-01-16 NOTE — Evaluation (Signed)
Clinical/Bedside Swallow Evaluation Patient Details  Name: [name] MRN: [id number] Date of Birth: [birth year]  Today's Date: 01/16/2017 Time: SLP Start Time (ACUTE ONLY): 1339 SLP Stop Time (ACUTE ONLY): 1358 SLP Time Calculation (min) (ACUTE ONLY): 19 min  Past Medical History:  Past Medical History:  Diagnosis Date  . Hypertension   . Polio   . Stroke [facility])    Past Surgical History:  Past Surgical History:  Procedure Laterality Date  . 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 Patient understands some English but history was provided by patient with Daughter translating, and per the patient had worsening Cough (light green colored sputum and mucous) for over a week. Of note the patient is a Hospice patient for unclear reasons but per documentation are managing his chronic medical conditions and remains Full Code. Fort the past week or so patient has had SOB with a productive cough and when EMS was called they found the patient to have an SpO2 in the 60's-70's. Patient was brought in and placed on a NRB with improvement in saturations however then ended up on BiPAP. Per family patient has not been febrile and did not complain of any other concerns. Hospice Nurse evaluated yesterday and patient was found to have bilateral rhonchi and rails and started on a tussive and Doxycycline but worsened today and was brought in for evaluation. Patient was seen in the ED and his main complaint was that he was hungry. TRH was called to admit for Acute Respiratory Failure with Hypoxia in addition to a CAP.    Assessment / Plan / Recommendation Clinical Impression  Clinical swallowing evaluation was completed using thin  liquids only due to concern for aspiration in setting of admission for [facility] and cough.  Most recent chest imaging is showing LLL atelectasis due to endobronchial soft tissues densities ? mucous plugging vs lesion and LLL collapse as well as patchy infilitrates.   Nursing reported that the patient was noted to swallow multiple times with choking/strangling when taking his medications this AM.  Patient is noted to have swelling around his larynx.  May want to consider soft tissue imaging of his neck to determine etiology.  The patient presented with a possible oropharyngeal dysphagia characterized by delayed oral transit, probable issues with oral control, delayed swallow trigger and multiple swallows to clear each bolus.  Immediate strong/long cough response with a drop in O2 sats was noted following small assisted cup sips of thin liquids.  Given response to thin liquids and how long it took the patient to recover exam was ended.  Recommend that the patient remain NPO including medications pending results of MBS next date to fully assess swallowing physiology.     SLP Visit Diagnosis: Dysphagia, oropharyngeal phase (R13.12)    Aspiration Risk  Severe aspiration risk    Diet Recommendation  NPO pending MBS.     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 Patient understands some English but history was provided by  patient with Daughter translating, and per the patient had worsening Cough (light green colored sputum and mucous) for over a week. Of note the patient is a Hospice patient for unclear reasons but per documentation are managing his chronic medical conditions and remains Full Code. Fort the past week or so patient has had SOB with a productive cough and when EMS was called they found the patient to have an SpO2 in the 60's-70's. Patient was brought in and placed on a NRB with improvement in saturations however then ended up on BiPAP. Per family patient has not been febrile and did not complain of any other concerns. Hospice Nurse evaluated yesterday and patient was found to have bilateral rhonchi and rails and started on a tussive and Doxycycline but worsened today and was brought in for evaluation. Patient was seen in the ED and his main complaint was that he was hungry. TRH was called to admit for Acute Respiratory Failure with Hypoxia in addition to a CAP.  Type of Study: Bedside Swallow Evaluation Previous Swallow Assessment: 08/23/2013 with recommendation for regular with thin liquids.   Diet Prior to this Study: NPO Temperature Spikes Noted: No Respiratory Status: Nasal cannula History of Recent Intubation: No Behavior/Cognition: Alert;Cooperative Oral Cavity Assessment: Dry Oral Care Completed by SLP: No Oral Cavity - Dentition: Poor condition;Missing dentition Vision: Functional for self-feeding Self-Feeding Abilities: Needs assist Patient Positioning: Upright in bed Baseline Vocal Quality: Low vocal intensity Volitional Cough: Weak Volitional Swallow: Unable to elicit    Oral/Motor/Sensory Function Overall Oral Motor/Sensory Function:  (Unable to assess - pt not able to follow commands.  )   Ice Chips Ice chips: Not tested   Thin Liquid Thin Liquid: Impaired Presentation: Spoon;Cup;Self Fed Oral Phase Impairments: Impaired mastication Oral Phase Functional Implications: Prolonged oral  transit Pharyngeal  Phase Impairments: Suspected delayed Swallow;Cough - Immediate;Multiple swallows    Nectar Thick Nectar Thick Liquid: Not tested   Honey Thick Honey Thick Liquid: Not tested   Puree Puree: Not tested   Solid   GO   Solid: Not tested        Shelly Flatten, MA, CCC-SLP Acute Rehab SLP [phone] Lamar Sprinkles 01/16/2017,2:01 PM

## 2017-01-17 ENCOUNTER — Inpatient Hospital Stay (HOSPITAL_COMMUNITY)

## 2017-01-17 DIAGNOSIS — Z7189 Other specified counseling: Secondary | ICD-10-CM

## 2017-01-17 DIAGNOSIS — Z515 Encounter for palliative care: Secondary | ICD-10-CM

## 2017-01-17 LAB — COMPREHENSIVE METABOLIC PANEL
ALK PHOS: 58 U/L (ref 38–126)
ALT: 19 U/L (ref 17–63)
AST: 47 U/L — ABNORMAL HIGH (ref 15–41)
Albumin: 2 g/dL — ABNORMAL LOW (ref 3.5–5.0)
Anion gap: 15 (ref 5–15)
BILIRUBIN TOTAL: 0.6 mg/dL (ref 0.3–1.2)
BUN: 12 mg/dL (ref 6–20)
CALCIUM: 7.6 mg/dL — AB (ref 8.9–10.3)
CO2: 18 mmol/L — AB (ref 22–32)
CREATININE: 0.47 mg/dL — AB (ref 0.61–1.24)
Chloride: 111 mmol/L (ref 101–111)
GFR calc non Af Amer: >60 mL/min (ref >60)
Glucose, Bld: 74 mg/dL (ref 65–99)
Potassium: 4 mmol/L (ref 3.5–5.1)
SODIUM: 144 mmol/L (ref 135–145)
TOTAL PROTEIN: 5.7 g/dL — AB (ref 6.5–8.1)

## 2017-01-17 LAB — CBC
HEMATOCRIT: 36.9 % — AB (ref 39.0–52.0)
HEMOGLOBIN: 12 g/dL — AB (ref 13.0–17.0)
MCH: 27.8 pg (ref 26.0–34.0)
MCHC: 32.5 g/dL (ref 30.0–36.0)
MCV: 85.4 fL (ref 78.0–100.0)
Platelets: 234 10*3/uL (ref 150–400)
RBC: 4.32 MIL/uL (ref 4.22–5.81)
RDW: 16.4 % — ABNORMAL HIGH (ref 11.5–15.5)
WBC: 26.2 10*3/uL — ABNORMAL HIGH (ref 4.0–10.5)

## 2017-01-17 LAB — LEGIONELLA PNEUMOPHILA SEROGP 1 UR AG: L. pneumophila Serogp 1 Ur Ag: NEGATIVE

## 2017-01-17 LAB — URINE CULTURE: Culture: NO GROWTH

## 2017-01-17 LAB — PROCALCITONIN: Procalcitonin: 3.5 ng/mL

## 2017-01-17 MED ORDER — MAGNESIUM SULFATE IN D5W 1-5 GM/100ML-% IV SOLN
1.0000 g | Freq: Once | INTRAVENOUS | Status: AC
  Administered 2017-01-17: 1 g via INTRAVENOUS
  Filled 2017-01-17: qty 100

## 2017-01-17 MED ORDER — RESOURCE THICKENUP CLEAR PO POWD
ORAL | Status: DC | PRN
  Filled 2017-01-17: qty 125

## 2017-01-17 MED ORDER — ASPIRIN 81 MG PO CHEW
81.0000 mg | CHEWABLE_TABLET | Freq: Every day | ORAL | Status: DC
  Administered 2017-01-18 – 2017-01-20 (×3): 81 mg via ORAL
  Filled 2017-01-17: qty 1

## 2017-01-17 MED ORDER — GUAIFENESIN 100 MG/5ML PO SOLN
15.0000 mL | ORAL | Status: DC
  Administered 2017-01-17 – 2017-01-20 (×12): 300 mg via ORAL
  Filled 2017-01-17 (×5): qty 10
  Filled 2017-01-17 (×2): qty 20
  Filled 2017-01-17: qty 10
  Filled 2017-01-17: qty 20
  Filled 2017-01-17 (×2): qty 10
  Filled 2017-01-17: qty 20
  Filled 2017-01-17 (×3): qty 10

## 2017-01-17 NOTE — Progress Notes (Signed)
1430---[facility] RN Visit  Met with Dr. Rowe [name], Dr. Allyson [name], wife, son and daughter at bedside with interpreter to discuss Goals of Care. Long discussion ensued. I believe the daughter and the son understand the prognosis with [name] and have concerns regarding his quality of life and care. The patient's wife states that she wishes for the patient to live as long as possible and does not discuss quality of life. Dr. Allyson [name] discussed possible discharge in 1-2 days and conversation was started about caring for patient at home or need for LTC facility. Pt does live at home with son and wife but son expressed concern about ability to care for patient with his needs. The wife does work outside of the home.  No decisions made at this time. Patient remains a full code. Conversations to be continued.  [name] Shillinglaw, [facility] CSW updated with this information.  Thank you, Margaretmary Eddy, RN, [facility] Liaison [phone]  [place] are on AMION.

## 2017-01-17 NOTE — Progress Notes (Signed)
Modified Barium Swallow Progress Note  Patient Details  Name: [name] MRN: [id number] Date of Birth: [birth year]  Today's Date: 01/17/2017  Modified Barium Swallow completed.  Full report located under Chart Review in the Imaging Section.  Brief recommendations include the following:  Clinical Impression    Moderate oral impairments included decreased cohesion and control, delayed posterior propulsion and inconsistent piecemeal transit pattern. He exhibited a mild-moderate motor based pharyngeal dysphagia resulting in vallecular and pyriform residue with decreased effectiveness of multiple swallows at end of study due to possible fatigue. Laryngeal penetration to vocal cords with initial trial of MBS with honey which pt held in oral cavity for significant amount of time. When swallow initiated, fluoroscopy was turned off therefore uncertain if penetration to vocal cords occured prior to initiation or during the swallow. Flash penetration with nectar thick x 1. Mild residue in cervical esophagus possibly due to tight UES. Given pt's lower extremity contractures, unable to lower camera to view esophagus. Recommend Dys 1 (puree) texture, nectar thick liquids, second swallow, pills crushed, sit upright, no straws, full assist and supervision during meals and continued ST.      Swallow Evaluation Recommendations       SLP Diet Recommendations: Dysphagia 1 (Puree) solids;Nectar thick liquid   Liquid Administration via: Cup;No straw   Medication Administration: Crushed with puree   Supervision: Full assist for feeding;Staff to assist with self feeding   Compensations: Slow rate;Small sips/bites;Minimize environmental distractions;Multiple dry swallows after each bite/sip   Postural Changes: Seated upright at 90 degrees   Oral Care Recommendations: Oral care QID        Houston Siren 01/17/2017,3:24 PM   Orbie Pyo Colvin Caroli.Ed [employer] [phone]

## 2017-01-17 NOTE — Progress Notes (Signed)
Initial Nutrition Assessment  DOCUMENTATION CODES:   Underweight  INTERVENTION:  - Diet advancement as medically feasible. - RD will perform nutrition-focused physical exam and attempt to obtain PTA information at the time of follow-up.  NUTRITION DIAGNOSIS:   Inadequate oral intake related to inability to eat as evidenced by NPO status.  GOAL:   Patient will meet greater than or equal to 90% of their needs  MONITOR:   Diet advancement, Weight trends, Labs  REASON FOR ASSESSMENT:   Malnutrition Screening Tool  ASSESSMENT:   66 y.o. male with medical history significant of HTN, HLD, childhood polio and has Post-Polio Syndrome, CVA (Right Basal Ganglia and Thalamic Bleeding) now bed bound with contractures who presented with worsening dyspnea and cough for a week. Patient was brought in and placed on a NRB with improvement in saturations however then ended up on BiPAP. Per family patient has not been febrile and did not complain of any other concerns. Hospice Nurse evaluated yesterday and patient was found to have bilateral rhonchi and rails. Patient was seen in the ED and his main complaint was that he was hungry. [facility] was called to admit for Acute Respiratory Failure with Hypoxia in addition to CAP.  Pt seen for MST. BMI indicates underweight status. Pt has been NPO since admission. In rounds this AM, RN reported plan for MBS early this afternoon and then for family meeting with Palliative Care and Hospice teams at 1:30PM for Lloyd discussion. Pt is non-English speaking although some notes indicate that pt is able to speak and understand English a little bit.   Physical assessment deferred to follow-up. Limited weight hx available but indicates that pt has lost 27 lbs (23% body weight) since 11/19/14. This is not significant for time frame if weight was truly lost over this time frame. Unable to state if pt does or does not meet criteria for malnutrition until further information is  obtained.   Medications reviewed; 1 g IV Mg sulfate x1 run today, 40 mg Solu-medrol BID. Labs reviewed; creatinine: 0.47 mg/dL, Ca: 7.6 mg/dL.   Diet Order:  Diet NPO time specified  Skin:  Reviewed, no issues  Last BM:  10/7  Height:   Ht Readings from Last 1 Encounters:  01/15/17 5\' 2"  (1.575 m)    Weight:   Wt Readings from Last 1 Encounters:  01/15/17 88 lb 2.9 oz (40 kg)    Ideal Body Weight:  53.64 kg  BMI:  Body mass index is 16.13 kg/m.  Estimated Nutritional Needs:   Kcal:  1400-1600 (35-40 kcal/kg)  Protein:  68-76 grams (1.7-1.9 grams/kg)  Fluid:  >/= 1.5 L/day  EDUCATION NEEDS:   No education needs identified at this time    Jarome Matin, MS, RD, LDN, CNSC Inpatient Clinical Dietitian Pager # [phone] After hours/weekend pager # [phone]

## 2017-01-17 NOTE — Consult Note (Signed)
Consultation Note Date: 01/17/2017   Patient Name: [name]  DOB: [birth year]  MRN: [id number]  Age / Sex: 66 y.o., male  PCP: Josetta Huddle, MD Referring Physician: Reyne Dumas, MD  Reason for Consultation: Establishing goals of care  HPI/Patient Profile: 66 y.o. male    admitted on 01/15/2017    Clinical Assessment and Goals of Care:  66 year old [place] gentleman with a past medical history significant for having had polio as a child, post polio syndrome, hypertension dyslipidemia, history of cerebrovascular accident, history of right basal ganglia and thalamic bleed in the past, lives at home has a baseline such that he is bedbound has contractures and presented with worsening dyspnea and cough. Patient is connected through hospice services. Patient initially was noted to be in hypoxic acute respiratory failure requiring BiPAP. Patient was admitted for acute hypoxic respiratory failure deemed secondary to community-acquired pneumonia, possible aspiration pneumonia. CT scan of the chest shows left lower lobe atelectasis, left lower lobe collapse, possible mucus plugging, left upper lobe infiltrate. Serum albumin is 2 g/dL. Patient has been seen and evaluated by speech language pathology with recommendations for the patient to be on pured food and nectar thick liquids.  Palliative care consultation for goals of care discussions.  A family meeting took place with the patient, wife, daughter, son along with [place] interpreter, along with Dr. Frederich Cha from hospital medicine service, Olivia Mackie and is from [facility] [place].  At baseline, the patient is bedbound, dependent on others for premature activities of daily living. He lives at home. Patient's wife is away for work for several hours, he states that they have a friend/family member who comes and stays with the patient. At  times, he is by himself for a few hours every day.  Goals wishes and values attempted to be elicited. Discussed about acute serious medical conditions complicating this hospitalization, the extent of mucus plugging, pneumonia, concerns regarding aspiration. Additionally, gradual progressive decline from history of multiple bilateral lacunar strokes, brain bleed in the past, pseudobulbar effects also discussed in detail. CODE STATUS discussions undertaken in great detail.  All of the patient's family's questions and concerns addressed to the best of my ability. Extensive conversations were held about quality of life and what the patient would want to choose for himself. Discussed frankly that the full scope of her resuscitative attempt would be harmful in this situation. Discussed that the patient unfortunately appears to have been declining, is at high risk for ongoing recurrent aspiration events, recurrent hospitalizations and eventual death.  See recommendations below. Thank you for the consult.  NEXT OF KIN  wife, son and daughter.   SUMMARY OF RECOMMENDATIONS    full code Full scope May need SNF long term Overall goals not consistent with hospice philosophy of care  Code Status/Advance Care Planning:  Full code    Symptom Management:    continue current treatment.   Palliative Prophylaxis:   Bowel Regimen  Additional Recommendations (Limitations, Scope, Preferences):  Full Scope Treatment  Psycho-social/Spiritual:  Desire for further Chaplaincy support:yes  Additional Recommendations: Caregiving  Support/Resources  Prognosis:   Unable to determine  Discharge Planning: To Be Determined      Primary Diagnoses: Present on Admission: . CAP (community acquired pneumonia) . Contracture of ankle and foot joint . Essential hypertension . Nontraumatic subcortical hemorrhage of right cerebral hemisphere [facility])   I have reviewed the medical record, interviewed the  patient and family, and examined the patient. The following aspects are pertinent.  Past Medical History:  Diagnosis Date  . Hypertension   . Polio   . Stroke [facility])    Social History   Social History  . Marital status: Married    Spouse name: Catha Nottingham  . Number of children: 3  . Years of education: 11th   Occupational History  . retired    Social History Main Topics  . Smoking status: Former Smoker    Types: Cigarettes  . Smokeless tobacco: Never Used  . Alcohol use No  . Drug use: No  . Sexual activity: No   Other Topics Concern  . None   Social History Narrative   Patient is currently in [place]   Patient is right handed   Patient drink tea,coffee         Family History  Problem Relation Age of Onset  . Hypertension Mother   . Hypertension Father    Scheduled Meds: . aspirin EC  81 mg Oral Daily  . atorvastatin  10 mg Oral Daily  . chlorhexidine  15 mL Mouth Rinse BID  . cloNIDine  0.1 mg Oral TID  . enoxaparin (LOVENOX) injection  30 mg Subcutaneous Q24H  . guaiFENesin  1,200 mg Oral BID  . ipratropium-albuterol  3 mL Nebulization Q6H  . mouth rinse  15 mL Mouth Rinse q12n4p  . methylPREDNISolone (SOLU-MEDROL) injection  40 mg Intravenous Q12H  . mirtazapine  30 mg Oral QHS  . sertraline  100 mg Oral Daily   Continuous Infusions: . cefTRIAXone (ROCEPHIN)  IV 1 g (01/17/17 1512)  . 0.9 % sodium chloride with kcl 75 mL/hr at 01/17/17 1500   PRN Meds:.hydrALAZINE, RESOURCE THICKENUP CLEAR Medications Prior to Admission:  Prior to Admission medications   Medication Sig Start Date End Date Taking? Authorizing Provider  amLODipine (NORVASC) 10 MG tablet Take 1 tablet (10 mg total) by mouth daily. 06/16/15  Yes Rosalin Hawking, MD  aspirin EC 81 MG tablet Take 1 tablet (81 mg total) by mouth daily. 01/02/14  Yes Rosalin Hawking, MD  atorvastatin (LIPITOR) 10 MG tablet Take 10 mg by mouth daily. 11/15/16  Yes [provider]  cloNIDine (CATAPRES) 0.1 MG tablet  Take 1 tablet (0.1 mg total) by mouth 3 (three) times daily. 06/16/15  Yes Rosalin Hawking, MD  HYDROcodone-acetaminophen (NORCO/VICODIN) 5-325 MG tablet Take 1 tablet by mouth every 6 (six) hours as needed for moderate pain or severe pain. 06/16/15  Yes Rosalin Hawking, MD  losartan (COZAAR) 100 MG tablet Take 0.5 tablets (50 mg total) by mouth daily. 06/16/15  Yes Rosalin Hawking, MD  mirtazapine (REMERON) 30 MG tablet Take 30 mg by mouth at bedtime.   Yes [provider]  MUCINEX 600 MG 12 hr tablet Take 600 mg by mouth every 12 (twelve) hours. 03/26/14  Yes [provider]  naproxen (NAPROSYN) 500 MG tablet Take 500 mg by mouth 2 (two) times daily with a meal.   Yes [provider]  NEXIUM 40 MG capsule Take 40 mg by mouth daily.  10/24/14  Yes [provider]  sertraline (ZOLOFT) 100 MG tablet Take 100 mg by mouth daily.   Yes [provider]  TUSSIN 100 MG/5ML syrup Take 10 mLs by mouth every 4 (four) hours as needed for cough. 12/16/16  Yes [provider]   No Known Allergies Review of Systems +shortness of breath  Physical Exam Awake alert resting in bed Poor oral dentition No overt dyspnea noted S1 S2 Regular Abdomen soft Thin muscle wasting evident  Vital Signs: BP (!) 150/78   Pulse 69   Temp (!) 97.3 F (36.3 C) (Oral)   Resp 19   Ht 5\' 2"  (1.575 m)   Wt 40 kg (88 lb 2.9 oz)   SpO2 96%   BMI 16.13 kg/m  Pain Assessment: No/denies pain   Pain Score: 0-No pain   SpO2: SpO2: 96 % O2 Device:SpO2: 96 % O2 Flow Rate: .O2 Flow Rate (L/min): 3 L/min  IO: Intake/output summary:  Intake/Output Summary (Last 24 hours) at 01/17/17 1520 Last data filed at 01/17/17 1512  Gross per 24 hour  Intake             1950 ml  Output              636 ml  Net             1314 ml   PPS 20% LBM: Last BM Date: 01/16/17 Baseline Weight: Weight: 52.2 kg (115 lb) Most recent weight: Weight: 40 kg (88 lb 2.9 oz)     Palliative  Assessment/Data:     Time In:  1330 Time Out:  1500 Time Total:  90 min  Greater than 50%  of this time was spent counseling and coordinating care related to the above assessment and plan.  Signed by: Loistine Chance, MD  [phone]  Please contact Palliative Medicine Team phone at [phone] for questions and concerns.  For individual provider: See Shea Evans

## 2017-01-17 NOTE — Progress Notes (Signed)
[facility] RN note  This is a related and covered admission from 01/15/17 per MD J. Feldmann.  Admission to [facility] on 11/22/16 for Cerebrovascular disease.  Patient was admitted after presentation to the ED with SOB, worsening dyspnea, and worsening productive cough x1 week with light green colored sputum.  Patient is a FULL CODE. EMS called prior to calling Hospice.  EMS notes SPO2 in 70's and it increased to 80's on NRB.  ED evaluation noted patient in distress, tachypneic, and with use of accessory muscles.  Visited with patient in room this morning. Currently, there are no visitors. Visit limited by language barrier. Pt denies pain and nods that breathing is better.  Pt receiving Rocephin 1 Gm IV Q 24 hours and NaCl 0.9% with 60 mEq potassium chloride at 75 cc/hr. O2 at 3 lpm per [place], sats 100%.  Will try to assist with interpreter for Goals of Care discussion.  Transfer summary and current medication list placed on shadow chart.  [facility] will continue to follow and anticipate discharge needs.  Thank you, Margaretmary Eddy, RN, [facility] Liaison [phone]  [place] are on AMION.

## 2017-01-17 NOTE — Progress Notes (Signed)
   01/17/17 1400  Clinical Encounter Type  Visited With Patient and family together  Visit Type Initial  Spiritual Encounters  Spiritual Needs Emotional   Son and another woman were in the room with the patient.  Son understood English well and said father spoke english.  When I introduced myself he smiled a lot.  I asked the son if they belonged to a faith community and he said no.  I asked permission to pray with the patient and he declined prayer.  Family seemed appreciate I visited.  Palliative Physician indicated he might move to another part of the hospital. Will follow as needed [place]

## 2017-01-17 NOTE — Care Management Note (Signed)
Case Management Note  Patient Details  Name: [name] MRN: [id number] Date of Birth: 1950/11/22  Subjective/Objective:                  Dyspnea and 100% hfnc  Action/Plan: Date:  January 17, 2017 Chart reviewed for concurrent status and case management needs.  Will continue to follow patient progress.  Discharge Planning: following for needs  Expected discharge date: [id number]  Velva Harman, BSN, [place], [place]   Expected Discharge Date:  01/19/17               Expected Discharge Plan:  Home/Self Care  In-House Referral:     Discharge planning Services  CM Consult  Post Acute Care Choice:    Choice offered to:     DME Arranged:    DME Agency:     HH Arranged:    HH Agency:     Status of Service:  In process, will continue to follow  If discussed at Long Length of Stay Meetings, dates discussed:    Additional Comments:  Leeroy Cha, RN 01/17/2017, 8:54 AM

## 2017-01-17 NOTE — Progress Notes (Addendum)
[facility] PROGRESS NOTE  [name] BDZ:[id number] DOB: 05-31-1950 DOA: 01/15/2017   PCP: System, Pcp Not In     Assessment/Plan: Active Problems:   Post-poliomyelitis muscular atrophy   Essential hypertension   Contracture of ankle and foot joint   Nontraumatic subcortical hemorrhage of right cerebral hemisphere ([place])   CAP (community acquired pneumonia)   Acute respiratory failure with hypoxia (HCC)   Sepsis (HCC)   Lactic acidosis   Hypokalemia   HLD (hyperlipidemia)   Prolonged QT interval    66 y.o. 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   patient has had SOB with a productive cough and when EMS was called they found the patient to have an SpO2 in the 60's-70's. Patient was brought in and placed on a NRB with improvement in saturations however then ended up on BiPAP. Per family patient has not been febrile and did not complain of any other concerns. Hospice Nurse evaluated yesterday and patient was found to have bilateral rhonchi and rails and started on a tussive and Doxycycline but worsened today and was brought in for evaluation. Patient was seen in the ED and his main complaint was that he was hungry. [facility] was called to admit for Acute Respiratory Failure with Hypoxia in addition to a CAP.   ED Course: Sepsis protocal was initiated and a CXR, Bloodwork, and a CTA of the Chest showed left lower lobe pneumonia with possible mucus plugging    Assessment and plan Acute Respiratory Failure with Hypoxia requiring NIPPV with BiPAP 2/2 to CAP Initially hypoxic requiring 100% FiO2, currently oxygen flows on 3 L, improving Off BiPAP for 48 hours Wean oxygen as able -Continuous Pulse Oximetery and Maintain O2 Saturations >92% -Repeat CXR   -shows left lower  lobe pneumonia -CT Angio Chest showed Left lower lobe atelectasis due to endobronchial soft tissue densities noted of left lower lobe bronchi with differential possibilities more commonly would include mucous plugging though endobronchial lesions are not entirely excluded. Speech therapy following for possible aspiration issues-Dysphagia 1 (Puree) solids;Nectar thick liquid, remains at risk of recurrent aspiration PNA     Community Acquired PNA-likely streptococcal pneumonia -Started on empiric Rocephin and doxycycline, discontinue doxycycline as patient probably has streptococcal pneumonia  Speech therapy consultation to rule out aspiration -Maintain O2 Saturations >92%; Continuous Pulse Oximetery -C/w Abx as below -DuoNeb q6h Continue Steroids with Soulmedrol 40 mg q12h -Flutter Valve and Incentive Spirometer -Negative Respiratory Virus Panel -Positive Strep pneumo urinary antigen -Add Guaifenesin  -If not Improving may benefit from Pulmonary Consult     Sepsis 2/2 to Community Acquired PNA -Present on admission- Tachycardia, Tachypnea, Leukocytosis (14.2) with a Focal infection of CAP on CT Chest -Sepsis Bundle utilized and was bolused 1.75 Liters -LA was elevated and went to 4.07; Check Procalcitonin  -Give an additional 1 Liter and start Maintenance Fluid at 75 mL/hr -C/w Abx Coverage for CAP with IV Ceftriaxone  , no azithromycin due to QTC prolongation -Follow Cx's (Blood, Urine, and Sputum) and Temperature Cureve -Respiratory panel negative HIV nonreactive Blood culture no growth so far Rule out aspiration in the setting of CVA-MBS pending   Lactic Acidosis -Lactic Acid Level went from 3.69 -> 4.07>3.9 Continue IV fluids  Hypokalemia -Patient's K+ was 3.3>4.0 Replete  Hypertension -  Hold Antihypertensives today given Sepsis and resume in AM -C/w Clonidine for now  HLD -C/w Home Atorvastatin 10 mg po qHS  Hx of CVA -C/w ASA and Statin   Prolonged  QTc -QTc was 508>523, replete magnesium      DVT prophylaxsis  Lovenox  Code Status:   Full code    Code Status Orders       Family Communication: Discussed in detail with the patient's family ,intepreter, hospice nurse, all imaging results, lab results explained to the patient   Disposition Plan: Transfer to telemetry      Consultants:  None  Procedures:  None  Antibiotics: Anti-infectives    Start     Dose/Rate Route Frequency Ordered Stop   01/16/17 1800  azithromycin (ZITHROMAX) 500 mg in dextrose 5 % 250 mL IVPB  Status:  Discontinued     500 mg 250 mL/hr over 60 Minutes Intravenous Daily-1800 01/15/17 1356 01/15/17 1928   01/16/17 1400  cefTRIAXone (ROCEPHIN) 1 g in dextrose 5 % 50 mL IVPB  Status:  Discontinued     1 g 100 mL/hr over 30 Minutes Intravenous Every 24 hours 01/15/17 1356 01/15/17 1908   01/16/17 1400  cefTRIAXone (ROCEPHIN) 1 g in dextrose 5 % 50 mL IVPB     1 g 100 mL/hr over 30 Minutes Intravenous Every 24 hours 01/15/17 1908 01/22/17 1359   01/16/17 1015  azithromycin (ZITHROMAX) 500 mg in dextrose 5 % 250 mL IVPB  Status:  Discontinued     500 mg 250 mL/hr over 60 Minutes Intravenous Every 24 hours 01/16/17 1009 01/16/17 1017   01/15/17 2200  doxycycline (VIBRAMYCIN) 100 mg in dextrose 5 % 250 mL IVPB  Status:  Discontinued     100 mg 125 mL/hr over 120 Minutes Intravenous Every 12 hours 01/15/17 2013 01/16/17 1009   01/15/17 1900  cefTRIAXone (ROCEPHIN) 1 g in dextrose 5 % 50 mL IVPB  Status:  Discontinued     1 g 100 mL/hr over 30 Minutes Intravenous Every 24 hours 01/15/17 1849 01/15/17 1908   01/15/17 1900  azithromycin (ZITHROMAX) 500 mg in dextrose 5 % 250 mL IVPB  Status:  Discontinued     500 mg 250 mL/hr over 60 Minutes Intravenous Every 24 hours 01/15/17 1849 01/15/17 2013   01/15/17 1345  cefTRIAXone (ROCEPHIN) 1 g in dextrose 5 % 50 mL IVPB     1 g 100 mL/hr over 30 Minutes Intravenous  Once 01/15/17 1335 01/15/17 1448    01/15/17 1345  azithromycin (ZITHROMAX) 500 mg in dextrose 5 % 250 mL IVPB     500 mg 250 mL/hr over 60 Minutes Intravenous  Once 01/15/17 1335 01/15/17 1505         HPI/Subjective: Currently on 3 L of nasal cannula, hemodynamically stable  Objective: Vitals:   01/17/17 0900 01/17/17 1000 01/17/17 1100 01/17/17 1200  BP: 139/62 (!) 146/62 (!) 143/70 (!) 147/62  Pulse:      Resp: (!) 37 (!) 25 (!) 21 (!) 23  Temp:    (!) 97.3 F (36.3 C)  TempSrc:    Oral  SpO2: 100%     Weight:      Height:        Intake/Output Summary (Last 24 hours) at 01/17/17 1236 Last data filed at 01/17/17 1200  Gross per 24 hour  Intake             1725 ml  Output  786 ml  Net              939 ml    Exam:  Examination:  General exam: Appears calm and comfortable  Respiratory system: Clear to auscultation. Respiratory effort normal. Cardiovascular system: S1 & S2 heard, RRR. No JVD, murmurs, rubs, gallops or clicks. No pedal edema. Gastrointestinal system: Abdomen is nondistended, soft and nontender. No organomegaly or masses felt. Normal bowel sounds heard. Central nervous system: Language barrier at baseline. Multiple contractors of bilateral upper and lower extremities Extremities: Symmetric 5 x 5 power. Skin: No rashes, lesions or ulcers Psychiatry: Awake, has language barrier    Data Reviewed: I have personally reviewed following labs and imaging studies  Micro Results Recent Results (from the past 240 hour(s))  Urine culture     Status: None   Collection Time: 01/15/17  1:02 PM  Result Value Ref Range Status   Specimen Description URINE, RANDOM  Final   Special Requests NONE  Final   Culture   Final    NO GROWTH Performed at [facility], [address] [place], [place] [address]    Report Status 01/17/2017 FINAL  Final  Culture, blood (Routine x 2)     Status: None (Preliminary result)   Collection Time: 01/15/17  1:19 PM  Result Value Ref Range Status    Specimen Description BLOOD BLOOD LEFT FOREARM  Final   Special Requests   Final    BOTTLES DRAWN AEROBIC AND ANAEROBIC Blood Culture adequate volume   Culture   Final    NO GROWTH < 24 HOURS Performed at [facility], [place] [address] [place], [place] [address]    Report Status PENDING  Incomplete  Culture, blood (Routine x 2)     Status: None (Preliminary result)   Collection Time: 01/15/17  1:43 PM  Result Value Ref Range Status   Specimen Description BLOOD LEFT ANTECUBITAL  Final   Special Requests   Final    BOTTLES DRAWN AEROBIC AND ANAEROBIC Blood Culture results may not be optimal due to an excessive volume of blood received in culture bottles   Culture   Final    NO GROWTH < 24 HOURS Performed at [facility], [place] [address], [place], [place] [address]    Report Status PENDING  Incomplete  Respiratory Panel by PCR     Status: None   Collection Time: 01/15/17  5:05 PM  Result Value Ref Range Status   Adenovirus NOT DETECTED NOT DETECTED Final   Coronavirus 229E NOT DETECTED NOT DETECTED Final   Coronavirus HKU1 NOT DETECTED NOT DETECTED Final   Coronavirus NL63 NOT DETECTED NOT DETECTED Final   Coronavirus OC43 NOT DETECTED NOT DETECTED Final   Metapneumovirus NOT DETECTED NOT DETECTED Final   Rhinovirus / Enterovirus NOT DETECTED NOT DETECTED Final   Influenza A NOT DETECTED NOT DETECTED Final   Influenza A H1 NOT DETECTED NOT DETECTED Final   Influenza A H1 2009 NOT DETECTED NOT DETECTED Final   Influenza A H3 NOT DETECTED NOT DETECTED Final   Influenza B NOT DETECTED NOT DETECTED Final   Parainfluenza Virus 1 NOT DETECTED NOT DETECTED Final   Parainfluenza Virus 2 NOT DETECTED NOT DETECTED Final   Parainfluenza Virus 3 NOT DETECTED NOT DETECTED Final   Parainfluenza Virus 4 NOT DETECTED NOT DETECTED Final   Respiratory Syncytial Virus NOT DETECTED NOT DETECTED Final   Bordetella pertussis NOT DETECTED NOT DETECTED Final   Chlamydophila pneumoniae NOT  DETECTED NOT DETECTED Final   Mycoplasma pneumoniae NOT DETECTED NOT DETECTED Final  MRSA PCR Screening     Status: None   Collection Time: 01/15/17  6:45 PM  Result Value Ref Range Status   MRSA by PCR NEGATIVE NEGATIVE Final    Comment:        The GeneXpert MRSA Assay (FDA approved for NASAL specimens only), is one component of a comprehensive MRSA colonization surveillance program. It is not intended to diagnose MRSA infection nor to guide or monitor treatment for MRSA infections.     Radiology Reports Ct Angio Chest Pe W And/or Wo Contrast  Result Date: 01/15/2017 CLINICAL DATA:  Malaise times several days with dyspnea productive cough. Rhonchi. EXAM: CT ANGIOGRAPHY CHEST WITH CONTRAST TECHNIQUE: Multidetector CT imaging of the chest was performed using the standard protocol during bolus administration of intravenous contrast. Multiplanar CT image reconstructions and MIPs were obtained to evaluate the vascular anatomy. CONTRAST:  70 cc Isovue 370 IV COMPARISON:  CXR 01/15/2017 and 12/17/2013 FINDINGS: Cardiovascular: Heart size is normal. There is no pericardial effusion. No acute pulmonary embolus to the segmental level. The aorta is normal in caliber with minimal aortic atherosclerosis. No dissection. Mediastinum/Nodes: Small precarinal and right hilar subcentimeter short axis lymph nodes are noted. No axillary or supraclavicular lymphadenopathy. The trachea and mainstem bronchi are patent however there are finger-like soft tissue densities within the lobar, segmental and subsegmental branches to the left lower lobe. Lungs/Pleura: Left lower lobe atelectasis due to soft tissue densities within lobar, segmental and subsegmental branches to the left lower lobe. Peribronchial thickening is noted to both lower lobes and lingula. Nonspecific scattered nodular infiltrates are identified to the left upper lobe and lingula. Upper Abdomen: The liver is hypodense in appearance consistent with  hepatic steatosis. No acute upper abdominal abnormality. Musculoskeletal: The bones are demineralized in appearance. No acute nor suspicious osseous abnormalities. Review of the MIP images confirms the above findings. IMPRESSION: 1. Left lower lobe atelectasis due to endobronchial soft tissue densities noted of left lower lobe bronchi with differential possibilities more commonly would include mucous plugging though endobronchial lesions are not entirely excluded. 2. Scattered nodular infiltrates in the left upper lobe and lingula, nonspecific possibly related to community-acquired pneumonia such as mycoplasma, fungal infection, or granulomatous disease potentially. Followup PA and lateral chest X-ray is recommended in 3-4 weeks following trial of antibiotic therapy to ensure resolution and exclude underlying malignancy. 3. No acute pulmonary embolus. Aortic Atherosclerosis (ICD10-I70.0). Electronically Signed   By: Ashley Royalty M.D.   On: 01/15/2017 16:02   Dg Chest Port 1 View  Result Date: 01/16/2017 CLINICAL DATA:  Shortness of breath.  Follow-up exam. EXAM: PORTABLE CHEST 1 VIEW COMPARISON:  01/15/2017 FINDINGS: Left lower lobe collapse and patchy areas of left upper lobe opacity is similar to the previous day's study. Right lung is clear. No pleural effusion.  No pneumothorax. Heart is normal in size. IMPRESSION: 1. No significant change from the previous day's chest radiograph or chest CT. 2. Left lower lobe collapse and patchy areas of left upper lobe infiltrate. Clear right lung. Electronically Signed   By: Lajean Manes M.D.   On: 01/16/2017 08:52   Dg Chest Portable 1 View  Result Date: 01/15/2017 CLINICAL DATA:  Feeling unwell for the past few days with SOB. Has also had productive cough. EMS noted rhonchi throughout. SPO2 in 70s with EMS. Increased to 80s on NRB. EXAM: PORTABLE CHEST 1 VIEW COMPARISON:  12/17/2013 FINDINGS: There is mild left  basilar atelectasis. There is no focal consolidation.  There is no pleural effusion or pneumothorax. The heart and mediastinal contours are unremarkable. The osseous structures are unremarkable. IMPRESSION: No active cardiopulmonary disease. If there is further clinical concern, recommend a two-view chest. Electronically Signed   By: Kathreen Devoid   On: 01/15/2017 13:45     CBC  Recent Labs Lab 01/15/17 1319 01/16/17 0337 01/17/17 0326  WBC 14.2* 24.6* 26.2*  HGB 13.8 12.0* 12.0*  HCT 42.6 36.4* 36.9*  PLT 359 226 234  MCV 86.9 85.6 85.4  MCH 28.2 28.2 27.8  MCHC 32.4 33.0 32.5  RDW 15.9* 15.7* 16.4*  LYMPHSABS 1.5  --   --   MONOABS 0.5  --   --   EOSABS 0.0  --   --   BASOSABS 0.0  --   --     Chemistries   Recent Labs Lab 01/15/17 1319 01/16/17 0337 01/16/17 1150 01/17/17 0326  NA 146* 144  --  144  K 3.3* 3.3*  --  4.0  CL 109 113*  --  111  CO2 24 19*  --  18*  GLUCOSE 166* 112*  --  74  BUN 19 13  --  12  CREATININE 0.62 0.56*  --  0.47*  CALCIUM 8.4* 7.5*  --  7.6*  MG  --   --  1.7  --   AST 43* 27  --  47*  ALT 14* 11*  --  19  ALKPHOS 81 63  --  58  BILITOT 1.2 1.1  --  0.6   ------------------------------------------------------------------------------------------------------------------ estimated creatinine clearance is 52.1 mL/min (A) (by C-G formula based on SCr of 0.47 mg/dL (L)). ------------------------------------------------------------------------------------------------------------------ No results for input(s): HGBA1C in the last 72 hours. ------------------------------------------------------------------------------------------------------------------ No results for input(s): CHOL, HDL, LDLCALC, TRIG, CHOLHDL, LDLDIRECT in the last 72 hours. ------------------------------------------------------------------------------------------------------------------  Recent Labs  01/15/17 1949  TSH 1.167    ------------------------------------------------------------------------------------------------------------------ No results for input(s): VITAMINB12, FOLATE, FERRITIN, TIBC, IRON, RETICCTPCT in the last 72 hours.  Coagulation profile  Recent Labs Lab 01/15/17 1319  INR 1.01    No results for input(s): DDIMER in the last 72 hours.  Cardiac Enzymes No results for input(s): CKMB, TROPONINI, MYOGLOBIN in the last 168 hours.  Invalid input(s): CK ------------------------------------------------------------------------------------------------------------------ Invalid input(s): POCBNP   CBG: No results for input(s): GLUCAP in the last 168 hours.     Studies: Ct Angio Chest Pe W And/or Wo Contrast  Result Date: 01/15/2017 CLINICAL DATA:  Malaise times several days with dyspnea productive cough. Rhonchi. EXAM: CT ANGIOGRAPHY CHEST WITH CONTRAST TECHNIQUE: Multidetector CT imaging of the chest was performed using the standard protocol during bolus administration of intravenous contrast. Multiplanar CT image reconstructions and MIPs were obtained to evaluate the vascular anatomy. CONTRAST:  70 cc Isovue 370 IV COMPARISON:  CXR 01/15/2017 and 12/17/2013 FINDINGS: Cardiovascular: Heart size is normal. There is no pericardial effusion. No acute pulmonary embolus to the segmental level. The aorta is normal in caliber with minimal aortic atherosclerosis. No dissection. Mediastinum/Nodes: Small precarinal and right hilar subcentimeter short axis lymph nodes are noted. No axillary or supraclavicular lymphadenopathy. The trachea and mainstem bronchi are patent however there are finger-like soft tissue densities within the lobar, segmental and subsegmental branches to the left lower lobe. Lungs/Pleura: Left lower lobe atelectasis due to soft tissue densities within lobar, segmental and subsegmental branches to the left lower lobe. Peribronchial thickening is noted to both lower lobes and lingula.  Nonspecific scattered nodular infiltrates are  identified to the left upper lobe and lingula. Upper Abdomen: The liver is hypodense in appearance consistent with hepatic steatosis. No acute upper abdominal abnormality. Musculoskeletal: The bones are demineralized in appearance. No acute nor suspicious osseous abnormalities. Review of the MIP images confirms the above findings. IMPRESSION: 1. Left lower lobe atelectasis due to endobronchial soft tissue densities noted of left lower lobe bronchi with differential possibilities more commonly would include mucous plugging though endobronchial lesions are not entirely excluded. 2. Scattered nodular infiltrates in the left upper lobe and lingula, nonspecific possibly related to community-acquired pneumonia such as mycoplasma, fungal infection, or granulomatous disease potentially. Followup PA and lateral chest X-ray is recommended in 3-4 weeks following trial of antibiotic therapy to ensure resolution and exclude underlying malignancy. 3. No acute pulmonary embolus. Aortic Atherosclerosis (ICD10-I70.0). Electronically Signed   By: Ashley Royalty M.D.   On: 01/15/2017 16:02   Dg Chest Port 1 View  Result Date: 01/16/2017 CLINICAL DATA:  Shortness of breath.  Follow-up exam. EXAM: PORTABLE CHEST 1 VIEW COMPARISON:  01/15/2017 FINDINGS: Left lower lobe collapse and patchy areas of left upper lobe opacity is similar to the previous day's study. Right lung is clear. No pleural effusion.  No pneumothorax. Heart is normal in size. IMPRESSION: 1. No significant change from the previous day's chest radiograph or chest CT. 2. Left lower lobe collapse and patchy areas of left upper lobe infiltrate. Clear right lung. Electronically Signed   By: Lajean Manes M.D.   On: 01/16/2017 08:52   Dg Chest Portable 1 View  Result Date: 01/15/2017 CLINICAL DATA:  Feeling unwell for the past few days with SOB. Has also had productive cough. EMS noted rhonchi throughout. SPO2 in 70s with EMS.  Increased to 80s on NRB. EXAM: PORTABLE CHEST 1 VIEW COMPARISON:  12/17/2013 FINDINGS: There is mild left basilar atelectasis. There is no focal consolidation. There is no pleural effusion or pneumothorax. The heart and mediastinal contours are unremarkable. The osseous structures are unremarkable. IMPRESSION: No active cardiopulmonary disease. If there is further clinical concern, recommend a two-view chest. Electronically Signed   By: Kathreen Devoid   On: 01/15/2017 13:45      No results found for: HGBA1C Lab Results  Component Value Date   LDLCALC 108 (H) 01/03/2014   CREATININE 0.47 (L) 01/17/2017       Scheduled Meds: . aspirin EC  81 mg Oral Daily  . atorvastatin  10 mg Oral Daily  . chlorhexidine  15 mL Mouth Rinse BID  . cloNIDine  0.1 mg Oral TID  . enoxaparin (LOVENOX) injection  30 mg Subcutaneous Q24H  . guaiFENesin  1,200 mg Oral BID  . ipratropium-albuterol  3 mL Nebulization Q6H  . mouth rinse  15 mL Mouth Rinse q12n4p  . methylPREDNISolone (SOLU-MEDROL) injection  40 mg Intravenous Q12H  . mirtazapine  30 mg Oral QHS  . sertraline  100 mg Oral Daily   Continuous Infusions: . cefTRIAXone (ROCEPHIN)  IV Stopped (01/16/17 1430)  . magnesium sulfate 1 - 4 g bolus IVPB    . 0.9 % sodium chloride with kcl 75 mL/hr at 01/17/17 1200     LOS: 2 days    Time spent: >30 MINS    Reyne Dumas  [facility] Pager [phone]. If 7PM-7AM, please contact night-coverage at [email or web address], password [facility] 01/17/2017, 12:36 PM  LOS: 2 days

## 2017-01-18 DIAGNOSIS — J189 Pneumonia, unspecified organism: Secondary | ICD-10-CM

## 2017-01-18 DIAGNOSIS — Z515 Encounter for palliative care: Secondary | ICD-10-CM

## 2017-01-18 DIAGNOSIS — L8990 Pressure ulcer of unspecified site, unspecified stage: Secondary | ICD-10-CM | POA: Diagnosis present

## 2017-01-18 LAB — COMPREHENSIVE METABOLIC PANEL
ALBUMIN: 2 g/dL — AB (ref 3.5–5.0)
ALK PHOS: 67 U/L (ref 38–126)
ALT: 19 U/L (ref 17–63)
ANION GAP: 6 (ref 5–15)
AST: 31 U/L (ref 15–41)
BILIRUBIN TOTAL: 0.5 mg/dL (ref 0.3–1.2)
BUN: 16 mg/dL (ref 6–20)
CALCIUM: 7.9 mg/dL — AB (ref 8.9–10.3)
CO2: 18 mmol/L — ABNORMAL LOW (ref 22–32)
CREATININE: 0.57 mg/dL — AB (ref 0.61–1.24)
Chloride: 124 mmol/L — ABNORMAL HIGH (ref 101–111)
GFR calc Af Amer: >60 mL/min (ref >60)
GFR calc non Af Amer: >60 mL/min (ref >60)
GLUCOSE: 197 mg/dL — AB (ref 65–99)
Potassium: 5.8 mmol/L — ABNORMAL HIGH (ref 3.5–5.1)
Sodium: 148 mmol/L — ABNORMAL HIGH (ref 135–145)
TOTAL PROTEIN: 5.2 g/dL — AB (ref 6.5–8.1)

## 2017-01-18 LAB — CBC
HEMATOCRIT: 29.9 % — AB (ref 39.0–52.0)
HEMOGLOBIN: 9.7 g/dL — AB (ref 13.0–17.0)
MCH: 28.1 pg (ref 26.0–34.0)
MCHC: 32.4 g/dL (ref 30.0–36.0)
MCV: 86.7 fL (ref 78.0–100.0)
Platelets: 253 10*3/uL (ref 150–400)
RBC: 3.45 MIL/uL — AB (ref 4.22–5.81)
RDW: 17.5 % — ABNORMAL HIGH (ref 11.5–15.5)
WBC: 11.5 10*3/uL — ABNORMAL HIGH (ref 4.0–10.5)

## 2017-01-18 LAB — MAGNESIUM: Magnesium: 2 mg/dL (ref 1.7–2.4)

## 2017-01-18 NOTE — Care Management Note (Signed)
Case Management Note  Patient Details  Name: [name] MRN: [id number] Date of Birth: [birth year]  Subjective/Objective: : 66 y/o m admitted w/PNA. From home. DH:DIXBO,ERQ bound. Active w/[facility]. Palliative team following-currently full code. CSW also following.                   Action/Plan:d/c plan home w/hospice.   Expected Discharge Date:  01/19/17               Expected Discharge Plan:  Home w Hospice Care  In-House Referral:     Discharge planning Services  CM Consult  Post Acute Care Choice:  Hospice (Active w/[facility]) Choice offered to:     DME Arranged:    DME Agency:     HH Arranged:    HH Agency:     Status of Service:  In process, will continue to follow  If discussed at Long Length of Stay Meetings, dates discussed:    Additional Comments:  Dessa Phi, RN 01/18/2017, 5:57 PM

## 2017-01-18 NOTE — Progress Notes (Signed)
[facility] PROGRESS NOTE  [name] VOJ:[id number] DOB: 08/25/1950 DOA: 01/15/2017   PCP: Josetta Huddle, MD     Assessment/Plan: Active Problems:   Post-poliomyelitis muscular atrophy   Essential hypertension   Contracture of ankle and foot joint   Nontraumatic subcortical hemorrhage of right cerebral hemisphere ([place])   CAP (community acquired pneumonia)   Acute respiratory failure with hypoxia (HCC)   Sepsis ([place])   Lactic acidosis   Hypokalemia   HLD (hyperlipidemia)   Prolonged QT interval   Encounter for palliative care   Goals of care, counseling/discussion   Pressure injury of skin    66 y.o. 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   patient has had SOB with a productive cough and when EMS was called they found the patient to have an SpO2 in the 60's-70's. Patient was brought in and placed on a NRB with improvement in saturations however then ended up on BiPAP. Per family patient has not been febrile and did not complain of any other concerns. Hospice Nurse evaluated yesterday and patient was found to have bilateral rhonchi and rails and started on a tussive and Doxycycline but worsened  and was brought in for evaluation.  [facility] was called to admit for Acute Respiratory Failure with Hypoxia secondary to aspiration pneumonia. Patient placed on BiPAP and admitted to step down. Improved, transferred out of stepdown on 10/8.     Assessment and plan Acute Respiratory Failure with Hypoxia requiring NIPPV with BiPAP 2/2 to CAP Initially hypoxic requiring 100% FiO2, currently oxygen flows on 3-4 L, improving her prognosis remains guarded Off BiPAP   Wean oxygen as able -Continuous Pulse Oximetery and Maintain O2 Saturations >92% -Repeat CXR   -shows left lower  lobe pneumonia -CT Angio Chest showed Left lower lobe atelectasis due to endobronchial soft tissue densities noted of left lower lobe bronchi with differential possibilities more commonly would include mucous plugging though endobronchial lesions are not entirely excluded. Patient not a candidate for bronchoscopy given tenuous respiratory status. High probability of needing intubation. Now that the patient is improving, further invasive testing has been deferred Speech therapy following for possible aspiration issues-Dysphagia 1 (Puree) solids;Nectar thick liquid, remains at risk of recurrent aspiration PNA     Community Acquired PNA-likely streptococcal pneumonia -Started on empiric Rocephin and doxycycline, discontinue doxycycline as patient probably has streptococcal pneumonia Speech therapy consultation to rule out aspiration Maintain O2 Saturations >92%; Continuous Pulse Oximetery C/w Abx as below DuoNeb q6h Continue Steroids with Soulmedrol 40 mg q12h Flutter Valve and Incentive Spirometer Negative Respiratory Virus Panel Positive Strep pneumo urinary antigen Add Guaifenesin  If not Improving may benefit from Pulmonary Consult     Sepsis 2/2 to Community Acquired PNA -Present on admission- Tachycardia, Tachypnea, Leukocytosis (14.2) with a Focal infection of CAP on CT Chest -Sepsis Bundle utilized and was bolused 1.75 Liters -LA was elevated and went to 4.07; Check Procalcitonin  -C/w Abx Coverage for CAP with IV Ceftriaxone  , no azithromycin due to QTC prolongation, may change to liquid Augmentin at discharge  Blood culture no growth so far, urine culture no growth so far -Respiratory panel negative HIV nonreactive Aspiration playing a huge role.mild-moderate motor based pharyngeal dysphagia per speech therapy likely in the background of prior CVA  Lactic Acidosis -Lactic Acid Level went from 3.69 -> 4.07>3.9    Hypokalemia -Patient's K+ was  3.3>4.0 Repleted  Hypertension -Hold Antihypertensives today given Sepsis and resume in AM -C/w Clonidine for now  HLD -C/w Home Atorvastatin 10 mg po qHS  Hx of CVA -C/w ASA and Statin   Prolonged QTc -QTc was 508>523, replete magnesium      DVT prophylaxsis  Lovenox  Code Status:   Full code    Code Status Orders       Family Communication: Discussed in detail with the patient's family ,intepreter, hospice nurse, all imaging results, lab results explained to the patient . Goals of care not consistent with hospice philosophy  Disposition Plan: Continue telemetry, may need SNF, social work, PT OT consulted      Consultants:  Palliative care  Procedures:  None  Antibiotics: Anti-infectives    Start     Dose/Rate Route Frequency Ordered Stop   01/16/17 1800  azithromycin (ZITHROMAX) 500 mg in dextrose 5 % 250 mL IVPB  Status:  Discontinued     500 mg 250 mL/hr over 60 Minutes Intravenous Daily-1800 01/15/17 1356 01/15/17 1928   01/16/17 1400  cefTRIAXone (ROCEPHIN) 1 g in dextrose 5 % 50 mL IVPB  Status:  Discontinued     1 g 100 mL/hr over 30 Minutes Intravenous Every 24 hours 01/15/17 1356 01/15/17 1908   01/16/17 1400  cefTRIAXone (ROCEPHIN) 1 g in dextrose 5 % 50 mL IVPB     1 g 100 mL/hr over 30 Minutes Intravenous Every 24 hours 01/15/17 1908 01/22/17 1359   01/16/17 1015  azithromycin (ZITHROMAX) 500 mg in dextrose 5 % 250 mL IVPB  Status:  Discontinued     500 mg 250 mL/hr over 60 Minutes Intravenous Every 24 hours 01/16/17 1009 01/16/17 1017   01/15/17 2200  doxycycline (VIBRAMYCIN) 100 mg in dextrose 5 % 250 mL IVPB  Status:  Discontinued     100 mg 125 mL/hr over 120 Minutes Intravenous Every 12 hours 01/15/17 2013 01/16/17 1009   01/15/17 1900  cefTRIAXone (ROCEPHIN) 1 g in dextrose 5 % 50 mL IVPB  Status:  Discontinued     1 g 100 mL/hr over 30 Minutes Intravenous Every 24 hours 01/15/17 1849 01/15/17 1908   01/15/17 1900  azithromycin  (ZITHROMAX) 500 mg in dextrose 5 % 250 mL IVPB  Status:  Discontinued     500 mg 250 mL/hr over 60 Minutes Intravenous Every 24 hours 01/15/17 1849 01/15/17 2013   01/15/17 1345  cefTRIAXone (ROCEPHIN) 1 g in dextrose 5 % 50 mL IVPB     1 g 100 mL/hr over 30 Minutes Intravenous  Once 01/15/17 1335 01/15/17 1448   01/15/17 1345  azithromycin (ZITHROMAX) 500 mg in dextrose 5 % 250 mL IVPB     500 mg 250 mL/hr over 60 Minutes Intravenous  Once 01/15/17 1335 01/15/17 1505         HPI/Subjective: Minimally arousal to voice but arousal to sternal rub   Objective: Vitals:   01/17/17 2112 01/17/17 2125 01/18/17 0426 01/18/17 0835  BP: 132/67  (!) 142/94   Pulse: 65  (!) 104   Resp: 18  18   Temp: 98.4 F (36.9 C)  98.6 F (37 C)   TempSrc: Oral  Oral   SpO2: 100% 98% 99% 100%  Weight:      Height:        Intake/Output Summary (Last 24 hours) at 01/18/17 0845 Last data filed at  01/18/17 0600  Gross per 24 hour  Intake             2040 ml  Output              225 ml  Net             1815 ml    Exam:  Examination:  General exam: Appears calm and comfortable  Respiratory system: Clear to auscultation. Respiratory effort normal. Cardiovascular system: S1 & S2 heard, RRR. No JVD, murmurs, rubs, gallops or clicks. No pedal edema. Gastrointestinal system: Abdomen is nondistended, soft and nontender. No organomegaly or masses felt. Normal bowel sounds heard. Central nervous system: Language barrier at baseline. Multiple contractors of bilateral upper and lower extremities Extremities: Symmetric 5 x 5 power. Skin: No rashes, lesions or ulcers Psychiatry: Awake, has language barrier    Data Reviewed: I have personally reviewed following labs and imaging studies  Micro Results Recent Results (from the past 240 hour(s))  Urine culture     Status: None   Collection Time: 01/15/17  1:02 PM  Result Value Ref Range Status   Specimen Description URINE, RANDOM  Final   Special  Requests NONE  Final   Culture   Final    NO GROWTH Performed at [facility], [address] [place], [place] [address]    Report Status 01/17/2017 FINAL  Final  Culture, blood (Routine x 2)     Status: None (Preliminary result)   Collection Time: 01/15/17  1:19 PM  Result Value Ref Range Status   Specimen Description BLOOD BLOOD LEFT FOREARM  Final   Special Requests   Final    BOTTLES DRAWN AEROBIC AND ANAEROBIC Blood Culture adequate volume   Culture   Final    NO GROWTH 2 DAYS Performed at [facility], [place] [address], [place], [place] [address]    Report Status PENDING  Incomplete  Culture, blood (Routine x 2)     Status: None (Preliminary result)   Collection Time: 01/15/17  1:43 PM  Result Value Ref Range Status   Specimen Description BLOOD LEFT ANTECUBITAL  Final   Special Requests   Final    BOTTLES DRAWN AEROBIC AND ANAEROBIC Blood Culture results may not be optimal due to an excessive volume of blood received in culture bottles   Culture   Final    NO GROWTH 2 DAYS Performed at [facility], [place] [address], [place], [place] [address]    Report Status PENDING  Incomplete  Respiratory Panel by PCR     Status: None   Collection Time: 01/15/17  5:05 PM  Result Value Ref Range Status   Adenovirus NOT DETECTED NOT DETECTED Final   Coronavirus 229E NOT DETECTED NOT DETECTED Final   Coronavirus HKU1 NOT DETECTED NOT DETECTED Final   Coronavirus NL63 NOT DETECTED NOT DETECTED Final   Coronavirus OC43 NOT DETECTED NOT DETECTED Final   Metapneumovirus NOT DETECTED NOT DETECTED Final   Rhinovirus / Enterovirus NOT DETECTED NOT DETECTED Final   Influenza A NOT DETECTED NOT DETECTED Final   Influenza A H1 NOT DETECTED NOT DETECTED Final   Influenza A H1 2009 NOT DETECTED NOT DETECTED Final   Influenza A H3 NOT DETECTED NOT DETECTED Final   Influenza B NOT DETECTED NOT DETECTED Final   Parainfluenza Virus 1 NOT DETECTED NOT DETECTED Final   Parainfluenza  Virus 2 NOT DETECTED NOT DETECTED Final   Parainfluenza Virus 3 NOT DETECTED NOT  DETECTED Final   Parainfluenza Virus 4 NOT DETECTED NOT DETECTED Final   Respiratory Syncytial Virus NOT DETECTED NOT DETECTED Final   Bordetella pertussis NOT DETECTED NOT DETECTED Final   Chlamydophila pneumoniae NOT DETECTED NOT DETECTED Final   Mycoplasma pneumoniae NOT DETECTED NOT DETECTED Final  MRSA PCR Screening     Status: None   Collection Time: 01/15/17  6:45 PM  Result Value Ref Range Status   MRSA by PCR NEGATIVE NEGATIVE Final    Comment:        The GeneXpert MRSA Assay (FDA approved for NASAL specimens only), is one component of a comprehensive MRSA colonization surveillance program. It is not intended to diagnose MRSA infection nor to guide or monitor treatment for MRSA infections.     Radiology Reports Ct Angio Chest Pe W And/or Wo Contrast  Result Date: 01/15/2017 CLINICAL DATA:  Malaise times several days with dyspnea productive cough. Rhonchi. EXAM: CT ANGIOGRAPHY CHEST WITH CONTRAST TECHNIQUE: Multidetector CT imaging of the chest was performed using the standard protocol during bolus administration of intravenous contrast. Multiplanar CT image reconstructions and MIPs were obtained to evaluate the vascular anatomy. CONTRAST:  70 cc Isovue 370 IV COMPARISON:  CXR 01/15/2017 and 12/17/2013 FINDINGS: Cardiovascular: Heart size is normal. There is no pericardial effusion. No acute pulmonary embolus to the segmental level. The aorta is normal in caliber with minimal aortic atherosclerosis. No dissection. Mediastinum/Nodes: Small precarinal and right hilar subcentimeter short axis lymph nodes are noted. No axillary or supraclavicular lymphadenopathy. The trachea and mainstem bronchi are patent however there are finger-like soft tissue densities within the lobar, segmental and subsegmental branches to the left lower lobe. Lungs/Pleura: Left lower lobe atelectasis due to soft tissue densities  within lobar, segmental and subsegmental branches to the left lower lobe. Peribronchial thickening is noted to both lower lobes and lingula. Nonspecific scattered nodular infiltrates are identified to the left upper lobe and lingula. Upper Abdomen: The liver is hypodense in appearance consistent with hepatic steatosis. No acute upper abdominal abnormality. Musculoskeletal: The bones are demineralized in appearance. No acute nor suspicious osseous abnormalities. Review of the MIP images confirms the above findings. IMPRESSION: 1. Left lower lobe atelectasis due to endobronchial soft tissue densities noted of left lower lobe bronchi with differential possibilities more commonly would include mucous plugging though endobronchial lesions are not entirely excluded. 2. Scattered nodular infiltrates in the left upper lobe and lingula, nonspecific possibly related to community-acquired pneumonia such as mycoplasma, fungal infection, or granulomatous disease potentially. Followup PA and lateral chest X-ray is recommended in 3-4 weeks following trial of antibiotic therapy to ensure resolution and exclude underlying malignancy. 3. No acute pulmonary embolus. Aortic Atherosclerosis (ICD10-I70.0). Electronically Signed   By: Ashley Royalty M.D.   On: 01/15/2017 16:02   Dg Chest Port 1 View  Result Date: 01/16/2017 CLINICAL DATA:  Shortness of breath.  Follow-up exam. EXAM: PORTABLE CHEST 1 VIEW COMPARISON:  01/15/2017 FINDINGS: Left lower lobe collapse and patchy areas of left upper lobe opacity is similar to the previous day's study. Right lung is clear. No pleural effusion.  No pneumothorax. Heart is normal in size. IMPRESSION: 1. No significant change from the previous day's chest radiograph or chest CT. 2. Left lower lobe collapse and patchy areas of left upper lobe infiltrate. Clear right lung. Electronically Signed   By: Lajean Manes M.D.   On: 01/16/2017 08:52   Dg Chest Portable 1 View  Result Date:  01/15/2017 CLINICAL DATA:  Feeling unwell for the  past few days with SOB. Has also had productive cough. EMS noted rhonchi throughout. SPO2 in 70s with EMS. Increased to 80s on NRB. EXAM: PORTABLE CHEST 1 VIEW COMPARISON:  12/17/2013 FINDINGS: There is mild left basilar atelectasis. There is no focal consolidation. There is no pleural effusion or pneumothorax. The heart and mediastinal contours are unremarkable. The osseous structures are unremarkable. IMPRESSION: No active cardiopulmonary disease. If there is further clinical concern, recommend a two-view chest. Electronically Signed   By: Kathreen Devoid   On: 01/15/2017 13:45   Dg Swallowing Func-speech Pathology  Result Date: 01/17/2017 Objective Swallowing Evaluation: Type of Study: MBS-Modified Barium Swallow Study Patient Details Name: [name] MRN: [id number] Date of Birth: 1950/12/09 Today's Date: 01/17/2017 Time: SLP Start Time (ACUTE ONLY): 1215-SLP Stop Time (ACUTE ONLY): 1240 SLP Time Calculation (min) (ACUTE ONLY): 25 min Past Medical History: Past Medical History: Diagnosis Date . Hypertension  . Polio  . Stroke [facility])  Past Surgical History: Past Surgical History: Procedure Laterality Date . NO PAST SURGERIES   HPI: Orlandus Phanis a [place] 66 y.o.malewith medical history significant of HTN, HLD, childhood Polio, post Polio Syndrome and CVA (Right Basal Ganglia and Thalamic Bleeding), bed bound with contractures who presented with worsening dyspnea and cough for a week. Pt found to have acute respiratory failure with hypoxia, community acquired PNA, sepsis d/t pna. CXR 10/7 left lower lobe collapse and patchy areas of left upper lobe infiltrate. Clear right lung. BSE recommended MBS. Subjective: The patient was seen sitting upright in bed.  Assessment / Plan / Recommendation CHL IP CLINICAL IMPRESSIONS 01/17/2017 Clinical Impression   Moderate oral impairments included decreased cohesion and control, delayed posterior propulsion and inconsistent  piecemeal transit pattern. He exhibited a mild-moderate motor based pharyngeal dysphagia resulting in vallecular and pyriform residue with decreased effectiveness of multiple swallows at end of study due to possible fatigue. Laryngeal penetration to vocal cords with initial trial of MBS with honey which pt held in oral cavity for significant amount of time. When swallow initiated, fluoroscopy was turned off therefore uncertain if penetration to vocal cords occured prior to initiation or during the swallow. Flash penetration with nectar thick x 1. Mild residue in cervical esophagus possibly due to tight UES. Given pt's lower extremity contractures, unable to lower camera to view esophagus. Recommend Dys 1 (puree) texture, nectar thick liquids, second swallow, pills crushed, sit upright, no straws, full assist and supervision during meals and continued ST.    SLP Visit Diagnosis Dysphagia, oropharyngeal phase (R13.12) Attention and concentration deficit following -- Frontal lobe and executive function deficit following -- Impact on safety and function (No Data)   CHL IP TREATMENT RECOMMENDATION 01/17/2017 Treatment Recommendations Therapy as outlined in treatment plan below   Prognosis 01/17/2017 Prognosis for Safe Diet Advancement Fair Barriers to Reach Goals -- Barriers/Prognosis Comment -- CHL IP DIET RECOMMENDATION 01/17/2017 SLP Diet Recommendations Dysphagia 1 (Puree) solids;Nectar thick liquid Liquid Administration via Cup;No straw Medication Administration Crushed with puree Compensations Slow rate;Small sips/bites;Minimize environmental distractions;Multiple dry swallows after each bite/sip Postural Changes Seated upright at 90 degrees   CHL IP OTHER RECOMMENDATIONS 01/17/2017 Recommended Consults -- Oral Care Recommendations Oral care QID Other Recommendations --   CHL IP FOLLOW UP RECOMMENDATIONS 01/17/2017 Follow up Recommendations (No Data)   CHL IP FREQUENCY AND DURATION 01/17/2017 Speech Therapy Frequency  (ACUTE ONLY) min 2x/week Treatment Duration 2 weeks      CHL IP ORAL PHASE 01/17/2017 Oral Phase Impaired Oral - Pudding Teaspoon -- Oral -  Pudding Cup -- Oral - Honey Teaspoon -- Oral - Honey Cup Decreased bolus cohesion;Delayed oral transit;Piecemeal swallowing;Reduced posterior propulsion Oral - Nectar Teaspoon -- Oral - Nectar Cup Decreased bolus cohesion;Delayed oral transit;Reduced posterior propulsion Oral - Nectar Straw -- Oral - Thin Teaspoon -- Oral - Thin Cup Decreased bolus cohesion;Delayed oral transit;Reduced posterior propulsion Oral - Thin Straw -- Oral - Puree Decreased bolus cohesion;Delayed oral transit;Reduced posterior propulsion Oral - Mech Soft -- Oral - Regular -- Oral - Multi-Consistency -- Oral - Pill -- Oral Phase - Comment --  CHL IP PHARYNGEAL PHASE 01/17/2017 Pharyngeal Phase Impaired Pharyngeal- Pudding Teaspoon -- Pharyngeal -- Pharyngeal- Pudding Cup -- Pharyngeal -- Pharyngeal- Honey Teaspoon -- Pharyngeal -- Pharyngeal- Honey Cup Penetration/Aspiration during swallow;Pharyngeal residue - valleculae;Pharyngeal residue - pyriform;Reduced anterior laryngeal mobility;Reduced laryngeal elevation Pharyngeal Material enters airway, CONTACTS cords and not ejected out Pharyngeal- Nectar Teaspoon -- Pharyngeal -- Pharyngeal- Nectar Cup Pharyngeal residue - pyriform;Pharyngeal residue - valleculae;Penetration/Aspiration during swallow Pharyngeal Material enters airway, remains ABOVE vocal cords then ejected out Pharyngeal- Nectar Straw -- Pharyngeal -- Pharyngeal- Thin Teaspoon -- Pharyngeal -- Pharyngeal- Thin Cup Pharyngeal residue - pyriform;Pharyngeal residue - valleculae Pharyngeal -- Pharyngeal- Thin Straw -- Pharyngeal -- Pharyngeal- Puree Pharyngeal residue - valleculae;Pharyngeal residue - pyriform Pharyngeal -- Pharyngeal- Mechanical Soft -- Pharyngeal -- Pharyngeal- Regular -- Pharyngeal -- Pharyngeal- Multi-consistency -- Pharyngeal -- Pharyngeal- Pill -- Pharyngeal -- Pharyngeal  Comment --  CHL IP CERVICAL ESOPHAGEAL PHASE 01/17/2017 Cervical Esophageal Phase Impaired Pudding Teaspoon -- Pudding Cup -- Honey Teaspoon -- Honey Cup -- Nectar Teaspoon -- Nectar Cup -- Nectar Straw -- Thin Teaspoon -- Thin Cup -- Thin Straw -- Puree -- Mechanical Soft -- Regular -- Multi-consistency -- Pill -- Cervical Esophageal Comment -- No flowsheet data found. Houston Siren 01/17/2017, 3:24 PM Orbie Pyo Colvin Caroli.Ed CCC-SLP Pager [phone]                CBC  Recent Labs Lab 01/15/17 1319 01/16/17 0337 01/17/17 0326  WBC 14.2* 24.6* 26.2*  HGB 13.8 12.0* 12.0*  HCT 42.6 36.4* 36.9*  PLT 359 226 234  MCV 86.9 85.6 85.4  MCH 28.2 28.2 27.8  MCHC 32.4 33.0 32.5  RDW 15.9* 15.7* 16.4*  LYMPHSABS 1.5  --   --   MONOABS 0.5  --   --   EOSABS 0.0  --   --   BASOSABS 0.0  --   --     Chemistries   Recent Labs Lab 01/15/17 1319 01/16/17 0337 01/16/17 1150 01/17/17 0326  NA 146* 144  --  144  K 3.3* 3.3*  --  4.0  CL 109 113*  --  111  CO2 24 19*  --  18*  GLUCOSE 166* 112*  --  74  BUN 19 13  --  12  CREATININE 0.62 0.56*  --  0.47*  CALCIUM 8.4* 7.5*  --  7.6*  MG  --   --  1.7  --   AST 43* 27  --  47*  ALT 14* 11*  --  19  ALKPHOS 81 63  --  58  BILITOT 1.2 1.1  --  0.6   ------------------------------------------------------------------------------------------------------------------ estimated creatinine clearance is 52.1 mL/min (A) (by C-G formula based on SCr of 0.47 mg/dL (L)). ------------------------------------------------------------------------------------------------------------------ No results for input(s): HGBA1C in the last 72 hours. ------------------------------------------------------------------------------------------------------------------ No results for input(s): CHOL, HDL, LDLCALC, TRIG, CHOLHDL, LDLDIRECT in the last 72  hours. ------------------------------------------------------------------------------------------------------------------  Recent Labs  01/15/17 1949  TSH  1.167   ------------------------------------------------------------------------------------------------------------------ No results for input(s): VITAMINB12, FOLATE, FERRITIN, TIBC, IRON, RETICCTPCT in the last 72 hours.  Coagulation profile  Recent Labs Lab 01/15/17 1319  INR 1.01    No results for input(s): DDIMER in the last 72 hours.  Cardiac Enzymes No results for input(s): CKMB, TROPONINI, MYOGLOBIN in the last 168 hours.  Invalid input(s): CK ------------------------------------------------------------------------------------------------------------------ Invalid input(s): POCBNP   CBG: No results for input(s): GLUCAP in the last 168 hours.     Studies: Dg Chest Port 1 View  Result Date: 01/16/2017 CLINICAL DATA:  Shortness of breath.  Follow-up exam. EXAM: PORTABLE CHEST 1 VIEW COMPARISON:  01/15/2017 FINDINGS: Left lower lobe collapse and patchy areas of left upper lobe opacity is similar to the previous day's study. Right lung is clear. No pleural effusion.  No pneumothorax. Heart is normal in size. IMPRESSION: 1. No significant change from the previous day's chest radiograph or chest CT. 2. Left lower lobe collapse and patchy areas of left upper lobe infiltrate. Clear right lung. Electronically Signed   By: Lajean Manes M.D.   On: 01/16/2017 08:52   Dg Swallowing Func-speech Pathology  Result Date: 01/17/2017 Objective Swallowing Evaluation: Type of Study: MBS-Modified Barium Swallow Study Patient Details Name: [name] MRN: [id number] Date of Birth: 08-Jul-1950 Today's Date: 01/17/2017 Time: SLP Start Time (ACUTE ONLY): 1215-SLP Stop Time (ACUTE ONLY): 1240 SLP Time Calculation (min) (ACUTE ONLY): 25 min Past Medical History: Past Medical History: Diagnosis Date . Hypertension  . Polio  . Stroke [facility])  Past  Surgical History: Past Surgical History: Procedure Laterality Date . NO PAST SURGERIES   HPI: Kiaan Phanis a [place] 66 y.o.malewith medical history significant of HTN, HLD, childhood Polio, post Polio Syndrome and CVA (Right Basal Ganglia and Thalamic Bleeding), bed bound with contractures who presented with worsening dyspnea and cough for a week. Pt found to have acute respiratory failure with hypoxia, community acquired PNA, sepsis d/t pna. CXR 10/7 left lower lobe collapse and patchy areas of left upper lobe infiltrate. Clear right lung. BSE recommended MBS. Subjective: The patient was seen sitting upright in bed.  Assessment / Plan / Recommendation CHL IP CLINICAL IMPRESSIONS 01/17/2017 Clinical Impression   Moderate oral impairments included decreased cohesion and control, delayed posterior propulsion and inconsistent piecemeal transit pattern. He exhibited a mild-moderate motor based pharyngeal dysphagia resulting in vallecular and pyriform residue with decreased effectiveness of multiple swallows at end of study due to possible fatigue. Laryngeal penetration to vocal cords with initial trial of MBS with honey which pt held in oral cavity for significant amount of time. When swallow initiated, fluoroscopy was turned off therefore uncertain if penetration to vocal cords occured prior to initiation or during the swallow. Flash penetration with nectar thick x 1. Mild residue in cervical esophagus possibly due to tight UES. Given pt's lower extremity contractures, unable to lower camera to view esophagus. Recommend Dys 1 (puree) texture, nectar thick liquids, second swallow, pills crushed, sit upright, no straws, full assist and supervision during meals and continued ST.    SLP Visit Diagnosis Dysphagia, oropharyngeal phase (R13.12) Attention and concentration deficit following -- Frontal lobe and executive function deficit following -- Impact on safety and function (No Data)   CHL IP TREATMENT  RECOMMENDATION 01/17/2017 Treatment Recommendations Therapy as outlined in treatment plan below   Prognosis 01/17/2017 Prognosis for Safe Diet Advancement Fair Barriers to Reach Goals -- Barriers/Prognosis Comment -- CHL IP DIET RECOMMENDATION 01/17/2017 SLP Diet Recommendations Dysphagia 1 (Puree) solids;Nectar thick liquid  Liquid Administration via [employer];No straw Medication Administration Crushed with puree Compensations Slow rate;Small sips/bites;Minimize environmental distractions;Multiple dry swallows after each bite/sip Postural Changes Seated upright at 90 degrees   CHL IP OTHER RECOMMENDATIONS 01/17/2017 Recommended Consults -- Oral Care Recommendations Oral care QID Other Recommendations --   CHL IP FOLLOW UP RECOMMENDATIONS 01/17/2017 Follow up Recommendations (No Data)   CHL IP FREQUENCY AND DURATION 01/17/2017 Speech Therapy Frequency (ACUTE ONLY) min 2x/week Treatment Duration 2 weeks      CHL IP ORAL PHASE 01/17/2017 Oral Phase Impaired Oral - Pudding Teaspoon -- Oral - Pudding Cup -- Oral - Honey Teaspoon -- Oral - Honey Cup Decreased bolus cohesion;Delayed oral transit;Piecemeal swallowing;Reduced posterior propulsion Oral - Nectar Teaspoon -- Oral - Nectar Cup Decreased bolus cohesion;Delayed oral transit;Reduced posterior propulsion Oral - Nectar Straw -- Oral - Thin Teaspoon -- Oral - Thin Cup Decreased bolus cohesion;Delayed oral transit;Reduced posterior propulsion Oral - Thin Straw -- Oral - Puree Decreased bolus cohesion;Delayed oral transit;Reduced posterior propulsion Oral - Mech Soft -- Oral - Regular -- Oral - Multi-Consistency -- Oral - Pill -- Oral Phase - Comment --  CHL IP PHARYNGEAL PHASE 01/17/2017 Pharyngeal Phase Impaired Pharyngeal- Pudding Teaspoon -- Pharyngeal -- Pharyngeal- Pudding Cup -- Pharyngeal -- Pharyngeal- Honey Teaspoon -- Pharyngeal -- Pharyngeal- Honey Cup Penetration/Aspiration during swallow;Pharyngeal residue - valleculae;Pharyngeal residue - pyriform;Reduced anterior  laryngeal mobility;Reduced laryngeal elevation Pharyngeal Material enters airway, CONTACTS cords and not ejected out Pharyngeal- Nectar Teaspoon -- Pharyngeal -- Pharyngeal- Nectar Cup Pharyngeal residue - pyriform;Pharyngeal residue - valleculae;Penetration/Aspiration during swallow Pharyngeal Material enters airway, remains ABOVE vocal cords then ejected out Pharyngeal- Nectar Straw -- Pharyngeal -- Pharyngeal- Thin Teaspoon -- Pharyngeal -- Pharyngeal- Thin Cup Pharyngeal residue - pyriform;Pharyngeal residue - valleculae Pharyngeal -- Pharyngeal- Thin Straw -- Pharyngeal -- Pharyngeal- Puree Pharyngeal residue - valleculae;Pharyngeal residue - pyriform Pharyngeal -- Pharyngeal- Mechanical Soft -- Pharyngeal -- Pharyngeal- Regular -- Pharyngeal -- Pharyngeal- Multi-consistency -- Pharyngeal -- Pharyngeal- Pill -- Pharyngeal -- Pharyngeal Comment --  CHL IP CERVICAL ESOPHAGEAL PHASE 01/17/2017 Cervical Esophageal Phase Impaired Pudding Teaspoon -- Pudding Cup -- Honey Teaspoon -- Honey Cup -- Nectar Teaspoon -- Nectar Cup -- Nectar Straw -- Thin Teaspoon -- Thin Cup -- Thin Straw -- Puree -- Mechanical Soft -- Regular -- Multi-consistency -- Pill -- Cervical Esophageal Comment -- No flowsheet data found. Houston Siren 01/17/2017, 3:24 PM Orbie Pyo Colvin Caroli.Ed CCC-SLP Pager [phone]                 No results found for: HGBA1C Lab Results  Component Value Date   LDLCALC 108 (H) 01/03/2014   CREATININE 0.47 (L) 01/17/2017       Scheduled Meds: . aspirin  81 mg Oral Daily  . atorvastatin  10 mg Oral Daily  . chlorhexidine  15 mL Mouth Rinse BID  . cloNIDine  0.1 mg Oral TID  . enoxaparin (LOVENOX) injection  30 mg Subcutaneous Q24H  . guaiFENesin  15 mL Oral Q4H while awake  . ipratropium-albuterol  3 mL Nebulization Q6H  . mouth rinse  15 mL Mouth Rinse q12n4p  . methylPREDNISolone (SOLU-MEDROL) injection  40 mg Intravenous Q12H  . mirtazapine  30 mg Oral QHS  . sertraline  100 mg  Oral Daily   Continuous Infusions: . cefTRIAXone (ROCEPHIN)  IV Stopped (01/17/17 1542)  . 0.9 % sodium chloride with kcl 75 mL/hr at 01/17/17 1700     LOS: 3 days    Time spent: >30 MINS  Reyne Dumas  [facility] Pager [phone]. If 7PM-7AM, please contact night-coverage at [email or web address], password [facility] 01/18/2017, 8:45 AM  LOS: 3 days

## 2017-01-18 NOTE — Progress Notes (Signed)
PT Cancellation Note / Screen  Patient Details Name: [name] MRN: [id number] DOB: Dec 20, 1950   Cancelled Treatment:    Reason Eval/Treat Not Completed: PT screened, no needs identified, will sign off  Pt bedbound and requires assist for ADLs at home per chart review.  RN reports pt with contractures and not assisting in care. Pt active with hospice services.  Pt does not appear appropriate for skilled PT at this time.  PT to sign off.  Osamu Olguin,KATHrine E 01/18/2017, 11:09 AM Carmelia Bake, PT, DPT 01/18/2017 Pager: [phone]

## 2017-01-18 NOTE — Consult Note (Signed)
WOC consulted for Stage 1 pressure injury to the buttocks, discussed with bedside nurse, per skin care order set they will manage with turning, repositioning, barrier cream to protect from incontinence and good skin care. No other topical care need, may use foam to protect if needed per skin care order set.   Discussed POC with bedside nurse.  Re consult if needed, will not follow at this time. Thanks  Cele Mote [employer], RN,CWOCN, CNS, [place] [phone])

## 2017-01-18 NOTE — Progress Notes (Addendum)
[facility]- [facility] [place] RN note  This is a related and covered admission from 01/15/17 per MD J. Feldmann. Admission to [facility] on 11/22/16 for Cerebrovascular disease. Patient was admitted after presentation to the ED with SOB, worsening dyspnea, and worsening productive cough x1 week with light green colored sputum. Patient is a FULL CODE. EMS called prior to calling Hospice. EMS notes SPO2 in 70's and it increased to 80's on NRB. ED evaluation noted patient in distress, tachypneic, and with use of accessory muscles.  Visited with patient in room this morning. Currently, there are no visitors. Patient is sleeping and does not appear to be in any distress. O2 HFNC at 4L.   Continuous Medications:  NS with 60 mEq potassium chloride @ 6ml/hr, Rocephin 1 Gm IV Q 24 hrs.   Hospice will continue to follow and anticiapte any discharge needs.  Please call with any hospice related questions.  Thank you,  Farrel Gordon, RN, [place] [facility] Liaison [phone]  [place] are on [place].

## 2017-01-18 NOTE — Progress Notes (Signed)
Home Care SW made visit to hospital.  Patient was awake and smiled at this SW upon arrival.  [place] spoke to [place] and will send her information to contact Patient's Wife and his Daughter so she can begin discussions regarding discharge plans.  She asked about LTC placement and SW talked to her about concerns with placement vs going home.  SW will continue to follow.  Cecilio Asper, [place] [facility] [place]

## 2017-01-18 NOTE — Progress Notes (Signed)
I checked in on [name] today.  No family present at bedside.  Discussed with Farrel Gordon from [facility].  Chart reviewed later in afternoon and reviewed note from hospice SW.  Will continue to follow, but it appears as though this is a disposition issue at this point.  Please let me know if there are specific areas in which our team can be of assistance.  Micheline Rough, MD [place] [facility] Team [phone]  NO CHARGE NOTE

## 2017-01-19 LAB — BASIC METABOLIC PANEL
ANION GAP: 7 (ref 5–15)
BUN: 12 mg/dL (ref 6–20)
CHLORIDE: 111 mmol/L (ref 101–111)
CO2: 24 mmol/L (ref 22–32)
Calcium: 8.1 mg/dL — ABNORMAL LOW (ref 8.9–10.3)
Creatinine, Ser: 0.44 mg/dL — ABNORMAL LOW (ref 0.61–1.24)
GFR calc non Af Amer: >60 mL/min (ref >60)
Glucose, Bld: 108 mg/dL — ABNORMAL HIGH (ref 65–99)
POTASSIUM: 4.3 mmol/L (ref 3.5–5.1)
SODIUM: 142 mmol/L (ref 135–145)

## 2017-01-19 MED ORDER — PREDNISONE 20 MG PO TABS
40.0000 mg | ORAL_TABLET | Freq: Every day | ORAL | Status: DC
  Administered 2017-01-20: 40 mg via ORAL
  Filled 2017-01-19: qty 2

## 2017-01-19 MED ORDER — IPRATROPIUM-ALBUTEROL 0.5-2.5 (3) MG/3ML IN SOLN
3.0000 mL | Freq: Three times a day (TID) | RESPIRATORY_TRACT | Status: DC
  Administered 2017-01-20: 3 mL via RESPIRATORY_TRACT
  Filled 2017-01-19: qty 3

## 2017-01-19 NOTE — Progress Notes (Signed)
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   patient has had SOB with a productive cough and when EMS was called they found the patient to have an SpO2 in the 60's-70's. Patient was brought in and placed on a NRB with improvement in saturations however then ended up on BiPAP. Per family patient has not been febrile and did not complain of any other concerns. Hospice Nurse evaluated  and patient was found to have bilateral rhonchi and rails and started on a tussive and Doxycycline but  He worsened  and was brought in for evaluation.  TR[facility] was called to admit for Acute Respiratory Failure with Hypoxia secondary to aspiration pneumonia. Patient placed on BiPAP and admitted to step down. Improved, transferred out of stepdown on 10/8. Currently patient on [place] oxygen, and appears comfortable.    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  Initially requiring BIPAP,  In step down.  Now weaned to [place] oxygen, keep sats greater than 90%. CT angio shows left lower lobe atelectasis due to endobronchial soft tissue densities. Pt was not a  candidate for bronchoscopy.  SLP evaluation recommended dysphagia 1 diet with nectar thick liquid.  Resume duo nebs as needed.  Transition IV steroids to po prednisone.  Resp panel is negative.  Urine strep antigen is positive.    Sepsis secondary to above:  - improved.    Lactic acid : improved.   Hypokalemia: replaced.    Hypertension:  Well controlled bp.    Hyperlipidemia:  Resume lipitor.    CVA:  Resume statin and aspirin.    Prolonged QTc: Repeat EKG in am.        DVT prophylaxis: lovenox.  Code Status: full code.  Family Communication: none at bedside.  Disposition Plan: home with hospice in am.    Consultants:   Palliative.    Procedures: none.    Antimicrobials:rocephin since admission.    Subjective: Denies any pain or sob.   Objective: Vitals:   01/19/17 0158 01/19/17 0529 01/19/17 0820 01/19/17 1420  BP:  120/65  (!) 142/77  Pulse:  65  67  Resp:  17  18  Temp:  98.6 F (37 C)  98.2 F (36.8 C)  TempSrc:  Axillary  Oral  SpO2: 100% 100% 96% 100%  Weight:      Height:        Intake/Output Summary (Last 24 hours) at 01/19/17 1609 Last data filed at 01/19/17 0900  Gross per 24 hour  Intake             1465 ml  Output  1650 ml  Net             -185 ml   Filed Weights   01/15/17 1300 01/15/17 1845  Weight: 52.2 kg (115 lb) 40 kg (88 lb 2.9 oz)    Examination:  General exam: Appears calm and comfortable  Respiratory system: Clear to auscultation. Respiratory effort normal. Cardiovascular system: S1 & S2 heard, RRR. No JVD, murmurs, rubs, gallops or clicks. No pedal edema. Gastrointestinal system: Abdomen is nondistended, soft and nontender. No organomegaly or masses felt. Normal bowel sounds heard. Central nervous system:  Pt sleeping comfortably,on waking up able to answer simple questions.  Extremities: contracted.  Skin: No rashes, lesions or ulcers Psychiatry:  Mood & affect appropriate.     Data  Reviewed: I have personally reviewed following labs and imaging studies  CBC:  Recent Labs Lab 01/15/17 1319 01/16/17 0337 01/17/17 0326 01/18/17 1346  WBC 14.2* 24.6* 26.2* 11.5*  NEUTROABS 12.2*  --   --   --   HGB 13.8 12.0* 12.0* 9.7*  HCT 42.6 36.4* 36.9* 29.9*  MCV 86.9 85.6 85.4 86.7  PLT 359 226 234 956   Basic Metabolic Panel:  Recent Labs Lab 01/15/17 1319 01/16/17 0337 01/16/17 1150 01/17/17 0326 01/18/17 1346 01/19/17 1033  NA 146* 144  --  144 148* 142  K 3.3* 3.3*  --  4.0 5.8* 4.3  CL 109 113*  --  111 124* 111  CO2 24 19*  --  18* 18* 24  GLUCOSE 166* 112*  --  74 197* 108*  BUN 19 13  --  12 16 12   CREATININE 0.62 0.56*  --  0.47* 0.57* 0.44*  CALCIUM 8.4* 7.5*  --  7.6* 7.9* 8.1*  MG  --   --  1.7  --  2.0  --    GFR: Estimated Creatinine Clearance: 52.1 mL/min (A) (by C-G formula based on SCr of 0.44 mg/dL (L)). Liver Function Tests:  Recent Labs Lab 01/15/17 1319 01/16/17 0337 01/17/17 0326 01/18/17 1346  AST 43* 27 47* 31  ALT 14* 11* 19 19  ALKPHOS 81 63 58 67  BILITOT 1.2 1.1 0.6 0.5  PROT 7.4 5.8* 5.7* 5.2*  ALBUMIN 2.8* 2.2* 2.0* 2.0*   No results for input(s): LIPASE, AMYLASE in the last 168 hours. No results for input(s): AMMONIA in the last 168 hours. Coagulation Profile:  Recent Labs Lab 01/15/17 1319  INR 1.01   Cardiac Enzymes: No results for input(s): CKTOTAL, CKMB, CKMBINDEX, TROPONINI in the last 168 hours. BNP (last 3 results) No results for input(s): PROBNP in the last 8760 hours. HbA1C: No results for input(s): HGBA1C in the last 72 hours. CBG: No results for input(s): GLUCAP in the last 168 hours. Lipid Profile: No results for input(s): CHOL, HDL, LDLCALC, TRIG, CHOLHDL, LDLDIRECT in the last 72 hours. Thyroid Function Tests: No results for input(s): TSH, T4TOTAL, FREET4, T3FREE, THYROIDAB in the last 72 hours. Anemia Panel: No results for input(s): VITAMINB12, FOLATE, FERRITIN, TIBC, IRON, RETICCTPCT in  the last 72 hours. Sepsis Labs:  Recent Labs Lab 01/15/17 1325 01/15/17 1617 01/15/17 1627 01/15/17 1739 01/15/17 1949 01/16/17 0337 01/17/17 0622  PROCALCITON  --   --  0.12  --   --  3.58 3.50  LATICACIDVEN 3.69* 4.07*  --  3.0* 3.9*  --   --     Recent Results (from the past 240 hour(s))  Urine culture     Status: None   Collection Time: 01/15/17  1:02  PM  Result Value Ref Range Status   Specimen Description URINE, RANDOM  Final   Special Requests NONE  Final   Culture   Final    NO GROWTH Performed at [facility], [address] [place], [place] [address]    Report Status 01/17/2017 FINAL  Final  Culture, blood (Routine x 2)     Status: None (Preliminary result)   Collection Time: 01/15/17  1:19 PM  Result Value Ref Range Status   Specimen Description BLOOD BLOOD LEFT FOREARM  Final   Special Requests   Final    BOTTLES DRAWN AEROBIC AND ANAEROBIC Blood Culture adequate volume   Culture   Final    NO GROWTH 4 DAYS Performed at [facility], [place] [address] [place], [place] [address]    Report Status PENDING  Incomplete  Culture, blood (Routine x 2)     Status: None (Preliminary result)   Collection Time: 01/15/17  1:43 PM  Result Value Ref Range Status   Specimen Description BLOOD LEFT ANTECUBITAL  Final   Special Requests   Final    BOTTLES DRAWN AEROBIC AND ANAEROBIC Blood Culture results may not be optimal due to an excessive volume of blood received in culture bottles   Culture   Final    NO GROWTH 4 DAYS Performed at [facility], [place] [address] [place], [place] [address]    Report Status PENDING  Incomplete  Respiratory Panel by PCR     Status: None   Collection Time: 01/15/17  5:05 PM  Result Value Ref Range Status   Adenovirus NOT DETECTED NOT DETECTED Final   Coronavirus 229E NOT DETECTED NOT DETECTED Final   Coronavirus HKU1 NOT DETECTED NOT DETECTED Final   Coronavirus NL63 NOT DETECTED NOT DETECTED Final   Coronavirus OC43  NOT DETECTED NOT DETECTED Final   Metapneumovirus NOT DETECTED NOT DETECTED Final   Rhinovirus / Enterovirus NOT DETECTED NOT DETECTED Final   Influenza A NOT DETECTED NOT DETECTED Final   Influenza A H1 NOT DETECTED NOT DETECTED Final   Influenza A H1 2009 NOT DETECTED NOT DETECTED Final   Influenza A H3 NOT DETECTED NOT DETECTED Final   Influenza B NOT DETECTED NOT DETECTED Final   Parainfluenza Virus 1 NOT DETECTED NOT DETECTED Final   Parainfluenza Virus 2 NOT DETECTED NOT DETECTED Final   Parainfluenza Virus 3 NOT DETECTED NOT DETECTED Final   Parainfluenza Virus 4 NOT DETECTED NOT DETECTED Final   Respiratory Syncytial Virus NOT DETECTED NOT DETECTED Final   Bordetella pertussis NOT DETECTED NOT DETECTED Final   Chlamydophila pneumoniae NOT DETECTED NOT DETECTED Final   Mycoplasma pneumoniae NOT DETECTED NOT DETECTED Final  MRSA PCR Screening     Status: None   Collection Time: 01/15/17  6:45 PM  Result Value Ref Range Status   MRSA by PCR NEGATIVE NEGATIVE Final    Comment:        The GeneXpert MRSA Assay (FDA approved for NASAL specimens only), is one component of a comprehensive MRSA colonization surveillance program. It is not intended to diagnose MRSA infection nor to guide or monitor treatment for MRSA infections.          Radiology Studies: No results found.      Scheduled Meds: . aspirin  81 mg Oral Daily  . atorvastatin  10 mg Oral Daily  . chlorhexidine  15 mL Mouth Rinse BID  . cloNIDine  0.1 mg Oral TID  . enoxaparin (  LOVENOX) injection  30 mg Subcutaneous Q24H  . guaiFENesin  15 mL Oral Q4H while awake  . ipratropium-albuterol  3 mL Nebulization Q6H  . mouth rinse  15 mL Mouth Rinse q12n4p  . mirtazapine  30 mg Oral QHS  . [START ON 01/20/2017] predniSONE  40 mg Oral Q breakfast  . sertraline  100 mg Oral Daily   Continuous Infusions: . cefTRIAXone (ROCEPHIN)  IV Stopped (01/19/17 1510)     LOS: 4 days    Time spent: 40 minutes.      Hosie Poisson, MD [facility] Pager [phone] If 7PM-7AM, please contact night-coverage [email or web address] Password [email or web address] 01/19/2017, 4:09 PM

## 2017-01-19 NOTE — Progress Notes (Signed)
[facility] 1417- [facility] [place] RN note  This is a related and covered admission from 01/15/17 per MD J. Feldmann. Admission to [facility] on 11/22/16 for Cerebrovascular disease. Patient was admitted after presentation to the ED with SOB, worsening dyspnea, and worsening productive cough x1 week with light green colored sputum. Patient is a FULL CODE. EMS called prior to calling Hospice.EMS notes SPO2 in 70's and it increased to 80's on NRB. ED evaluation noted patient in distress, tachypneic, and with use of accessory muscles.  Visited with patient in room this morning. No family or visitors present.  Patient is sleeping and does not appear to be in any distress. Spoke with RN, there are no updates or changes. She stated family visited for a short time the day before, there have been not visitors this morning.     Continuous Medications:  NS with 60 mEq potassium chloride @ 55ml/hr, Rocephin 1 Gm IV Q 24 hrs.   Hospice will continue to follow and anticiapte any discharge needs.  Please call with any hospice related questions.  Thank you,  Farrel Gordon, RN, [place] [facility] Liaison [phone]  [place] are on [place].

## 2017-01-19 NOTE — Progress Notes (Signed)
CSW acknowledged consult for SNF placement. Per chart review, patient's disposition plan is to return home with hospice services. CSW signing off, please consult if new needs arise.   Abundio Miu, [place] Social Worker [facility] Cell#: [phone]

## 2017-01-19 NOTE — Progress Notes (Signed)
  Speech Language Pathology Treatment: Dysphagia  Patient Details Name: [name] MRN: [id number] DOB: 1950-12-10 Today's Date: 01/19/2017 Time: 0998-3382 SLP Time Calculation (min) (ACUTE ONLY): 17 min  Assessment / Plan / Recommendation Clinical Impression  Pt seen to assess po tolerance - daughter [name] present and partially consumed meal tray at bedside.  Daughter reports pt began coughing at end of V8 Juice therefore she ceased providing po intake.  Showed MBS to daughter and pt with explanation of clinical reasoning for compensation strategies.   Oral control compromised causing nectar and puree to be more manageable to swallow than thin and solids requiring mastication.  Per RN who had pt yesterday, he consumed all of his meals with good tolerance.  SLP Inquired to daughter re: premorbid swallow function which daughter states he would cough when she provided him with noodles.    Note patient on hospice but is a full code. Suspect dysphagia likely due to CVA and ? Postpolio complications also.  Using teach back, educated daughter and patient to recommendations.  Sign translated to [place] for family/daughter.  Will follow up for dysphagia management.      HPI: [name] a [place] 66 y.o.malewith medical history significant of HTN, HLD, childhood Polio, post Polio Syndrome and CVA (Right Basal Ganglia and Thalamic Bleeding), bed bound with contractures who presented with worsening dyspnea and cough for a week. Pt found to have acute respiratory failure with hypoxia, community acquired PNA, sepsis d/t pna. CXR 10/7 left lower lobe collapse and patchy areas of left upper lobe infiltrate. Clear right lung. BSE recommended MBS.      SLP Plan          Recommendations  Diet recommendations: Dysphagia 1 (puree);Nectar-thick liquid Liquids provided via: Cup;Teaspoon;No straw Medication Administration: Crushed with puree Supervision: Full supervision/cueing for compensatory  strategies Compensations: Slow rate;Small sips/bites;Other (Comment) (intermittent double swallow) Postural Changes and/or Swallow Maneuvers: Seated upright 90 degrees;Upright 30-60 min after meal                Oral Care Recommendations: Oral care QID Follow up Recommendations: Other (comment) (TBD) SLP Visit Diagnosis: Dysphagia, oropharyngeal phase (R13.12)       GO              [name], MS [facility] SLP [phone]   Macario Golds 01/19/2017, 3:04 PM

## 2017-01-19 NOTE — Care Management Note (Signed)
Case Management Note  Patient Details  Name: [name] MRN: [id number] Date of Birth: November 01, 1950  Subjective/Objective:Patient unable to answer questions-bedbound,CVA.Attempted to contact spouse for conference w/attending for clarification of treatment, & disposition.Spouse-[name] tel#[phone]-BTDH vm w/call back#.Dtr of patient [name](c#[phone]) in rm-states she doesn't live w/patient & defers to contact spouse. She also states the home tel# is non functioning tel#[phone]-I have tried calling-unable to even leave message-non functioning tel#. Per [name]-The son [name] is a minor in HS. Explained to dtr that we need an available caregiver to respond when we need to communicate the plan of care-she voiced understanding. [facility] liason [name] Robertson-informed her of possible d/c in am per attending-to return back to home w/[facility] services. On 02, f/c-acute urinary retention. [facility] will manage [facility] services @ home. [facility] [phone] will transport back to home.              Action/Plan:d/c plan home w/hospice   Expected Discharge Date:  01/19/17               Expected Discharge Plan:  Home w Hospice Care  In-House Referral:     Discharge planning Services  CM Consult  Post Acute Care Choice:  Hospice (Active w/[facility]) Choice offered to:     DME Arranged:    DME Agency:     HH Arranged:    HH Agency:     Status of Service:  In process, will continue to follow  If discussed at Long Length of Stay Meetings, dates discussed:    Additional Comments:  Dessa Phi, RN 01/19/2017, 11:38 AM

## 2017-01-19 NOTE — Progress Notes (Signed)
OT Cancellation Note  Patient Details Name: [name] MRN: [id number] DOB: 1950/09/03   Cancelled Treatment:    Reason Eval/Treat Not Completed: Other (comment)   Pt bedbound and requires assist for ADLs at home per chart review.  RN reports pt with contractures and not assisting in care. Pt active with hospice services.  Pt does not appear appropriate for skilled OT at this time [name], [name], [place] [phone] 01/19/2017, 9:06 AM

## 2017-01-20 DIAGNOSIS — J9601 Acute respiratory failure with hypoxia: Secondary | ICD-10-CM

## 2017-01-20 LAB — CULTURE, BLOOD (ROUTINE X 2)
CULTURE: NO GROWTH
CULTURE: NO GROWTH
SPECIAL REQUESTS: ADEQUATE

## 2017-01-20 MED ORDER — GUAIFENESIN 100 MG/5ML PO SOLN: 15.0000 mL | ORAL | 0 refills | Status: DC

## 2017-01-20 MED ORDER — IPRATROPIUM-ALBUTEROL 0.5-2.5 (3) MG/3ML IN SOLN: 3.0000 mL | Freq: Three times a day (TID) | RESPIRATORY_TRACT | 1 refills | Status: DC

## 2017-01-20 MED ORDER — RESOURCE THICKENUP CLEAR PO POWD: ORAL | 10 refills | Status: DC

## 2017-01-20 MED ORDER — AMOXICILLIN-POT CLAVULANATE 875-125 MG PO TABS: 1.0000 | ORAL_TABLET | Freq: Two times a day (BID) | ORAL | 0 refills | Status: AC

## 2017-01-20 MED ORDER — PREDNISONE 20 MG PO TABS: ORAL_TABLET | ORAL | 0 refills | Status: DC

## 2017-01-20 NOTE — Care Management Note (Signed)
Case Management Note  Patient Details  Name: [name] MRN: [id number] Date of Birth: 03-31-1951  Subjective/Objective:  TC spouse [name] c#[phone] of patient d/c today home w/hospice([facility])-[facility] transport pick up from [employer] @ 12p going to home(confirmed address) spouse says son [name] will be home to receive patient. [facility] called for 12p pick up spoke to [name](Captain)-informed of 02,f/c,vietanmese,bed bound,contractures. Nurse aware of [facility](forms in shadow chart) pick up @ 12p.[facility] rep [name] aware of d/c & time. No further CM needs.                  Action/Plan:d/c home w/[facility].   Expected Discharge Date:  01/20/17               Expected Discharge Plan:  Home w Hospice Care  In-House Referral:     Discharge planning Services  CM Consult  Post Acute Care Choice:  Hospice (Active w/[facility]) Choice offered to:     DME Arranged:    DME Agency:     HH Arranged:    HH Agency:  [facility]  Status of Service:  Completed, signed off  If discussed at [place] of Stay Meetings, dates discussed:    Additional Comments:  Dessa Phi, RN 01/20/2017, 11:09 AM

## 2017-01-20 NOTE — Progress Notes (Signed)
Discussed with [name] from [facility].  I have attempted to reach out to family for any questions or concerns, but have been unable to reach despite multiple visits to room and calls.  Plan is to return home with hospice.  Noted concern about how long this will remain a feasible care plan.  Please let me know if there are specific areas with which we may be of assistance.  Micheline Rough, MD [facility] [phone]  NO CHARGE NOTE

## 2017-01-20 NOTE — Care Management Note (Signed)
Case Management Note  Patient Details  Name: [name] MRN: [id number] Date of Birth: 02/09/1951  Subjective/Objective: Full code. D/c home w/[facility] to continue services. [facility] forms on shadow chart-Will call @11a  for 12p pick up. No further CM needs.                   Action/Plan:d/c home w/home hospice services.   Expected Discharge Date:  01/20/17               Expected Discharge Plan:  Home w Hospice Care  In-House Referral:     Discharge planning Services  CM Consult  Post Acute Care Choice:  Hospice (Active w/[facility]) Choice offered to:     DME Arranged:    DME Agency:     HH Arranged:    HH Agency:  [facility]  Status of Service:  Completed, signed off  If discussed at [place] of Stay Meetings, dates discussed:    Additional Comments:  Dessa Phi, RN 01/20/2017, 10:58 AM

## 2017-01-20 NOTE — Progress Notes (Signed)
[facility]- [facility] RN note @ 0930  This is a related and covered admission from 01/15/17 per MD J. Feldmann. Admission to [facility] on 11/22/16 for Cerebrovascular disease. Patient was admitted after presentation to the ED with SOB, worsening dyspnea, and worsening productive cough x1 week with light green colored sputum. Patient is a FULL CODE. EMS called prior to calling Hospice.EMS notes SPO2 in 70's and it increased to 80's on NRB. ED evaluation noted patient in distress, tachypneic, and with use of accessory muscles.  Visited with patient in room this morning. No family or visitors present.  Patient is sleeping and does not appear to be in any distress.   Per Dessa Phi, RNCM patient maybe discharged home today.   Continuous Medications:  Rocephin 1 Gm IV Q 24 hrs.   Hospice will continue to follow and anticiapte any discharge needs.  Please call with any hospice related questions.  Thank you,  Farrel Gordon, RN, [facility] Liaison [phone]  [place] are on [place].

## 2017-01-20 NOTE — Discharge Summary (Signed)
Physician Discharge Summary  [name] YPP:[id number] DOB: 1951-02-07 DOA: 01/15/2017  PCP: Josetta Huddle, MD  Admit date: 01/15/2017 Discharge date: 01/20/2017  Admitted From: Home.  Disposition: Home.   Recommendations for Outpatient Follow-up:  1. Follow up with PCP in 1-2 weeks 2. Please obtain BMP/CBC in one week 3. Please follow up with hospice MD as needed.    Discharge Condition: hospice.  CODE STATUS: full code.  Diet recommendation: dysphagia diet.   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 patient has had SOB with a productive cough and when EMS was called they found the patient to have an SpO2 in the 60's-70's. Patient was brought in and placed on a NRB with improvement in saturations however then ended up on BiPAP. Per family patient has not been febrile and did not complain of any other concerns. Hospice Nurse evaluated  and patient was found to have bilateral rhonchi and rails and started on a tussive and Doxycycline but  He worsened and was brought in for evaluation. [facility] was called to admit for Acute Respiratory Failure with Hypoxia secondary to aspiration pneumonia. Patient placed on BiPAP and admitted to step down. Improved, transferred out of stepdown on 10/8. Currently patient on [place] oxygen, and appears comfortable.   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  Initially requiring BIPAP,   was in step down, weaned to [place] oxygen, keep sats greater than 90%. CT angio shows left lower lobe atelectasis due to endobronchial soft tissue densities. Pt was not a candidate for bronchoscopy.  SLP evaluation recommended dysphagia 1 diet with nectar thick liquid.  Resume duo nebs as needed.  Transitioned IV steroids to po prednisone.  Resp panel is negative.  Urine strep antigen is positive.  Completed a short course of antibiotic.   Sepsis secondary to above:  - improved.    Lactic acid : improved.   Hypokalemia: replaced.    Hypertension:  Well controlled bp.    Hyperlipidemia:  Resume lipitor.    CVA:  Resume statin and aspirin.     Discharge Instructions  Discharge Instructions    Diet - low sodium heart healthy    Complete by:  As directed    Discharge instructions    Complete by:  As directed    Follow up with hospice MD as needed.     Allergies as of 01/20/2017   No Known Allergies     Medication List    STOP taking these medications   HYDROcodone-acetaminophen 5-325 MG tablet Commonly known as:  NORCO/VICODIN   MUCINEX 600 MG 12 hr tablet Generic drug:  guaiFENesin Replaced by:  guaiFENesin 100 MG/5ML Soln   TUSSIN 100 MG/5ML syrup Generic drug:  guaifenesin     TAKE these medications   amLODipine  10 MG tablet Commonly known as:  NORVASC Take 1 tablet (10 mg total) by mouth daily.   aspirin EC 81 MG tablet Take 1 tablet (81 mg total) by mouth daily.   atorvastatin 10 MG tablet Commonly known as:  LIPITOR Take 10 mg by mouth daily.   cloNIDine 0.1 MG tablet Commonly known as:  CATAPRES Take 1 tablet (0.1 mg total) by mouth 3 (three) times daily.   guaiFENesin 100 MG/5ML Soln Commonly known as:  ROBITUSSIN Take 15  mLs (300 mg total) by mouth every 4 (four) hours while awake. Replaces:  MUCINEX 600 MG 12 hr tablet   ipratropium-albuterol 0.5-2.5 (3) MG/3ML Soln Commonly known as:  DUONEB Take 3 mLs by nebulization 3 (three) times daily.   losartan 100 MG tablet Commonly known as:  COZAAR Take 0.5 tablets (50 mg total) by mouth daily.   mirtazapine 30 MG tablet Commonly known as:  REMERON Take 30 mg by mouth at bedtime.   naproxen 500 MG tablet Commonly known as:  NAPROSYN Take 500 mg by mouth 2 (two) times daily with a meal.   NEXIUM 40 MG capsule Generic drug:  esomeprazole Take 40 mg by mouth daily.   predniSONE 20 MG tablet Commonly known as:  DELTASONE Prednisone 40 mg daily for another day followed by  Prednisone 20 mg daily for 3 days   RESOURCE THICKENUP CLEAR Powd Use with every meal.   sertraline 100 MG tablet Commonly known as:  ZOLOFT Take 100 mg by mouth daily.            Durable Medical Equipment        Start     Ordered   01/18/17 [phone]  For home use only DME oxygen  Once    Question Answer Comment  Mode or (Route) Nasal cannula   Liters per Minute 4   Frequency Continuous (stationary and portable oxygen unit needed)   Oxygen conserving device Yes   Oxygen delivery system Gas      01/18/17 0843     Follow-up Information    [facility] Follow up.   Specialty:  Hospice and Palliative Medicine Why:  Home nurse visit Contact information: [place] [address] [phone]        Josetta Huddle, MD. Schedule an appointment as soon as possible for a visit in 1 week(s).   Specialty:  Internal Medicine Contact information: [address] [place] [address] [place] [address] [phone]          No Known Allergies  Consultations:  Palliative care.    Procedures/Studies: Ct Angio Chest Pe W And/or Wo Contrast  Result Date: 01/15/2017 CLINICAL DATA:  Malaise times several days with dyspnea productive cough.  Rhonchi. EXAM: CT ANGIOGRAPHY CHEST WITH CONTRAST TECHNIQUE: Multidetector CT imaging of the chest was performed using the standard protocol during bolus administration of intravenous contrast. Multiplanar CT image reconstructions and MIPs were obtained to evaluate the vascular anatomy. CONTRAST:  70 cc Isovue 370 IV COMPARISON:  CXR 01/15/2017 and 12/17/2013 FINDINGS: Cardiovascular: Heart size is normal. There is no pericardial effusion. No acute pulmonary embolus to the segmental level. The aorta is normal in caliber with minimal aortic atherosclerosis. No dissection. Mediastinum/Nodes: Small precarinal and right hilar subcentimeter short axis lymph nodes are noted. No axillary or supraclavicular lymphadenopathy. The trachea and mainstem bronchi are patent however there are finger-like soft tissue densities within the lobar, segmental and subsegmental branches to the left lower lobe. Lungs/Pleura: Left lower lobe  atelectasis due to soft tissue densities within lobar, segmental and subsegmental branches to the left lower lobe. Peribronchial thickening is noted to both lower lobes and lingula. Nonspecific scattered nodular infiltrates are identified to the left upper lobe and lingula. Upper Abdomen: The liver is hypodense in appearance consistent with hepatic steatosis. No acute upper abdominal abnormality. Musculoskeletal: The bones are demineralized in appearance. No acute nor suspicious osseous abnormalities. Review of the MIP images confirms the above findings. IMPRESSION: 1. Left lower lobe atelectasis due to endobronchial soft tissue densities noted of left lower lobe bronchi with differential possibilities more commonly would include mucous plugging though endobronchial lesions are not entirely excluded. 2. Scattered nodular infiltrates in the left upper lobe and lingula, nonspecific possibly related to community-acquired pneumonia such as mycoplasma, fungal infection, or granulomatous disease potentially.  Followup PA and lateral chest X-ray is recommended in 3-4 weeks following trial of antibiotic therapy to ensure resolution and exclude underlying malignancy. 3. No acute pulmonary embolus. Aortic Atherosclerosis (ICD10-I70.0). Electronically Signed   By: Ashley Royalty M.D.   On: 01/15/2017 16:02   Dg Chest Port 1 View  Result Date: 01/16/2017 CLINICAL DATA:  Shortness of breath.  Follow-up exam. EXAM: PORTABLE CHEST 1 VIEW COMPARISON:  01/15/2017 FINDINGS: Left lower lobe collapse and patchy areas of left upper lobe opacity is similar to the previous day's study. Right lung is clear. No pleural effusion.  No pneumothorax. Heart is normal in size. IMPRESSION: 1. No significant change from the previous day's chest radiograph or chest CT. 2. Left lower lobe collapse and patchy areas of left upper lobe infiltrate. Clear right lung. Electronically Signed   By: Lajean Manes M.D.   On: 01/16/2017 08:52   Dg Chest Portable 1 View  Result Date: 01/15/2017 CLINICAL DATA:  Feeling unwell for the past few days with SOB. Has also had productive cough. EMS noted rhonchi throughout. SPO2 in 70s with EMS. Increased to 80s on NRB. EXAM: PORTABLE CHEST 1 VIEW COMPARISON:  12/17/2013 FINDINGS: There is mild left basilar atelectasis. There is no focal consolidation. There is no pleural effusion or pneumothorax. The heart and mediastinal contours are unremarkable. The osseous structures are unremarkable. IMPRESSION: No active cardiopulmonary disease. If there is further clinical concern, recommend a two-view chest. Electronically Signed   By: Kathreen Devoid   On: 01/15/2017 13:45   Dg Swallowing Func-speech Pathology  Result Date: 01/17/2017 Objective Swallowing Evaluation: Type of Study: MBS-Modified Barium Swallow Study Patient Details Name: [name] MRN: [id number] Date of Birth: 1951-01-12 Today's Date: 01/17/2017 Time: SLP Start Time (ACUTE ONLY): 1215-SLP Stop Time (ACUTE ONLY): 1240 SLP Time Calculation (min) (ACUTE  ONLY): 25 min Past Medical History: Past Medical History: Diagnosis Date . Hypertension  . Polio  . Stroke [facility])  Past Surgical History: Past Surgical History: Procedure Laterality Date . 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 Pt found to have acute respiratory failure with hypoxia, community acquired PNA, sepsis d/t pna. CXR 10/7 left lower lobe collapse and patchy areas of left upper lobe infiltrate. Clear right lung. BSE recommended MBS. Subjective: The patient was seen sitting upright in bed.  Assessment / Plan / Recommendation CHL IP CLINICAL IMPRESSIONS 01/17/2017 Clinical Impression   Moderate oral impairments included decreased cohesion and control, delayed posterior propulsion and  inconsistent piecemeal transit pattern. He exhibited a mild-moderate motor based pharyngeal dysphagia resulting in vallecular and pyriform residue with decreased effectiveness of multiple swallows at end of study due to possible fatigue. Laryngeal penetration to vocal cords with initial trial of MBS with honey which pt held in oral cavity for significant amount of time. When swallow initiated, fluoroscopy was turned off therefore uncertain if penetration to vocal cords occured prior to initiation or during the swallow. Flash penetration with nectar thick x 1. Mild residue in cervical esophagus possibly due to tight UES. Given pt's lower extremity contractures, unable to lower camera to view esophagus. Recommend Dys 1 (puree) texture, nectar thick liquids, second swallow, pills crushed, sit upright, no straws, full assist and supervision during meals and continued ST.    SLP Visit Diagnosis Dysphagia, oropharyngeal phase (R13.12) Attention and concentration deficit following -- Frontal  lobe and executive function deficit following -- Impact on safety and function (No Data)   CHL IP TREATMENT RECOMMENDATION 01/17/2017 Treatment Recommendations Therapy as outlined in treatment plan below   Prognosis 01/17/2017 Prognosis for Safe Diet Advancement Fair Barriers to Reach Goals -- Barriers/Prognosis Comment -- CHL IP DIET RECOMMENDATION 01/17/2017 SLP Diet Recommendations Dysphagia 1 (Puree) solids;Nectar thick liquid Liquid Administration via Cup;No straw Medication Administration Crushed with puree Compensations Slow rate;Small sips/bites;Minimize environmental distractions;Multiple dry swallows after each bite/sip Postural Changes Seated upright at 90 degrees   CHL IP OTHER RECOMMENDATIONS 01/17/2017 Recommended Consults -- Oral Care Recommendations Oral care QID Other Recommendations --   CHL IP FOLLOW UP RECOMMENDATIONS 01/17/2017 Follow up Recommendations (No Data)   CHL IP FREQUENCY AND DURATION 01/17/2017 Speech Therapy Frequency (ACUTE ONLY) min 2x/week Treatment Duration 2 weeks      CHL IP ORAL PHASE 01/17/2017 Oral Phase Impaired Oral - Pudding Teaspoon -- Oral - Pudding Cup -- Oral - Honey Teaspoon -- Oral - Honey Cup Decreased bolus cohesion;Delayed oral transit;Piecemeal swallowing;Reduced posterior propulsion Oral - Nectar Teaspoon -- Oral - Nectar Cup Decreased bolus cohesion;Delayed oral transit;Reduced posterior propulsion Oral - Nectar Straw -- Oral - Thin Teaspoon -- Oral - Thin Cup Decreased bolus cohesion;Delayed oral transit;Reduced posterior propulsion Oral - Thin Straw -- Oral - Puree Decreased bolus cohesion;Delayed oral transit;Reduced posterior propulsion Oral - Mech Soft -- Oral - Regular -- Oral - Multi-Consistency -- Oral - Pill -- Oral Phase - Comment --  CHL IP PHARYNGEAL PHASE 01/17/2017 Pharyngeal Phase Impaired Pharyngeal- Pudding Teaspoon -- Pharyngeal -- Pharyngeal- Pudding Cup -- Pharyngeal -- Pharyngeal- Honey Teaspoon -- Pharyngeal -- Pharyngeal- Honey Cup  Penetration/Aspiration during swallow;Pharyngeal residue - valleculae;Pharyngeal residue - pyriform;Reduced anterior laryngeal mobility;Reduced laryngeal elevation Pharyngeal Material enters airway, CONTACTS cords and not ejected out Pharyngeal- Nectar Teaspoon -- Pharyngeal -- Pharyngeal- Nectar Cup Pharyngeal residue - pyriform;Pharyngeal residue - valleculae;Penetration/Aspiration during swallow Pharyngeal Material enters airway, remains ABOVE vocal cords then ejected out Pharyngeal- Nectar Straw -- Pharyngeal -- Pharyngeal- Thin Teaspoon -- Pharyngeal -- Pharyngeal- Thin Cup Pharyngeal residue - pyriform;Pharyngeal residue - valleculae Pharyngeal -- Pharyngeal- Thin Straw -- Pharyngeal -- Pharyngeal- Puree Pharyngeal residue - valleculae;Pharyngeal residue - pyriform Pharyngeal -- Pharyngeal- Mechanical Soft -- Pharyngeal -- Pharyngeal- Regular -- Pharyngeal -- Pharyngeal- Multi-consistency -- Pharyngeal -- Pharyngeal- Pill -- Pharyngeal -- Pharyngeal Comment --  CHL IP CERVICAL ESOPHAGEAL PHASE 01/17/2017 Cervical Esophageal Phase Impaired Pudding Teaspoon -- Pudding Cup -- Honey Teaspoon -- Honey Cup -- Nectar Teaspoon -- Nectar Cup -- Nectar Straw -- Thin Teaspoon -- Thin Cup -- Thin Straw -- Puree -- Mechanical Soft -- Regular --  Multi-consistency -- Pill -- Cervical Esophageal Comment -- No flowsheet data found. [place] Siren 01/17/2017, 3:24 PM Orbie Pyo Colvin Caroli.Ed CCC-SLP Pager [phone]                   Subjective: Denies any new complaints.   Discharge Exam: Vitals:   01/20/17 0518 01/20/17 0834  BP: (!) 159/71   Pulse: (!) 58   Resp: 15   Temp: 97.6 F (36.4 C)   SpO2: 100% (!) 9%   Vitals:   01/19/17 1420 01/19/17 2105 01/20/17 0518 01/20/17 0834  BP: (!) 142/77 (!) 146/86 (!) 159/71   Pulse: 67 (!) 58 (!) 58   Resp: 18 16 15    Temp: 98.2 F (36.8 C) 98.8 F (37.1 C) 97.6 F (36.4 C)   TempSrc: Oral Oral Oral   SpO2: 100% 100% 100% (!) 9%  Weight:      Height:         General: Pt is alert, awake, not in acute distress Cardiovascular: RRR, S1/S2 +, no rubs, no gallops Respiratory: CTA bilaterally, no wheezing, no rhonchi Abdominal: Soft, NT, ND, bowel sounds + Extremities: contractures of the upper and lower extremities.     The results of significant diagnostics from this hospitalization (including imaging, microbiology, ancillary and laboratory) are listed below for reference.     Microbiology: Recent Results (from the past 240 hour(s))  Urine culture     Status: None   Collection Time: 01/15/17  1:02 PM  Result Value Ref Range Status   Specimen Description URINE, RANDOM  Final   Special Requests NONE  Final   Culture   Final    NO GROWTH Performed at [facility], [address] [place], [place] [address]    Report Status 01/17/2017 FINAL  Final  Culture, blood (Routine x 2)     Status: None (Preliminary result)   Collection Time: 01/15/17  1:19 PM  Result Value Ref Range Status   Specimen Description BLOOD BLOOD LEFT FOREARM  Final   Special Requests   Final    BOTTLES DRAWN AEROBIC AND ANAEROBIC Blood Culture adequate volume   Culture   Final    NO GROWTH 4 DAYS Performed at [facility], [place] [address] [place], [place] [address]    Report Status PENDING  Incomplete  Culture, blood (Routine x 2)     Status: None (Preliminary result)   Collection Time: 01/15/17  1:43 PM  Result Value Ref Range Status   Specimen Description BLOOD LEFT ANTECUBITAL  Final   Special Requests   Final    BOTTLES DRAWN AEROBIC AND ANAEROBIC Blood Culture results may not be optimal due to an excessive volume of blood received in culture bottles   Culture   Final    NO GROWTH 4 DAYS Performed at [facility], [place] [address], [place], [place] [address]    Report Status PENDING  Incomplete  Respiratory Panel by PCR     Status: None   Collection Time: 01/15/17  5:05 PM  Result Value Ref Range Status   Adenovirus NOT DETECTED NOT  DETECTED Final   Coronavirus 229E NOT DETECTED NOT DETECTED Final   Coronavirus HKU1 NOT DETECTED NOT DETECTED Final   Coronavirus NL63 NOT DETECTED NOT DETECTED Final   Coronavirus OC43 NOT DETECTED NOT DETECTED Final   Metapneumovirus NOT DETECTED NOT DETECTED Final   Rhinovirus / Enterovirus NOT DETECTED NOT DETECTED Final   Influenza A NOT DETECTED NOT  DETECTED Final   Influenza A H1 NOT DETECTED NOT DETECTED Final   Influenza A H1 2009 NOT DETECTED NOT DETECTED Final   Influenza A H3 NOT DETECTED NOT DETECTED Final   Influenza B NOT DETECTED NOT DETECTED Final   Parainfluenza Virus 1 NOT DETECTED NOT DETECTED Final   Parainfluenza Virus 2 NOT DETECTED NOT DETECTED Final   Parainfluenza Virus 3 NOT DETECTED NOT DETECTED Final   Parainfluenza Virus 4 NOT DETECTED NOT DETECTED Final   Respiratory Syncytial Virus NOT DETECTED NOT DETECTED Final   Bordetella pertussis NOT DETECTED NOT DETECTED Final   Chlamydophila pneumoniae NOT DETECTED NOT DETECTED Final   Mycoplasma pneumoniae NOT DETECTED NOT DETECTED Final  MRSA PCR Screening     Status: None   Collection Time: 01/15/17  6:45 PM  Result Value Ref Range Status   MRSA by PCR NEGATIVE NEGATIVE Final    Comment:        The GeneXpert MRSA Assay (FDA approved for NASAL specimens only), is one component of a comprehensive MRSA colonization surveillance program. It is not intended to diagnose MRSA infection nor to guide or monitor treatment for MRSA infections.      Labs: BNP (last 3 results)  Recent Labs  01/15/17 1319  BNP 34.7   Basic Metabolic Panel:  Recent Labs Lab 01/15/17 1319 01/16/17 0337 01/16/17 1150 01/17/17 0326 01/18/17 1346 01/19/17 1033  NA 146* 144  --  144 148* 142  K 3.3* 3.3*  --  4.0 5.8* 4.3  CL 109 113*  --  111 124* 111  CO2 24 19*  --  18* 18* 24  GLUCOSE 166* 112*  --  74 197* 108*  BUN 19 13  --  12 16 12   CREATININE 0.62 0.56*  --  0.47* 0.57* 0.44*  CALCIUM 8.4* 7.5*  --  7.6*  7.9* 8.1*  MG  --   --  1.7  --  2.0  --    Liver Function Tests:  Recent Labs Lab 01/15/17 1319 01/16/17 0337 01/17/17 0326 01/18/17 1346  AST 43* 27 47* 31  ALT 14* 11* 19 19  ALKPHOS 81 63 58 67  BILITOT 1.2 1.1 0.6 0.5  PROT 7.4 5.8* 5.7* 5.2*  ALBUMIN 2.8* 2.2* 2.0* 2.0*   No results for input(s): LIPASE, AMYLASE in the last 168 hours. No results for input(s): AMMONIA in the last 168 hours. CBC:  Recent Labs Lab 01/15/17 1319 01/16/17 0337 01/17/17 0326 01/18/17 1346  WBC 14.2* 24.6* 26.2* 11.5*  NEUTROABS 12.2*  --   --   --   HGB 13.8 12.0* 12.0* 9.7*  HCT 42.6 36.4* 36.9* 29.9*  MCV 86.9 85.6 85.4 86.7  PLT 359 226 234 253   Cardiac Enzymes: No results for input(s): CKTOTAL, CKMB, CKMBINDEX, TROPONINI in the last 168 hours. BNP: Invalid input(s): POCBNP CBG: No results for input(s): GLUCAP in the last 168 hours. D-Dimer No results for input(s): DDIMER in the last 72 hours. Hgb A1c No results for input(s): HGBA1C in the last 72 hours. Lipid Profile No results for input(s): CHOL, HDL, LDLCALC, TRIG, CHOLHDL, LDLDIRECT in the last 72 hours. Thyroid function studies No results for input(s): TSH, T4TOTAL, T3FREE, THYROIDAB in the last 72 hours.  Invalid input(s): FREET3 Anemia work up No results for input(s): VITAMINB12, FOLATE, FERRITIN, TIBC, IRON, RETICCTPCT in the last 72 hours. Urinalysis    Component Value Date/Time   COLORURINE YELLOW 01/15/2017 1302   APPEARANCEUR CLEAR 01/15/2017 1302   LABSPEC 1.014  01/15/2017 1302   PHURINE 7.0 01/15/2017 1302   GLUCOSEU NEGATIVE 01/15/2017 1302   HGBUR SMALL (A) 01/15/2017 1302   BILIRUBINUR NEGATIVE 01/15/2017 1302   KETONESUR NEGATIVE 01/15/2017 1302   PROTEINUR NEGATIVE 01/15/2017 1302   UROBILINOGEN 1.0 12/17/2013 1152   NITRITE NEGATIVE 01/15/2017 1302   LEUKOCYTESUR NEGATIVE 01/15/2017 1302   Sepsis Labs Invalid input(s): PROCALCITONIN,  WBC,  LACTICIDVEN Microbiology Recent Results (from  the past 240 hour(s))  Urine culture     Status: None   Collection Time: 01/15/17  1:02 PM  Result Value Ref Range Status   Specimen Description URINE, RANDOM  Final   Special Requests NONE  Final   Culture   Final    NO GROWTH Performed at [facility], [place] [address], [place], [place] [address]    Report Status 01/17/2017 FINAL  Final  Culture, blood (Routine x 2)     Status: None (Preliminary result)   Collection Time: 01/15/17  1:19 PM  Result Value Ref Range Status   Specimen Description BLOOD BLOOD LEFT FOREARM  Final   Special Requests   Final    BOTTLES DRAWN AEROBIC AND ANAEROBIC Blood Culture adequate volume   Culture   Final    NO GROWTH 4 DAYS Performed at [facility], [facility] [address] [place], [place] [address]    Report Status PENDING  Incomplete  Culture, blood (Routine x 2)     Status: None (Preliminary result)   Collection Time: 01/15/17  1:43 PM  Result Value Ref Range Status   Specimen Description BLOOD LEFT ANTECUBITAL  Final   Special Requests   Final    BOTTLES DRAWN AEROBIC AND ANAEROBIC Blood Culture results may not be optimal due to an excessive volume of blood received in culture bottles   Culture   Final    NO GROWTH 4 DAYS Performed at [facility], [place] [address] [place], [place] [address]    Report Status PENDING  Incomplete  Respiratory Panel by PCR     Status: None   Collection Time: 01/15/17  5:05 PM  Result Value Ref Range Status   Adenovirus NOT DETECTED NOT DETECTED Final   Coronavirus 229E NOT DETECTED NOT DETECTED Final   Coronavirus HKU1 NOT DETECTED NOT DETECTED Final   Coronavirus NL63 NOT DETECTED NOT DETECTED Final   Coronavirus OC43 NOT DETECTED NOT DETECTED Final   Metapneumovirus NOT DETECTED NOT DETECTED Final   Rhinovirus / Enterovirus NOT DETECTED NOT DETECTED Final   Influenza A NOT DETECTED NOT DETECTED Final   Influenza A H1 NOT DETECTED NOT DETECTED Final   Influenza A H1 2009 NOT DETECTED NOT  DETECTED Final   Influenza A H3 NOT DETECTED NOT DETECTED Final   Influenza B NOT DETECTED NOT DETECTED Final   Parainfluenza Virus 1 NOT DETECTED NOT DETECTED Final   Parainfluenza Virus 2 NOT DETECTED NOT DETECTED Final   Parainfluenza Virus 3 NOT DETECTED NOT DETECTED Final   Parainfluenza Virus 4 NOT DETECTED NOT DETECTED Final   Respiratory Syncytial Virus NOT DETECTED NOT DETECTED Final   Bordetella pertussis NOT DETECTED NOT DETECTED Final   Chlamydophila pneumoniae NOT DETECTED NOT DETECTED Final   Mycoplasma pneumoniae NOT DETECTED NOT DETECTED Final  MRSA PCR Screening     Status: None   Collection Time: 01/15/17  6:45 PM  Result Value Ref Range Status   MRSA by PCR NEGATIVE NEGATIVE Final    Comment:  The GeneXpert MRSA Assay (FDA approved for NASAL specimens only), is one component of a comprehensive MRSA colonization surveillance program. It is not intended to diagnose MRSA infection nor to guide or monitor treatment for MRSA infections.      Time coordinating discharge: Over 30 minutes  SIGNED:   Hosie Poisson, MD  [facility] 01/20/2017, 10:38 AM Pager   If 7PM-7AM, please contact night-coverage [email or web address] Password [email or web address]

## 2017-01-22 DIAGNOSIS — E46 Unspecified protein-calorie malnutrition: Secondary | ICD-10-CM | POA: Diagnosis not present

## 2017-01-22 DIAGNOSIS — E785 Hyperlipidemia, unspecified: Secondary | ICD-10-CM | POA: Diagnosis not present

## 2017-01-22 DIAGNOSIS — I679 Cerebrovascular disease, unspecified: Secondary | ICD-10-CM | POA: Diagnosis not present

## 2017-01-22 DIAGNOSIS — L89003 Pressure ulcer of unspecified elbow, stage 3: Secondary | ICD-10-CM | POA: Diagnosis not present

## 2017-01-22 DIAGNOSIS — I69959 Hemiplegia and hemiparesis following unspecified cerebrovascular disease affecting unspecified side: Secondary | ICD-10-CM | POA: Diagnosis not present

## 2017-01-22 DIAGNOSIS — I10 Essential (primary) hypertension: Secondary | ICD-10-CM | POA: Diagnosis not present

## 2017-01-25 DIAGNOSIS — I679 Cerebrovascular disease, unspecified: Secondary | ICD-10-CM | POA: Diagnosis not present

## 2017-01-25 DIAGNOSIS — E785 Hyperlipidemia, unspecified: Secondary | ICD-10-CM | POA: Diagnosis not present

## 2017-01-25 DIAGNOSIS — I10 Essential (primary) hypertension: Secondary | ICD-10-CM | POA: Diagnosis not present

## 2017-01-25 DIAGNOSIS — E46 Unspecified protein-calorie malnutrition: Secondary | ICD-10-CM | POA: Diagnosis not present

## 2017-01-25 DIAGNOSIS — L89003 Pressure ulcer of unspecified elbow, stage 3: Secondary | ICD-10-CM | POA: Diagnosis not present

## 2017-01-25 DIAGNOSIS — I69959 Hemiplegia and hemiparesis following unspecified cerebrovascular disease affecting unspecified side: Secondary | ICD-10-CM | POA: Diagnosis not present

## 2017-01-26 DIAGNOSIS — I10 Essential (primary) hypertension: Secondary | ICD-10-CM | POA: Diagnosis not present

## 2017-01-26 DIAGNOSIS — E785 Hyperlipidemia, unspecified: Secondary | ICD-10-CM | POA: Diagnosis not present

## 2017-01-26 DIAGNOSIS — I69959 Hemiplegia and hemiparesis following unspecified cerebrovascular disease affecting unspecified side: Secondary | ICD-10-CM | POA: Diagnosis not present

## 2017-01-26 DIAGNOSIS — L89003 Pressure ulcer of unspecified elbow, stage 3: Secondary | ICD-10-CM | POA: Diagnosis not present

## 2017-01-26 DIAGNOSIS — E46 Unspecified protein-calorie malnutrition: Secondary | ICD-10-CM | POA: Diagnosis not present

## 2017-01-26 DIAGNOSIS — I679 Cerebrovascular disease, unspecified: Secondary | ICD-10-CM | POA: Diagnosis not present

## 2017-01-27 DIAGNOSIS — E46 Unspecified protein-calorie malnutrition: Secondary | ICD-10-CM | POA: Diagnosis not present

## 2017-01-27 DIAGNOSIS — E785 Hyperlipidemia, unspecified: Secondary | ICD-10-CM | POA: Diagnosis not present

## 2017-01-27 DIAGNOSIS — I10 Essential (primary) hypertension: Secondary | ICD-10-CM | POA: Diagnosis not present

## 2017-01-27 DIAGNOSIS — L89003 Pressure ulcer of unspecified elbow, stage 3: Secondary | ICD-10-CM | POA: Diagnosis not present

## 2017-01-27 DIAGNOSIS — I679 Cerebrovascular disease, unspecified: Secondary | ICD-10-CM | POA: Diagnosis not present

## 2017-01-27 DIAGNOSIS — I69959 Hemiplegia and hemiparesis following unspecified cerebrovascular disease affecting unspecified side: Secondary | ICD-10-CM | POA: Diagnosis not present

## 2017-01-28 DIAGNOSIS — I69959 Hemiplegia and hemiparesis following unspecified cerebrovascular disease affecting unspecified side: Secondary | ICD-10-CM | POA: Diagnosis not present

## 2017-01-28 DIAGNOSIS — L89003 Pressure ulcer of unspecified elbow, stage 3: Secondary | ICD-10-CM | POA: Diagnosis not present

## 2017-01-28 DIAGNOSIS — E46 Unspecified protein-calorie malnutrition: Secondary | ICD-10-CM | POA: Diagnosis not present

## 2017-01-28 DIAGNOSIS — I679 Cerebrovascular disease, unspecified: Secondary | ICD-10-CM | POA: Diagnosis not present

## 2017-01-28 DIAGNOSIS — I10 Essential (primary) hypertension: Secondary | ICD-10-CM | POA: Diagnosis not present

## 2017-01-28 DIAGNOSIS — E785 Hyperlipidemia, unspecified: Secondary | ICD-10-CM | POA: Diagnosis not present

## 2017-01-31 DIAGNOSIS — L89003 Pressure ulcer of unspecified elbow, stage 3: Secondary | ICD-10-CM | POA: Diagnosis not present

## 2017-01-31 DIAGNOSIS — E46 Unspecified protein-calorie malnutrition: Secondary | ICD-10-CM | POA: Diagnosis not present

## 2017-01-31 DIAGNOSIS — I679 Cerebrovascular disease, unspecified: Secondary | ICD-10-CM | POA: Diagnosis not present

## 2017-01-31 DIAGNOSIS — I69959 Hemiplegia and hemiparesis following unspecified cerebrovascular disease affecting unspecified side: Secondary | ICD-10-CM | POA: Diagnosis not present

## 2017-01-31 DIAGNOSIS — I10 Essential (primary) hypertension: Secondary | ICD-10-CM | POA: Diagnosis not present

## 2017-01-31 DIAGNOSIS — E785 Hyperlipidemia, unspecified: Secondary | ICD-10-CM | POA: Diagnosis not present

## 2017-02-01 DIAGNOSIS — E46 Unspecified protein-calorie malnutrition: Secondary | ICD-10-CM | POA: Diagnosis not present

## 2017-02-01 DIAGNOSIS — I10 Essential (primary) hypertension: Secondary | ICD-10-CM | POA: Diagnosis not present

## 2017-02-01 DIAGNOSIS — E785 Hyperlipidemia, unspecified: Secondary | ICD-10-CM | POA: Diagnosis not present

## 2017-02-01 DIAGNOSIS — L89003 Pressure ulcer of unspecified elbow, stage 3: Secondary | ICD-10-CM | POA: Diagnosis not present

## 2017-02-01 DIAGNOSIS — I679 Cerebrovascular disease, unspecified: Secondary | ICD-10-CM | POA: Diagnosis not present

## 2017-02-01 DIAGNOSIS — I69959 Hemiplegia and hemiparesis following unspecified cerebrovascular disease affecting unspecified side: Secondary | ICD-10-CM | POA: Diagnosis not present

## 2017-02-02 DIAGNOSIS — I679 Cerebrovascular disease, unspecified: Secondary | ICD-10-CM | POA: Diagnosis not present

## 2017-02-02 DIAGNOSIS — E46 Unspecified protein-calorie malnutrition: Secondary | ICD-10-CM | POA: Diagnosis not present

## 2017-02-02 DIAGNOSIS — L89003 Pressure ulcer of unspecified elbow, stage 3: Secondary | ICD-10-CM | POA: Diagnosis not present

## 2017-02-02 DIAGNOSIS — E785 Hyperlipidemia, unspecified: Secondary | ICD-10-CM | POA: Diagnosis not present

## 2017-02-02 DIAGNOSIS — I10 Essential (primary) hypertension: Secondary | ICD-10-CM | POA: Diagnosis not present

## 2017-02-02 DIAGNOSIS — I69959 Hemiplegia and hemiparesis following unspecified cerebrovascular disease affecting unspecified side: Secondary | ICD-10-CM | POA: Diagnosis not present

## 2017-02-03 DIAGNOSIS — I69959 Hemiplegia and hemiparesis following unspecified cerebrovascular disease affecting unspecified side: Secondary | ICD-10-CM | POA: Diagnosis not present

## 2017-02-03 DIAGNOSIS — I679 Cerebrovascular disease, unspecified: Secondary | ICD-10-CM | POA: Diagnosis not present

## 2017-02-03 DIAGNOSIS — E46 Unspecified protein-calorie malnutrition: Secondary | ICD-10-CM | POA: Diagnosis not present

## 2017-02-03 DIAGNOSIS — E785 Hyperlipidemia, unspecified: Secondary | ICD-10-CM | POA: Diagnosis not present

## 2017-02-03 DIAGNOSIS — L89003 Pressure ulcer of unspecified elbow, stage 3: Secondary | ICD-10-CM | POA: Diagnosis not present

## 2017-02-03 DIAGNOSIS — I10 Essential (primary) hypertension: Secondary | ICD-10-CM | POA: Diagnosis not present

## 2017-02-04 DIAGNOSIS — I10 Essential (primary) hypertension: Secondary | ICD-10-CM | POA: Diagnosis not present

## 2017-02-04 DIAGNOSIS — I69959 Hemiplegia and hemiparesis following unspecified cerebrovascular disease affecting unspecified side: Secondary | ICD-10-CM | POA: Diagnosis not present

## 2017-02-04 DIAGNOSIS — E46 Unspecified protein-calorie malnutrition: Secondary | ICD-10-CM | POA: Diagnosis not present

## 2017-02-04 DIAGNOSIS — I679 Cerebrovascular disease, unspecified: Secondary | ICD-10-CM | POA: Diagnosis not present

## 2017-02-04 DIAGNOSIS — E785 Hyperlipidemia, unspecified: Secondary | ICD-10-CM | POA: Diagnosis not present

## 2017-02-04 DIAGNOSIS — L89003 Pressure ulcer of unspecified elbow, stage 3: Secondary | ICD-10-CM | POA: Diagnosis not present

## 2017-02-06 DIAGNOSIS — I69959 Hemiplegia and hemiparesis following unspecified cerebrovascular disease affecting unspecified side: Secondary | ICD-10-CM | POA: Diagnosis not present

## 2017-02-06 DIAGNOSIS — I10 Essential (primary) hypertension: Secondary | ICD-10-CM | POA: Diagnosis not present

## 2017-02-06 DIAGNOSIS — L89003 Pressure ulcer of unspecified elbow, stage 3: Secondary | ICD-10-CM | POA: Diagnosis not present

## 2017-02-06 DIAGNOSIS — I679 Cerebrovascular disease, unspecified: Secondary | ICD-10-CM | POA: Diagnosis not present

## 2017-02-06 DIAGNOSIS — E785 Hyperlipidemia, unspecified: Secondary | ICD-10-CM | POA: Diagnosis not present

## 2017-02-06 DIAGNOSIS — E46 Unspecified protein-calorie malnutrition: Secondary | ICD-10-CM | POA: Diagnosis not present

## 2017-02-07 ENCOUNTER — Emergency Department (HOSPITAL_COMMUNITY)

## 2017-02-07 ENCOUNTER — Encounter (HOSPITAL_COMMUNITY): Payer: Self-pay | Admitting: Emergency Medicine

## 2017-02-07 ENCOUNTER — Inpatient Hospital Stay (HOSPITAL_COMMUNITY)
Admission: EM | Admit: 2017-02-07 | Discharge: 2017-02-12 | DRG: 871 | Disposition: A | Discharge status: Hospice | Attending: Internal Medicine | Admitting: Internal Medicine

## 2017-02-07 DIAGNOSIS — Y95 Nosocomial condition: Secondary | ICD-10-CM | POA: Diagnosis present

## 2017-02-07 DIAGNOSIS — Z79899 Other long term (current) drug therapy: Secondary | ICD-10-CM

## 2017-02-07 DIAGNOSIS — J189 Pneumonia, unspecified organism: Secondary | ICD-10-CM | POA: Diagnosis present

## 2017-02-07 DIAGNOSIS — I69959 Hemiplegia and hemiparesis following unspecified cerebrovascular disease affecting unspecified side: Secondary | ICD-10-CM | POA: Diagnosis not present

## 2017-02-07 DIAGNOSIS — M6249 Contracture of muscle, multiple sites: Secondary | ICD-10-CM | POA: Diagnosis present

## 2017-02-07 DIAGNOSIS — G14 Postpolio syndrome: Secondary | ICD-10-CM

## 2017-02-07 DIAGNOSIS — E870 Hyperosmolality and hypernatremia: Secondary | ICD-10-CM | POA: Diagnosis present

## 2017-02-07 DIAGNOSIS — Z515 Encounter for palliative care: Secondary | ICD-10-CM | POA: Diagnosis present

## 2017-02-07 DIAGNOSIS — I10 Essential (primary) hypertension: Secondary | ICD-10-CM | POA: Diagnosis not present

## 2017-02-07 DIAGNOSIS — E785 Hyperlipidemia, unspecified: Secondary | ICD-10-CM | POA: Diagnosis present

## 2017-02-07 DIAGNOSIS — L89153 Pressure ulcer of sacral region, stage 3: Secondary | ICD-10-CM | POA: Diagnosis present

## 2017-02-07 DIAGNOSIS — A419 Sepsis, unspecified organism: Secondary | ICD-10-CM | POA: Diagnosis not present

## 2017-02-07 DIAGNOSIS — D649 Anemia, unspecified: Secondary | ICD-10-CM | POA: Diagnosis present

## 2017-02-07 DIAGNOSIS — B370 Candidal stomatitis: Secondary | ICD-10-CM | POA: Diagnosis not present

## 2017-02-07 DIAGNOSIS — Z87891 Personal history of nicotine dependence: Secondary | ICD-10-CM

## 2017-02-07 DIAGNOSIS — Z681 Body mass index (BMI) 19 or less, adult: Secondary | ICD-10-CM

## 2017-02-07 DIAGNOSIS — E43 Unspecified severe protein-calorie malnutrition: Secondary | ICD-10-CM | POA: Diagnosis present

## 2017-02-07 DIAGNOSIS — R1313 Dysphagia, pharyngeal phase: Secondary | ICD-10-CM | POA: Diagnosis present

## 2017-02-07 DIAGNOSIS — Z8673 Personal history of transient ischemic attack (TIA), and cerebral infarction without residual deficits: Secondary | ICD-10-CM

## 2017-02-07 DIAGNOSIS — I679 Cerebrovascular disease, unspecified: Secondary | ICD-10-CM | POA: Diagnosis not present

## 2017-02-07 DIAGNOSIS — Z1624 Resistance to multiple antibiotics: Secondary | ICD-10-CM | POA: Diagnosis present

## 2017-02-07 DIAGNOSIS — L89003 Pressure ulcer of unspecified elbow, stage 3: Secondary | ICD-10-CM | POA: Diagnosis not present

## 2017-02-07 DIAGNOSIS — R509 Fever, unspecified: Secondary | ICD-10-CM | POA: Diagnosis not present

## 2017-02-07 DIAGNOSIS — E46 Unspecified protein-calorie malnutrition: Secondary | ICD-10-CM | POA: Diagnosis not present

## 2017-02-07 DIAGNOSIS — Z7401 Bed confinement status: Secondary | ICD-10-CM

## 2017-02-07 DIAGNOSIS — E876 Hypokalemia: Secondary | ICD-10-CM | POA: Diagnosis present

## 2017-02-07 DIAGNOSIS — B91 Sequelae of poliomyelitis: Secondary | ICD-10-CM

## 2017-02-07 DIAGNOSIS — Z7982 Long term (current) use of aspirin: Secondary | ICD-10-CM

## 2017-02-07 DIAGNOSIS — E860 Dehydration: Secondary | ICD-10-CM | POA: Diagnosis present

## 2017-02-07 DIAGNOSIS — Z66 Do not resuscitate: Secondary | ICD-10-CM | POA: Diagnosis not present

## 2017-02-07 DIAGNOSIS — Z8249 Family history of ischemic heart disease and other diseases of the circulatory system: Secondary | ICD-10-CM

## 2017-02-07 DIAGNOSIS — M6259 Muscle wasting and atrophy, not elsewhere classified, multiple sites: Secondary | ICD-10-CM | POA: Diagnosis present

## 2017-02-07 DIAGNOSIS — Z8701 Personal history of pneumonia (recurrent): Secondary | ICD-10-CM

## 2017-02-07 LAB — I-STAT CG4 LACTIC ACID, ED: LACTIC ACID, VENOUS: 1.37 mmol/L (ref 0.5–1.9)

## 2017-02-07 LAB — CBC WITH DIFFERENTIAL/PLATELET
Basophils Absolute: 0 10*3/uL (ref 0.0–0.1)
Basophils Relative: 0 %
EOS ABS: 0 10*3/uL (ref 0.0–0.7)
EOS PCT: 0 %
HCT: 34.9 % — ABNORMAL LOW (ref 39.0–52.0)
Hemoglobin: 11.1 g/dL — ABNORMAL LOW (ref 13.0–17.0)
LYMPHS ABS: 1 10*3/uL (ref 0.7–4.0)
Lymphocytes Relative: 9 %
MCH: 28.8 pg (ref 26.0–34.0)
MCHC: 31.8 g/dL (ref 30.0–36.0)
MCV: 90.6 fL (ref 78.0–100.0)
MONO ABS: 0.8 10*3/uL (ref 0.1–1.0)
Monocytes Relative: 7 %
NEUTROS PCT: 84 %
Neutro Abs: 9.4 10*3/uL — ABNORMAL HIGH (ref 1.7–7.7)
PLATELETS: 225 10*3/uL (ref 150–400)
RBC: 3.85 MIL/uL — AB (ref 4.22–5.81)
RDW: 17.4 % — AB (ref 11.5–15.5)
WBC: 11.2 10*3/uL — ABNORMAL HIGH (ref 4.0–10.5)

## 2017-02-07 LAB — COMPREHENSIVE METABOLIC PANEL
ALK PHOS: 56 U/L (ref 38–126)
ALT: 10 U/L — AB (ref 17–63)
AST: 15 U/L (ref 15–41)
Albumin: 2.4 g/dL — ABNORMAL LOW (ref 3.5–5.0)
Anion gap: 13 (ref 5–15)
BILIRUBIN TOTAL: 1.6 mg/dL — AB (ref 0.3–1.2)
BUN: 26 mg/dL — AB (ref 6–20)
CALCIUM: 7.8 mg/dL — AB (ref 8.9–10.3)
CO2: 22 mmol/L (ref 22–32)
CREATININE: 0.79 mg/dL (ref 0.61–1.24)
Chloride: 116 mmol/L — ABNORMAL HIGH (ref 101–111)
Glucose, Bld: 120 mg/dL — ABNORMAL HIGH (ref 65–99)
Potassium: 2.1 mmol/L — CL (ref 3.5–5.1)
Sodium: 151 mmol/L — ABNORMAL HIGH (ref 135–145)
TOTAL PROTEIN: 5.8 g/dL — AB (ref 6.5–8.1)

## 2017-02-07 MED ORDER — VANCOMYCIN HCL IN DEXTROSE 1-5 GM/200ML-% IV SOLN
1000.0000 mg | Freq: Once | INTRAVENOUS | Status: AC
  Administered 2017-02-07: 1000 mg via INTRAVENOUS
  Filled 2017-02-07: qty 200

## 2017-02-07 MED ORDER — PIPERACILLIN-TAZOBACTAM 3.375 G IVPB 30 MIN
3.3750 g | Freq: Once | INTRAVENOUS | Status: AC
  Administered 2017-02-07: 3.375 g via INTRAVENOUS
  Filled 2017-02-07: qty 50

## 2017-02-07 MED ORDER — SODIUM CHLORIDE 0.9 % IV BOLUS (SEPSIS)
1000.0000 mL | Freq: Once | INTRAVENOUS | Status: AC
  Administered 2017-02-07: 1000 mL via INTRAVENOUS

## 2017-02-07 MED ORDER — SODIUM CHLORIDE 0.9 % IV BOLUS (SEPSIS)
250.0000 mL | Freq: Once | INTRAVENOUS | Status: AC
  Administered 2017-02-07: 250 mL via INTRAVENOUS

## 2017-02-07 MED ORDER — POTASSIUM CHLORIDE 10 MEQ/100ML IV SOLN
10.0000 meq | INTRAVENOUS | Status: AC
  Administered 2017-02-07 – 2017-02-08 (×4): 10 meq via INTRAVENOUS
  Filled 2017-02-07 (×4): qty 100

## 2017-02-07 NOTE — ED Notes (Signed)
Bed: WA09 Expected date:  Expected time:  Means of arrival:  Comments: EMS 66 yo male febrile-pneumonia 3 weeks ago-ST 120

## 2017-02-07 NOTE — ED Triage Notes (Addendum)
Per EMS , pt. From home with complaint  of SOB and fever .per family pt. Had pneumonia 2 weeks ago , medicated. This evening pt. Noted with SOB and has temp. Of 99.7'. Pt. Has language barrier, don't speak english , baseline alert and oriented x2. Per EMS, pt. Negative for wheezes. Per record , pt. Is a HOSPICE pt.

## 2017-02-07 NOTE — ED Provider Notes (Signed)
[facility] DEPT Provider Note   CSN: [id number] Arrival date & time: 02/07/17  2119     History   Chief Complaint Chief Complaint  Patient presents with  . Shortness of Breath  . Fever    HPI [name] is a 66 y.o. male.  Patient is a 66 year old male who is currently under hospice care but full code with a history of hypertension, polio with postpolio myelitis muscular atrophy, quadriplegia, contractures, CVA who was recently hospitalized and discharged on 11 October after having aspiration and strep pneumonia.  Patient is not taking any antibiotics at this time.  Yesterday he started having decreased appetite, and some shortness of breath, fever and strong smelling urine.  Family states he will occasionally complain of abdominal pain but has not had excessive vomiting or diarrhea.   The history is provided by the patient and a caregiver.  Shortness of Breath  This is a new problem. The average episode lasts 2 days. The problem occurs frequently.The current episode started yesterday. The problem has been gradually worsening. Associated symptoms include a fever, cough and abdominal pain. Pertinent negatives include no sputum production, no vomiting and no leg swelling. Associated symptoms comments: No diarrhea, foul-smelling urine, anorexia. He has had prior hospitalizations. He has had prior ED visits. He has had prior ICU admissions. Associated medical issues include pneumonia.  Fever   Associated symptoms include cough. Pertinent negatives include no vomiting.    Past Medical History:  Diagnosis Date  . Hypertension   . Polio   . Stroke [facility])     Patient Active Problem List   Diagnosis Date Noted  . Pressure injury of skin 01/18/2017  . Encounter for palliative care   . Goals of care, counseling/discussion   . CAP (community acquired pneumonia) 01/15/2017  . Acute respiratory failure with hypoxia ([place]) 01/15/2017  . Sepsis ([place])  01/15/2017  . Lactic acidosis 01/15/2017  . Hypokalemia 01/15/2017  . HLD (hyperlipidemia) 01/15/2017  . Prolonged QT interval 01/15/2017  . Contracture of ankle and foot joint 06/16/2015  . Dry eyes 06/16/2015  . Nontraumatic subcortical hemorrhage of right cerebral hemisphere ([place]) 06/16/2015  . Essential hypertension 11/19/2014  . PBA (pseudobulbar affect) 05/09/2014  . Nontraumatic subcortical hemorrhage of cerebral hemisphere ([place]) 05/09/2014  . Hypertensive emergency 05/09/2014  . Quadriplegia and quadriparesis ([place]) 12/31/2013  . Tendinitis of left ankle 09/14/2013  . Arthritis of ankle, left, degenerative 09/14/2013  . Gout attack 09/14/2013  . Dehydration 09/14/2013  . Depression with anxiety 09/14/2013  . HTN (hypertension) 08/29/2013  . 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 Acute embolic stroke ([place]) 44/31/5400  . ICH (intracerebral hemorrhage) ([place]) 08/22/2013  . Stroke [facility]) 08/22/2013    Past Surgical History:  Procedure Laterality Date  . NO PAST SURGERIES         Home Medications    Prior to Admission medications   Medication Sig Start Date End Date Taking? Authorizing Provider  amLODipine (NORVASC) 10 MG tablet Take 1 tablet (10 mg total) by mouth daily. 06/16/15  Yes Rosalin Hawking, MD  aspirin EC 81 MG tablet Take 1 tablet (81 mg total) by mouth daily. 01/02/14  Yes Rosalin Hawking, MD  atorvastatin (LIPITOR) 10 MG tablet Take 10 mg by mouth daily. 11/15/16  Yes [provider]  cloNIDine (CATAPRES) 0.1 MG tablet Take 1 tablet (0.1 mg total) by mouth 3 (three) times daily. 06/16/15  Yes Rosalin Hawking, MD  guaiFENesin (MUCINEX) 600 MG 12 hr tablet Take 600 mg  by mouth 2 (two) times daily as needed for to loosen phlegm.   Yes [provider]  ipratropium-albuterol (DUONEB) 0.5-2.5 (3) MG/3ML SOLN Take 3 mLs by nebulization 3 (three) times daily. 01/20/17  Yes Hosie Poisson, MD  losartan (COZAAR) 100 MG tablet Take 0.5 tablets (50 mg total) by  mouth daily. 06/16/15  Yes Rosalin Hawking, MD  Maltodextrin-Xanthan Gum ([place]) POWD Use with every meal. 01/20/17  Yes Hosie Poisson, MD  naproxen (NAPROSYN) 500 MG tablet Take 500 mg by mouth 2 (two) times daily with a meal.   Yes [provider]  NEXIUM 40 MG capsule Take 40 mg by mouth daily.  10/24/14  Yes [provider]  sertraline (ZOLOFT) 100 MG tablet Take 100 mg by mouth daily.   Yes [provider]    Family History Family History  Problem Relation Age of Onset  . Hypertension Mother   . Hypertension Father     Social History Social History  Substance Use Topics  . Smoking status: Former Smoker    Types: Cigarettes  . Smokeless tobacco: Never Used  . Alcohol use No     Allergies   Patient has no known allergies.   Review of Systems Review of Systems  Constitutional: Positive for fever.  Respiratory: Positive for cough and shortness of breath. Negative for sputum production.   Cardiovascular: Negative for leg swelling.  Gastrointestinal: Positive for abdominal pain. Negative for vomiting.  All other systems reviewed and are negative.    Physical Exam Updated Vital Signs BP (!) 159/101 (BP Location: Right Arm)   Pulse (!) 116   Temp 98 F (36.7 C) (Oral)   Resp 18   SpO2 97% Comment: RA  Physical Exam  Constitutional: He appears well-developed and well-nourished. No distress.  HENT:  Head: Normocephalic and atraumatic.  Mouth/Throat: Oropharynx is clear and moist. Mucous membranes are dry.  Poor dentition  Eyes: Pupils are equal, round, and reactive to light. Conjunctivae and EOM are normal.  Neck: Normal range of motion. Neck supple.  Cardiovascular: Regular rhythm and intact distal pulses.  Tachycardia present.   No murmur heard. Pulmonary/Chest: Effort normal. No respiratory distress. He has decreased breath sounds in the right lower field and the left lower field. He has no wheezes. He has no rales.    Abdominal: Soft. He exhibits no distension. There is no tenderness. There is no rebound and no guarding.  Musculoskeletal: Normal range of motion. He exhibits no edema or tenderness.  Fractures of bilateral upper and lower extremities.  Stage II decubitus ulcers over the coccyx.  No deeper tunneling ulcers noted.  Neurological: He is alert.  Skin: Skin is warm and dry. No rash noted. No erythema.  Psychiatric: He has a normal mood and affect. His behavior is normal.  Nursing note and vitals reviewed.    ED Treatments / Results  Labs (all labs ordered are listed, but only abnormal results are displayed) Labs Reviewed  COMPREHENSIVE METABOLIC PANEL - Abnormal; Notable for the following:       Result Value   Sodium 151 (*)    Potassium 2.1 (*)    Chloride 116 (*)    Glucose, Bld 120 (*)    BUN 26 (*)    Calcium 7.8 (*)    Total Protein 5.8 (*)    Albumin 2.4 (*)    ALT 10 (*)    Total Bilirubin 1.6 (*)    All other components within normal limits  CBC WITH  DIFFERENTIAL/PLATELET - Abnormal; Notable for the following:    WBC 11.2 (*)    RBC 3.85 (*)    Hemoglobin 11.1 (*)    HCT 34.9 (*)    RDW 17.4 (*)    Neutro Abs 9.4 (*)    All other components within normal limits  CULTURE, BLOOD (ROUTINE X 2)  CULTURE, BLOOD (ROUTINE X 2)  URINE CULTURE  URINALYSIS, ROUTINE W REFLEX MICROSCOPIC  I-STAT CG4 LACTIC ACID, ED  I-STAT CG4 LACTIC ACID, ED    EKG  EKG Interpretation  Date/Time:  Monday February 07 2017 22:09:40 EDT Ventricular Rate:  108 PR Interval:    QRS Duration: 97 QT Interval:  311 QTC Calculation: 417 R Axis:   106 Text Interpretation:  Sinus tachycardia Multiple premature complexes, vent & supraven Right axis deviation Repol abnrm suggests ischemia, diffuse leads No significant change since last tracing Confirmed by Blanchie Dessert ([id number]) on 02/07/2017 10:37:46 PM       Radiology Dg Chest Port 1 View  Result Date: 02/07/2017 CLINICAL DATA:  66 y/o   M; diminished breathing starting yesterday. EXAM: PORTABLE CHEST 1 VIEW COMPARISON:  01/16/2017 chest radiograph. FINDINGS: Stable cardiac silhouette within normal limits given projection and technique. Consolidation in the left lung base. No pleural effusion or pneumothorax. Bones are unremarkable. IMPRESSION: Left lung base consolidation suspicious for pneumonia. Electronically Signed   By: Kristine Garbe M.D.   On: 02/07/2017 22:24    Procedures Procedures (including critical care time)  Medications Ordered in ED Medications  potassium chloride 10 mEq in 100 mL IVPB (10 mEq Intravenous New Bag/Given 02/07/17 2332)  vancomycin (VANCOCIN) IVPB 1000 mg/200 mL premix (not administered)  piperacillin-tazobactam (ZOSYN) IVPB 3.375 g (3.375 g Intravenous New Bag/Given 02/07/17 2332)  sodium chloride 0.9 % bolus 1,000 mL (0 mLs Intravenous Stopped 02/07/17 2310)    And  sodium chloride 0.9 % bolus 250 mL (0 mLs Intravenous Stopped 02/07/17 2326)     Initial Impression / Assessment and Plan / ED Course  I have reviewed the triage vital signs and the nursing notes.  Pertinent labs & imaging results that were available during my care of the patient were reviewed by me and considered in my medical decision making (see chart for details).     Patient presenting with symptoms concerning for code sepsis.  Patient is febrile, tachycardic with recent admission for pneumonia.  Patient is satting normally on room air without tachypnea at this time.  Concern for potential recurrent pneumonia versus urinary infection.  CBC, CMP, UA, blood cultures, lactic acid, urine cultures, chest x-ray pending.  Patient was given IV fluids.  11:32 PM Patient's labs show hypernatremia, hypokalemia, mild leukocytosis of 11,000, normal lactic acid and chest x-ray with concern for left lower lobe pneumonia.  Patient started with vancomycin and Zosyn.  UA is still pending.  Patient started on IV potassium and IV  fluids.  Patient will be admitted.  CRITICAL CARE Performed by: Blanchie Dessert Total critical care time: 30 minutes Critical care time was exclusive of separately billable procedures and treating other patients. Critical care was necessary to treat or prevent imminent or life-threatening deterioration. Critical care was time spent personally by me on the following activities: development of treatment plan with patient and/or surrogate as well as nursing, discussions with consultants, evaluation of patient's response to treatment, examination of patient, obtaining history from patient or surrogate, ordering and performing treatments and interventions, ordering and review of laboratory studies, ordering and review of radiographic studies, pulse  oximetry and re-evaluation of patient's condition.   Final Clinical Impressions(s) / ED Diagnoses   Final diagnoses:  HCAP (healthcare-associated pneumonia)  Hypernatremia  Hypokalemia    New Prescriptions New Prescriptions   No medications on file     Blanchie Dessert, MD 02/07/17 2333

## 2017-02-07 NOTE — H&P (Signed)
History and Physical    [name] VHQ:[id number] DOB: [birth year] DOA: 02/07/2017  PCP: Josetta Huddle, MD - patient is enrolled in Hospice  Patient coming from: Home  Chief Complaint:  SOB and fever  HPI: [name] is a 66 y.o. 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  Family was not present at the time of my evaluation and the patient is unable to communicate other than to smile and answer simple yes/no questions (this may be due to language barrier, but it is my understanding that the patient did not speak while his family was present either).    He was hospitalized from 10/6-11 for acute respiratory failure with hypoxia secondary to aspiration PNA/streptococcal PNA with sepsis physiology.  He initially required BIPAP and was weaned to [place] O2.  He was recommended to have a dysphagia 1 diet with nectar thick liquids.    HPI per Dr. Maryan Rued: Shortness of Breath  This is a new problem. The average episode lasts 2 days. The problem occurs frequently.The current episode started yesterday. The problem has been gradually worsening. Associated symptoms include a fever, cough and abdominal pain. Pertinent negatives include no sputum production, no vomiting and no leg swelling. Associated symptoms comments: No diarrhea, foul-smelling urine, anorexia. He has had prior hospitalizations. He has had prior ED visits. He has had prior ICU admissions. Associated medical issues include pneumonia.  Fever  Associated symptoms include cough. Pertinent negatives include no vomiting.   ED Course: Code sepsis.  Normal O2 sats on RA with fever, tachycardia.  Labs show hypernatremia, hypokalemia, WBC 11,000, normal lactate, CXR with LLL PNA.  Given Vanc/Zosyn.  UA pending.  IV KCl and IVF ordered.  Review of Systems:  Unable to  obtain  Ambulatory Status:  Non-ambulatory  Past Medical History:  Diagnosis Date  . Hypertension   . Polio   . Stroke [facility])     Past Surgical History:  Procedure Laterality Date  . NO PAST SURGERIES      Social History   Social History  . Marital status: Married    Spouse name: Catha Nottingham  . Number of children: 3  . Years of education: 11th   Occupational History  . retired    Social History Main Topics  . Smoking status: Former Smoker    Types: Cigarettes  . Smokeless tobacco: Never Used  . Alcohol use No  . Drug use: No  . Sexual activity: No   Other Topics Concern  . Not on file   Social History Narrative   Patient is currently in [place]   Patient is right handed   Patient drink tea,coffee          No Known Allergies  Family History  Problem Relation Age of Onset  . Hypertension Mother   . Hypertension Father     Prior to Admission medications   Medication Sig Start Date End Date Taking? Authorizing Provider  amLODipine (NORVASC) 10 MG tablet Take 1 tablet (10 mg total) by mouth daily. 06/16/15  Yes Rosalin Hawking, MD  aspirin EC 81 MG tablet Take 1 tablet (81 mg total) by mouth daily. 01/02/14  Yes Rosalin Hawking, MD  atorvastatin (LIPITOR) 10 MG tablet Take 10 mg by mouth daily. 11/15/16  Yes [provider]  cloNIDine (CATAPRES) 0.1 MG tablet Take 1 tablet (0.1 mg  total) by mouth 3 (three) times daily. 06/16/15  Yes Rosalin Hawking, MD  guaiFENesin (MUCINEX) 600 MG 12 hr tablet Take 600 mg by mouth 2 (two) times daily as needed for to loosen phlegm.   Yes [provider]  ipratropium-albuterol (DUONEB) 0.5-2.5 (3) MG/3ML SOLN Take 3 mLs by nebulization 3 (three) times daily. 01/20/17  Yes Hosie Poisson, MD  losartan (COZAAR) 100 MG tablet Take 0.5 tablets (50 mg total) by mouth daily. 06/16/15  Yes Rosalin Hawking, MD  Maltodextrin-Xanthan Gum ([place]) POWD Use with every meal. 01/20/17  Yes Hosie Poisson, MD  naproxen (NAPROSYN) 500  MG tablet Take 500 mg by mouth 2 (two) times daily with a meal.   Yes [provider]  NEXIUM 40 MG capsule Take 40 mg by mouth daily.  10/24/14  Yes [provider]  sertraline (ZOLOFT) 100 MG tablet Take 100 mg by mouth daily.   Yes [provider]    Physical Exam: Vitals:   02/07/17 2200 02/07/17 2243 02/07/17 2330 02/07/17 2355  BP: 139/85   (!) 141/72  Pulse: 100  98 (!) 102  Resp:   (!) 22 (!) 22  Temp:  (!) 100.9 F (38.3 C)    TempSrc:  Rectal    SpO2: 97%  97% 97%     General:  Appears calm and comfortable and is NAD Eyes:  PERRL, EOMI, normal lids, iris ENT:  grossly normal hearing, lips & tongue, mmm; poor dentition Neck:  no LAD, masses or thyromegaly; no carotid bruits Cardiovascular: Tachycardia, no m/r/g. No LE edema.  Respiratory:   CTA bilaterally with no wheezes/rales/rhonchi. Mildly increased respiratory effort. Abdomen:  soft, NT, ND, NABS Skin: Stage 3 decubitus ulcer (per RN) Musculoskeletal: Marked contractures of B LE > UE. Psychiatric: He smiles pleasantly and appears to say he doesn't have pain but otherwise is nonverbal. Neurologic: Unable to perform    Radiological Exams on Admission: Dg Chest Port 1 View  Result Date: 02/07/2017 CLINICAL DATA:  66 y/o  M; diminished breathing starting yesterday. EXAM: PORTABLE CHEST 1 VIEW COMPARISON:  01/16/2017 chest radiograph. FINDINGS: Stable cardiac silhouette within normal limits given projection and technique. Consolidation in the left lung base. No pleural effusion or pneumothorax. Bones are unremarkable. IMPRESSION: Left lung base consolidation suspicious for pneumonia. Electronically Signed   By: Kristine Garbe M.D.   On: 02/07/2017 22:24    EKG: Independently reviewed.  Sinus tachycardia with rate 108; PVCs with early repolarization abnormality, NSCSLT   Labs on Admission: I have personally reviewed the available labs and imaging studies at the time of the  admission.  Pertinent labs:   Lactate 1.37, 1.25 Na++ 151 K+ 2.1 Glucose 120 Albumin 2.4 Bili 1.6 WBC 11.2 Hgb 11.1   Assessment/Plan Principal Problem:   HCAP (healthcare-associated pneumonia) Active Problems:   Post-poliomyelitis muscular atrophy   Essential hypertension   Sepsis ([place])   Hypokalemia   Severe protein-calorie malnutrition (HCC)   Pressure ulcer of coccygeal region, stage 3 ([place])   HCAP with sepsis -Elevated WBC count, fever, tachycardia, tachypnea with normal lactate -While awaiting blood cultures, this appears to be a preseptic condition. -Sepsis protocol initiated --Given productive cough, fever, and infiltrate in left lower lobe on chest x-ray , most likely pneumonia.  - He was previously recently admitted for PNA and so this would qualify as HCAP. -Aspiration PNA was previously a concern, although LLL PNA is less likely to be related to aspiration. -CURB-65 score is 2 - will admit  the patient to Med Surg. -Pneumonia Severity Index (PSI) is Class 4, 9.3% mortality. -Corticosteroids have been to shown to low overall mortality rate; risk of ARDS; and need for mechanical ventilation.  This is particularly true in severe PNA (class 3+ PSI).  Will add 40 mg prednisone daily for 3-7 days. -The patient has the following criteria for MDR (multi-drug resistance): IV antibiotics in the last 90 days;  >2 days of inpatient treatment within the last 90 days. -The patient will need treatment for HCAP due to MDR risk factors as above; will treat with Cefepime and Vancomycin. -Additional complicating factors include: associated dehydration as evidenced by his hypernatremia;and chronic poor mobility. -NS @ 75cc/hr -Fever control -Repeat CBC in am -Sputum cultures -Blood cultures -Strep pneumo testing -Will order lower respiratory tract procalcitonin level.  Antibiotics would not be indicated for PCT <0.1 and probably should not be used for < 0.25.  >0.5 indicates  infection and >>0.5 indicates more serious disease.  As the procalcitonin level normalizes, it will be reasonable to consider de-escalation of antibiotic coverage. -Standing Duonebs -Blood and urine cultures pending -Patient is on hospice; hospice nurse was notified of his admission and they will plan to see him in the AM  Hypokalemia -Patient's K+ was quite low at 2.1 -He was given 40 mEq IV KCl in the ER, which would be expected to increase his K+ to 2.5 -Will give an additional 40 mEq KCl in IVF at 125 cc/hour -Recheck BMP in AM  Hypertension -Continue Norvasc, Clonidine, Cozaar  Post-polio syndrome -He is quite debilitated -He is on bed rest  Severe malnutrition -Nutrition consult -Previously worse but appears to be mildly better at this time  Stage 3 pressure ulcer -Wound care consult  DVT prophylaxis: Lovenox Code Status:  Full - confirmed with Hospice nurse Family Communication: None present Disposition Plan:  Home once clinically improved Consults called: Hospice; Wound care, nutrition Admission status: Admit - It is my clinical opinion that admission to INPATIENT is reasonable and necessary because this patient will require at least 2 midnights in the hospital to treat this condition based on the medical complexity of the problems presented.  Given the aforementioned information, the predictability of an adverse outcome is felt to be significant.    Karmen Bongo MD [facility]  If note is complete, please contact covering daytime or nighttime physician. [email or web address] Password [email or web address]  02/08/2017, 12:32 AM

## 2017-02-08 DIAGNOSIS — D649 Anemia, unspecified: Secondary | ICD-10-CM | POA: Diagnosis present

## 2017-02-08 DIAGNOSIS — H04123 Dry eye syndrome of bilateral lacrimal glands: Secondary | ICD-10-CM | POA: Diagnosis not present

## 2017-02-08 DIAGNOSIS — F339 Major depressive disorder, recurrent, unspecified: Secondary | ICD-10-CM | POA: Diagnosis not present

## 2017-02-08 DIAGNOSIS — E43 Unspecified severe protein-calorie malnutrition: Secondary | ICD-10-CM | POA: Diagnosis not present

## 2017-02-08 DIAGNOSIS — Y95 Nosocomial condition: Secondary | ICD-10-CM | POA: Diagnosis present

## 2017-02-08 DIAGNOSIS — I10 Essential (primary) hypertension: Secondary | ICD-10-CM | POA: Diagnosis not present

## 2017-02-08 DIAGNOSIS — Z1624 Resistance to multiple antibiotics: Secondary | ICD-10-CM | POA: Diagnosis present

## 2017-02-08 DIAGNOSIS — L89153 Pressure ulcer of sacral region, stage 3: Secondary | ICD-10-CM | POA: Diagnosis present

## 2017-02-08 DIAGNOSIS — E785 Hyperlipidemia, unspecified: Secondary | ICD-10-CM | POA: Diagnosis not present

## 2017-02-08 DIAGNOSIS — Z66 Do not resuscitate: Secondary | ICD-10-CM | POA: Diagnosis not present

## 2017-02-08 DIAGNOSIS — Z681 Body mass index (BMI) 19 or less, adult: Secondary | ICD-10-CM | POA: Diagnosis not present

## 2017-02-08 DIAGNOSIS — I679 Cerebrovascular disease, unspecified: Secondary | ICD-10-CM | POA: Diagnosis not present

## 2017-02-08 DIAGNOSIS — M6249 Contracture of muscle, multiple sites: Secondary | ICD-10-CM | POA: Diagnosis present

## 2017-02-08 DIAGNOSIS — R509 Fever, unspecified: Secondary | ICD-10-CM | POA: Diagnosis present

## 2017-02-08 DIAGNOSIS — I69959 Hemiplegia and hemiparesis following unspecified cerebrovascular disease affecting unspecified side: Secondary | ICD-10-CM | POA: Diagnosis not present

## 2017-02-08 DIAGNOSIS — J189 Pneumonia, unspecified organism: Secondary | ICD-10-CM | POA: Diagnosis not present

## 2017-02-08 DIAGNOSIS — Z515 Encounter for palliative care: Secondary | ICD-10-CM | POA: Diagnosis not present

## 2017-02-08 DIAGNOSIS — E860 Dehydration: Secondary | ICD-10-CM | POA: Diagnosis present

## 2017-02-08 DIAGNOSIS — Z7189 Other specified counseling: Secondary | ICD-10-CM | POA: Diagnosis not present

## 2017-02-08 DIAGNOSIS — K219 Gastro-esophageal reflux disease without esophagitis: Secondary | ICD-10-CM | POA: Diagnosis not present

## 2017-02-08 DIAGNOSIS — R1312 Dysphagia, oropharyngeal phase: Secondary | ICD-10-CM | POA: Diagnosis not present

## 2017-02-08 DIAGNOSIS — G14 Postpolio syndrome: Secondary | ICD-10-CM | POA: Diagnosis not present

## 2017-02-08 DIAGNOSIS — E46 Unspecified protein-calorie malnutrition: Secondary | ICD-10-CM | POA: Diagnosis not present

## 2017-02-08 DIAGNOSIS — A419 Sepsis, unspecified organism: Secondary | ICD-10-CM | POA: Diagnosis present

## 2017-02-08 DIAGNOSIS — R1313 Dysphagia, pharyngeal phase: Secondary | ICD-10-CM | POA: Diagnosis present

## 2017-02-08 DIAGNOSIS — Z8673 Personal history of transient ischemic attack (TIA), and cerebral infarction without residual deficits: Secondary | ICD-10-CM | POA: Diagnosis not present

## 2017-02-08 DIAGNOSIS — E870 Hyperosmolality and hypernatremia: Secondary | ICD-10-CM | POA: Diagnosis not present

## 2017-02-08 DIAGNOSIS — B91 Sequelae of poliomyelitis: Secondary | ICD-10-CM | POA: Diagnosis not present

## 2017-02-08 DIAGNOSIS — L89003 Pressure ulcer of unspecified elbow, stage 3: Secondary | ICD-10-CM | POA: Diagnosis not present

## 2017-02-08 DIAGNOSIS — M6259 Muscle wasting and atrophy, not elsewhere classified, multiple sites: Secondary | ICD-10-CM | POA: Diagnosis present

## 2017-02-08 DIAGNOSIS — Z87891 Personal history of nicotine dependence: Secondary | ICD-10-CM | POA: Diagnosis not present

## 2017-02-08 DIAGNOSIS — Z7982 Long term (current) use of aspirin: Secondary | ICD-10-CM | POA: Diagnosis not present

## 2017-02-08 DIAGNOSIS — E876 Hypokalemia: Secondary | ICD-10-CM | POA: Diagnosis present

## 2017-02-08 DIAGNOSIS — Z79899 Other long term (current) drug therapy: Secondary | ICD-10-CM | POA: Diagnosis not present

## 2017-02-08 DIAGNOSIS — Z7401 Bed confinement status: Secondary | ICD-10-CM | POA: Diagnosis not present

## 2017-02-08 DIAGNOSIS — B370 Candidal stomatitis: Secondary | ICD-10-CM | POA: Diagnosis not present

## 2017-02-08 LAB — BASIC METABOLIC PANEL
Anion gap: 8 (ref 5–15)
BUN: 18 mg/dL (ref 6–20)
CALCIUM: 7.6 mg/dL — AB (ref 8.9–10.3)
CO2: 22 mmol/L (ref 22–32)
Chloride: 121 mmol/L — ABNORMAL HIGH (ref 101–111)
Creatinine, Ser: 0.48 mg/dL — ABNORMAL LOW (ref 0.61–1.24)
GFR calc Af Amer: >60 mL/min (ref >60)
GLUCOSE: 149 mg/dL — AB (ref 65–99)
Potassium: 3.1 mmol/L — ABNORMAL LOW (ref 3.5–5.1)
SODIUM: 151 mmol/L — AB (ref 135–145)

## 2017-02-08 LAB — CBC WITH DIFFERENTIAL/PLATELET
BASOS PCT: 0 %
Basophils Absolute: 0 10*3/uL (ref 0.0–0.1)
EOS PCT: 0 %
Eosinophils Absolute: 0 10*3/uL (ref 0.0–0.7)
HEMATOCRIT: 34.3 % — AB (ref 39.0–52.0)
Hemoglobin: 10.8 g/dL — ABNORMAL LOW (ref 13.0–17.0)
Lymphocytes Relative: 5 %
Lymphs Abs: 0.6 10*3/uL — ABNORMAL LOW (ref 0.7–4.0)
MCH: 28.4 pg (ref 26.0–34.0)
MCHC: 31.5 g/dL (ref 30.0–36.0)
MCV: 90.3 fL (ref 78.0–100.0)
MONO ABS: 0.4 10*3/uL (ref 0.1–1.0)
Monocytes Relative: 3 %
NEUTROS ABS: 9.4 10*3/uL — AB (ref 1.7–7.7)
Neutrophils Relative %: 92 %
PLATELETS: 198 10*3/uL (ref 150–400)
RBC: 3.8 MIL/uL — ABNORMAL LOW (ref 4.22–5.81)
RDW: 17.7 % — AB (ref 11.5–15.5)
WBC: 10.3 10*3/uL (ref 4.0–10.5)

## 2017-02-08 LAB — URINALYSIS, ROUTINE W REFLEX MICROSCOPIC
BILIRUBIN URINE: NEGATIVE
Bacteria, UA: NONE SEEN
Glucose, UA: NEGATIVE mg/dL
KETONES UR: 5 mg/dL — AB
Leukocytes, UA: NEGATIVE
Nitrite: NEGATIVE
Protein, ur: NEGATIVE mg/dL
Specific Gravity, Urine: 1.012 (ref 1.005–1.030)
pH: 5 (ref 5.0–8.0)

## 2017-02-08 LAB — PROTIME-INR
INR: 1.12
Prothrombin Time: 14.3 seconds (ref 11.4–15.2)

## 2017-02-08 LAB — STREP PNEUMONIAE URINARY ANTIGEN: STREP PNEUMO URINARY ANTIGEN: NEGATIVE

## 2017-02-08 LAB — PROCALCITONIN: PROCALCITONIN: 0.17 ng/mL

## 2017-02-08 LAB — APTT: APTT: 33 s (ref 24–36)

## 2017-02-08 LAB — I-STAT CG4 LACTIC ACID, ED: LACTIC ACID, VENOUS: 1.25 mmol/L (ref 0.5–1.9)

## 2017-02-08 MED ORDER — ATORVASTATIN CALCIUM 10 MG PO TABS
10.0000 mg | ORAL_TABLET | Freq: Every day | ORAL | Status: DC
  Administered 2017-02-09 – 2017-02-12 (×4): 10 mg via ORAL
  Filled 2017-02-08 (×4): qty 1

## 2017-02-08 MED ORDER — ENSURE ENLIVE PO LIQD
237.0000 mL | Freq: Three times a day (TID) | ORAL | Status: DC
  Administered 2017-02-11: 237 mL via ORAL

## 2017-02-08 MED ORDER — ENOXAPARIN SODIUM 40 MG/0.4ML ~~LOC~~ SOLN: 40.0000 mg | SUBCUTANEOUS | Status: DC

## 2017-02-08 MED ORDER — PANTOPRAZOLE SODIUM 40 MG IV SOLR
40.0000 mg | Freq: Every day | INTRAVENOUS | Status: DC
  Administered 2017-02-08 – 2017-02-09 (×2): 40 mg via INTRAVENOUS
  Filled 2017-02-08 (×2): qty 40

## 2017-02-08 MED ORDER — AMLODIPINE BESYLATE 10 MG PO TABS
10.0000 mg | ORAL_TABLET | Freq: Every day | ORAL | Status: DC
  Administered 2017-02-09 – 2017-02-12 (×4): 10 mg via ORAL
  Filled 2017-02-08 (×4): qty 1

## 2017-02-08 MED ORDER — GUAIFENESIN ER 600 MG PO TB12
600.0000 mg | ORAL_TABLET | Freq: Two times a day (BID) | ORAL | Status: DC | PRN
Start: 2017-02-08 — End: 2017-02-12

## 2017-02-08 MED ORDER — RESOURCE THICKENUP CLEAR PO POWD
Freq: Three times a day (TID) | ORAL | Status: DC
  Administered 2017-02-08 – 2017-02-09 (×3): via ORAL
  Filled 2017-02-08: qty 125

## 2017-02-08 MED ORDER — LOSARTAN POTASSIUM 50 MG PO TABS
50.0000 mg | ORAL_TABLET | Freq: Every day | ORAL | Status: DC
  Administered 2017-02-09 – 2017-02-12 (×4): 50 mg via ORAL
  Filled 2017-02-08 (×4): qty 1

## 2017-02-08 MED ORDER — PREDNISONE 20 MG PO TABS
40.0000 mg | ORAL_TABLET | Freq: Every day | ORAL | Status: DC
  Administered 2017-02-09 – 2017-02-12 (×4): 40 mg via ORAL
  Filled 2017-02-08 (×5): qty 2

## 2017-02-08 MED ORDER — PANTOPRAZOLE SODIUM 40 MG PO TBEC: 40.0000 mg | DELAYED_RELEASE_TABLET | Freq: Every day | ORAL | Status: DC

## 2017-02-08 MED ORDER — POTASSIUM CHLORIDE IN NACL 40-0.9 MEQ/L-% IV SOLN
INTRAVENOUS | Status: DC
Start: 2017-02-08 — End: 2017-02-09
  Administered 2017-02-08 – 2017-02-09 (×3): 100 mL/h via INTRAVENOUS
  Filled 2017-02-08 (×4): qty 1000

## 2017-02-08 MED ORDER — SERTRALINE HCL 100 MG PO TABS
100.0000 mg | ORAL_TABLET | Freq: Every day | ORAL | Status: DC
  Administered 2017-02-09 – 2017-02-12 (×4): 100 mg via ORAL
  Filled 2017-02-08 (×4): qty 1

## 2017-02-08 MED ORDER — SODIUM CHLORIDE 0.9 % IV SOLN: INTRAVENOUS | Status: DC

## 2017-02-08 MED ORDER — COLLAGENASE 250 UNIT/GM EX OINT
TOPICAL_OINTMENT | Freq: Every day | CUTANEOUS | Status: DC
  Administered 2017-02-08 – 2017-02-12 (×4): via TOPICAL
  Filled 2017-02-08: qty 90

## 2017-02-08 MED ORDER — ENOXAPARIN SODIUM 30 MG/0.3ML ~~LOC~~ SOLN
30.0000 mg | SUBCUTANEOUS | Status: DC
  Administered 2017-02-08 – 2017-02-11 (×4): 30 mg via SUBCUTANEOUS
  Filled 2017-02-08 (×4): qty 0.3

## 2017-02-08 MED ORDER — IPRATROPIUM-ALBUTEROL 0.5-2.5 (3) MG/3ML IN SOLN
3.0000 mL | Freq: Three times a day (TID) | RESPIRATORY_TRACT | Status: DC
  Administered 2017-02-08 – 2017-02-12 (×12): 3 mL via RESPIRATORY_TRACT
  Filled 2017-02-08 (×12): qty 3

## 2017-02-08 MED ORDER — VANCOMYCIN HCL IN DEXTROSE 750-5 MG/150ML-% IV SOLN
750.0000 mg | INTRAVENOUS | Status: DC
  Administered 2017-02-09 – 2017-02-10 (×2): 750 mg via INTRAVENOUS
  Filled 2017-02-08 (×2): qty 150

## 2017-02-08 MED ORDER — ASPIRIN EC 81 MG PO TBEC
81.0000 mg | DELAYED_RELEASE_TABLET | Freq: Every day | ORAL | Status: DC
  Administered 2017-02-09 – 2017-02-12 (×4): 81 mg via ORAL
  Filled 2017-02-08 (×4): qty 1

## 2017-02-08 MED ORDER — CEFEPIME HCL 1 G IJ SOLR
1.0000 g | Freq: Three times a day (TID) | INTRAMUSCULAR | Status: DC
  Administered 2017-02-08 – 2017-02-11 (×10): 1 g via INTRAVENOUS
  Filled 2017-02-08 (×11): qty 1

## 2017-02-08 MED ORDER — CLONIDINE HCL 0.1 MG PO TABS
0.1000 mg | ORAL_TABLET | Freq: Three times a day (TID) | ORAL | Status: DC
  Administered 2017-02-09 – 2017-02-12 (×10): 0.1 mg via ORAL
  Filled 2017-02-08 (×10): qty 1

## 2017-02-08 MED ORDER — COLLAGENASE 250 UNIT/GM EX OINT: TOPICAL_OINTMENT | Freq: Every day | CUTANEOUS | Status: DC | PRN

## 2017-02-08 NOTE — Consult Note (Signed)
[facility] Nurse wound consult note Reason for Consult: bilateral ischial tuberosity vs inguinal wounds.  Left>right.  Difficult to classify anatomy due to severe contractures Wound type:Pressure Pressure Injury POA: Yes Measurement: Right ischial/inguinal 5cm x 2.5cm with 80% of wound depth obscured by the presence of eschar.  20% pink.  No drainage Left ischial tuberosity/inguinal 2.5cm x 1.5cm x 0.1cm rink, moist. scant serous drainge. Wound bed:As described above Drainage (amount, consistency, odor) As described above Periwound:Intact Dressing procedure/placement/frequency: I will provide a mattress replacement with low air loss feature for prevention, also a sacral prophylactic dressing.  Topical wound care orders are placed and they are conservative, patient is incontinence of stool and dressings will become easily soiled when this occurs due to proximity to anus. [place] nursing team will not follow, but will remain available to this patient, the nursing and medical teams.  Please re-consult if needed. Thanks, Maudie Flakes, MSN, RN, [place], Arther Abbott  Pager# [phone]

## 2017-02-08 NOTE — Progress Notes (Signed)
Patient ID: [name], male   DOB: 01/09/1951, 66 y.o.   MRN: [id number]  PROGRESS NOTE    Jonnatan Hanners  XFG:[id number] DOB: 1950/05/25 DOA: 02/07/2017  PCP: Josetta Huddle, MD   Brief Narrative:   66 year old male with history of childhood polio, post polio syndrome, history of right basal ganglia and thalamic bleeding, bedbound with contractures who presented with worsening shortness of breath and fever. Patient was hospitalized from 01/15/2017 through 01/20/2017 for acute respiratory failure with hypoxia secondary to aspiration pneumonia and streptococcal pneumonia. Chest x-ray this admission showed left lung base consolidation suspicious for pneumonia. Patient started on empiric vancomycin and cefepime.  Assessment & Plan:   Principal Problem:   Sepsis / HCAP (healthcare-associated pneumonia) / leukocytosis - Sepsis criteria met on the admission with fever, tachycardia, tachypnea, leukocytosis - Chest x-ray on admission showed left lung base consolidation suspicious for pneumonia - Considering patient has been recently hospitalized, patient started on vancomycin and cefepime for treatment of healthcare associated pneumonia - Blood cultures are pending - Strep pneumonia pending  Active Problems:   Post-poliomyelitis muscular atrophy - Contracted UE, LE    Hypokalemia - Due to sepsis - Supplemented - Follow-up BMP tomorrow morning    Severe protein-calorie malnutrition ([place]) - In the context of chronic illness - Nutrition consulted     Pressure ulcer of coccygeal region, stage 3 ([place]) - WOC consulted  - Appreciate their assessment   DVT prophylaxis: SCD's Code Status: full code  Family Communication: No family at the bedside Disposition Plan: Not stable for discharge, continue treating for pneumonia   Consultants:   None  Procedures:   None  Antimicrobials:   Vancomycin and cefepime 02/07/2017 -->   Subjective: No overnight events.  Objective: Vitals:     02/08/17 0640 02/08/17 0647 02/08/17 0919 02/08/17 1127  BP:  (!) 157/87 (!) 141/97 (!) 146/89  Pulse:  86 81 89  Resp:  17 19 (!) 23  Temp: 98.2 F (36.8 C)     TempSrc:      SpO2:  100% 99% 94%  Weight:        Intake/Output Summary (Last 24 hours) at 02/08/17 1207 Last data filed at 02/08/17 9678  Gross per 24 hour  Intake             3550 ml  Output                0 ml  Net             3550 ml   Filed Weights   02/08/17 0358  Weight: 39.9 kg (88 lb)    Examination:  General exam: Appears calm and comfortable  Respiratory system: Diminished breath sounds, mild wheezing in upper lung lobes  Cardiovascular system: S1 & S2 heard, RRR. Gastrointestinal system: Abdomen is nondistended, soft and nontender. No organomegaly or masses felt. Normal bowel sounds heard. Central nervous system: Alert, no focal deficit  Extremities: Contracted LE, UE Skin: warm, dry  Psychiatry: Not agitated or restless    Data Reviewed: I have personally reviewed following labs and imaging studies  CBC:  Recent Labs Lab 02/07/17 2150  WBC 11.2*  NEUTROABS 9.4*  HGB 11.1*  HCT 34.9*  MCV 90.6  PLT 938   Basic Metabolic Panel:  Recent Labs Lab 02/07/17 2150  NA 151*  K 2.1*  CL 116*  CO2 22  GLUCOSE 120*  BUN 26*  CREATININE 0.79  CALCIUM 7.8*   GFR: Estimated Creatinine Clearance: 52 mL/min (by  C-G formula based on SCr of 0.79 mg/dL). Liver Function Tests:  Recent Labs Lab 02/07/17 2150  AST 15  ALT 10*  ALKPHOS 56  BILITOT 1.6*  PROT 5.8*  ALBUMIN 2.4*   No results for input(s): LIPASE, AMYLASE in the last 168 hours. No results for input(s): AMMONIA in the last 168 hours. Coagulation Profile: No results for input(s): INR, PROTIME in the last 168 hours. Cardiac Enzymes: No results for input(s): CKTOTAL, CKMB, CKMBINDEX, TROPONINI in the last 168 hours. BNP (last 3 results) No results for input(s): PROBNP in the last 8760 hours. HbA1C: No results for  input(s): HGBA1C in the last 72 hours. CBG: No results for input(s): GLUCAP in the last 168 hours. Lipid Profile: No results for input(s): CHOL, HDL, LDLCALC, TRIG, CHOLHDL, LDLDIRECT in the last 72 hours. Thyroid Function Tests: No results for input(s): TSH, T4TOTAL, FREET4, T3FREE, THYROIDAB in the last 72 hours. Anemia Panel: No results for input(s): VITAMINB12, FOLATE, FERRITIN, TIBC, IRON, RETICCTPCT in the last 72 hours. Urine analysis:    Component Value Date/Time   COLORURINE YELLOW 02/08/2017 0520   APPEARANCEUR CLEAR 02/08/2017 0520   LABSPEC 1.012 02/08/2017 0520   PHURINE 5.0 02/08/2017 0520   GLUCOSEU NEGATIVE 02/08/2017 0520   HGBUR SMALL (A) 02/08/2017 0520   BILIRUBINUR NEGATIVE 02/08/2017 0520   KETONESUR 5 (A) 02/08/2017 0520   PROTEINUR NEGATIVE 02/08/2017 0520   UROBILINOGEN 1.0 12/17/2013 1152   NITRITE NEGATIVE 02/08/2017 0520   LEUKOCYTESUR NEGATIVE 02/08/2017 0520   Sepsis Labs: '@LABRCNTIP' (procalcitonin:4,lacticidven:4)   )No results found for this or any previous visit (from the past 240 hour(s)).    Radiology Studies: Dg Chest Port 1 View Result Date: 02/07/2017 Left lung base consolidation suspicious for pneumonia.     Scheduled Meds: . predniSONE  40 mg Oral Q breakfast  . RESOURCE THICKENUP CLEAR   Oral TID AC   Continuous Infusions: . 0.9 % NaCl with KCl 40 mEq / L 100 mL/hr (02/08/17 1125)  . ceFEPime (MAXIPIME) IV Stopped (02/08/17 1324)  . vancomycin       LOS: 0 days    Time spent: 25 minutes  Greater than 50% of the time spent on counseling and coordinating the care.   Leisa Lenz, MD [facility] Pager [phone]  If 7PM-7AM, please contact night-coverage [email or web address] Password [email or web address] 02/08/2017, 12:07 PM

## 2017-02-08 NOTE — Progress Notes (Signed)
[facility]-GIP RN Visit@ 13:00  This is a related and covered GIP admission of Date with [facility] diagnosis of Cerebrovascular disease . Code status: full code. Family activated EMS for worsening SOB and fever. Hospice was not notified prior to calling EMS.  Visited with patient and his daughter at bedside. Patient did not respond to me but denied pain when asked by daughter.  No SOB breath noticed,  PIVs left and right AC, foley catheter with clear yellow urine.   Medications:  NS with KCL 43mEq/ liter @100ml /hr, Maxipime 1gm IVPB Q8hrs, Vancomycin 750mg  IVPB QD. No PRN medication given.  Norvasc 10mg  QD, ASA 81mg  QD, Lipitor 10mg  QD, Clonidine 0.1mg  TID, Lovenox 30mg  QD, DuoNeb 0.5-2.5mg  TID, Losartan 50mg  QD, Protonix 40mg  QD, prednisone 40mg  QD, resource thickenup clear TID before meals, sertraline 100mg  QD.   Dr. Tomasa Hosteller  and Dr. Inda Merlin  notified of admission. Transfer Summary and medication list placed on shadow chart.   If patient is discharged and needs ambulance transport, please call [facility] for transportation as pt is an existing [facility] patient.  Call [facility] with any hospice related questions.  Will continue to follow while hospitalized and anticipate discharge needs.   Thank you  [facility] Liaison  [phone]    All Hospital Liaisons are on AMION

## 2017-02-08 NOTE — Progress Notes (Signed)
Initial Nutrition Assessment  DOCUMENTATION CODES:   Underweight, Non-severe (moderate) malnutrition in context of chronic illness  INTERVENTION:    Ensure Enlive po TID, each supplement provides 350 kcal and 20 grams of protein  NUTRITION DIAGNOSIS:   Moderate Malnutrition related to chronic illness (post polio syndrome) as evidenced by severe fat depletion, severe muscle depletion.  GOAL:   Patient will meet greater than or equal to 90% of their needs  MONITOR:   PO intake, Supplement acceptance, Weight trends, Labs, Diet advancement  REASON FOR ASSESSMENT:   Consult Assessment of nutrition requirement/status  ASSESSMENT:   Pt with PMH significant for childhood polio with post polio syndrome, CVA, HTN, HLD, and is bed bound with contractures. Recently hospitalized 10/6-10-11 for acute respiratory failure secondary to aspiration PNA. Presents this admission with sepsis/HCAP.   Pt unable to speak upon assessment. No family at bedside to answer questions. Spoke with RN who reports pt comes from home and lives with family. RN spoke with family who reports pt is only consuming soup with thickener. Reports pt has hard time swallowing but family is persistent with feeding.  SLP saw pt during 10/6 admission, recommended DYS 1 diet.   Weight records are limited in weight history. RD noted on 10/8- pt has lost 27 lbs (23% body weight) since 11/19/14. This is not significant for time frame if weight was truly lost over this time frame.  Nutrition-Focused physical exam completed. Findings are severe fat depletion, severe muscle depletion, and mild edema in right upper extremity. Suspect muscle atrophy and fat depletions are related to poor PO intake as a result of swallowing issues combined with pt being chronically bed bound.   Medications reviewed and include: prednisone, NS @ 75 ml/hr, IV abx, NaCl with KCl @ 100 ml/hr Labs reviewed: Na 151 (H) K 3.1 (L) Cl 121 (H)   NUTRITION -  FOCUSED PHYSICAL EXAM:    Most Recent Value  Orbital Region  Severe depletion  Upper Arm Region  Severe depletion  Thoracic and Lumbar Region  Unable to assess  Buccal Region  Severe depletion  Temple Region  Severe depletion  Clavicle Bone Region  Severe depletion  Clavicle and Acromion Bone Region  Severe depletion  Scapular Bone Region  Unable to assess  Dorsal Hand  Moderate depletion  Patellar Region  Severe depletion  Anterior Thigh Region  Severe depletion  Posterior Calf Region  Severe depletion  Edema (RD Assessment)  Mild  Hair  Reviewed  Eyes  Reviewed  Mouth  Unable to assess  Skin  Reviewed  Nails  Reviewed      Diet Order:  DIET - DYS 1 Room service appropriate? Yes; Fluid consistency: Nectar Thick  EDUCATION NEEDS:   Not appropriate for education at this time  Skin:  Skin Assessment: Skin Integrity Issues: Skin Integrity Issues:: Stage I, Stage II Stage I: buttocks Stage II: coccyx  Last BM:  PTA  Height:   Ht Readings from Last 1 Encounters:  01/15/17 5\' 2"  (1.575 m)    Weight:   Wt Readings from Last 1 Encounters:  02/08/17 88 lb (39.9 kg)    Ideal Body Weight:  53.6 kg  BMI:  Body mass index is 16.1 kg/m.  Estimated Nutritional Needs:   Kcal:  1400-1600 (35-40 kcal/kg)  Protein:  68-76 grams (1.7-1.9 g/kg)  Fluid:  >1.4 L/day    Mariana Single RD, LDN Clinical Nutrition Pager # - [phone]

## 2017-02-08 NOTE — Progress Notes (Signed)
Pharmacy Antibiotic Note  Kingsley Farace is a 66 y.o. male with SOB and fever admitted on 02/07/2017 with pneumonia.  Pharmacy has been consulted for cefepime and vancomycin dosing.  Plan: Cefepime 1 Gm IV q8h Vancomycin 1 Gm x1 then 750 mg IV q24h VT=15-20 mg/L F/u scr/cultures/levels  Weight: 88 lb (39.9 kg)  Temp (24hrs), Avg:99 F (37.2 C), Min:98 F (36.7 C), Max:100.9 F (38.3 C)   Recent Labs Lab 02/07/17 2150 02/07/17 2239 02/08/17 0008  WBC 11.2*  --   --   CREATININE 0.79  --   --   LATICACIDVEN  --  1.37 1.25    Estimated Creatinine Clearance: 52 mL/min (by C-G formula based on SCr of 0.79 mg/dL).    No Known Allergies  Antimicrobials this admission: 10/29 zosyn >> x1 ED 10/30 cefepime >>  10/29 vancomycin >>  Dose adjustments this admission:   Microbiology results:  BCx:   UCx:    Sputum:    MRSA PCR:   Thank you for allowing pharmacy to be a part of this patient's care.  Dorrene German 02/08/2017 4:09 AM

## 2017-02-09 DIAGNOSIS — I10 Essential (primary) hypertension: Secondary | ICD-10-CM

## 2017-02-09 DIAGNOSIS — E870 Hyperosmolality and hypernatremia: Secondary | ICD-10-CM

## 2017-02-09 DIAGNOSIS — E43 Unspecified severe protein-calorie malnutrition: Secondary | ICD-10-CM

## 2017-02-09 LAB — MRSA PCR SCREENING: MRSA by PCR: NEGATIVE

## 2017-02-09 LAB — URINE CULTURE: Culture: NO GROWTH

## 2017-02-09 LAB — BASIC METABOLIC PANEL
Anion gap: 8 (ref 5–15)
BUN: 12 mg/dL (ref 6–20)
CHLORIDE: 119 mmol/L — AB (ref 101–111)
CO2: 23 mmol/L (ref 22–32)
CREATININE: 0.34 mg/dL — AB (ref 0.61–1.24)
Calcium: 7.8 mg/dL — ABNORMAL LOW (ref 8.9–10.3)
GFR calc Af Amer: >60 mL/min (ref >60)
GFR calc non Af Amer: >60 mL/min (ref >60)
GLUCOSE: 89 mg/dL (ref 65–99)
Potassium: 3.9 mmol/L (ref 3.5–5.1)
SODIUM: 150 mmol/L — AB (ref 135–145)

## 2017-02-09 LAB — BLOOD CULTURE ID PANEL (REFLEXED)
Acinetobacter baumannii: NOT DETECTED
CANDIDA PARAPSILOSIS: NOT DETECTED
Candida albicans: NOT DETECTED
Candida glabrata: NOT DETECTED
Candida krusei: NOT DETECTED
Candida tropicalis: NOT DETECTED
Enterobacter cloacae complex: NOT DETECTED
Enterobacteriaceae species: NOT DETECTED
Enterococcus species: NOT DETECTED
Escherichia coli: NOT DETECTED
HAEMOPHILUS INFLUENZAE: NOT DETECTED
KLEBSIELLA PNEUMONIAE: NOT DETECTED
Klebsiella oxytoca: NOT DETECTED
Listeria monocytogenes: NOT DETECTED
METHICILLIN RESISTANCE: DETECTED — AB
NEISSERIA MENINGITIDIS: NOT DETECTED
PROTEUS SPECIES: NOT DETECTED
PSEUDOMONAS AERUGINOSA: NOT DETECTED
SERRATIA MARCESCENS: NOT DETECTED
STAPHYLOCOCCUS AUREUS BCID: NOT DETECTED
STREPTOCOCCUS PNEUMONIAE: NOT DETECTED
Staphylococcus species: DETECTED — AB
Streptococcus agalactiae: NOT DETECTED
Streptococcus pyogenes: NOT DETECTED
Streptococcus species: NOT DETECTED

## 2017-02-09 LAB — CBC
HCT: 37.2 % — ABNORMAL LOW (ref 39.0–52.0)
Hemoglobin: 11.7 g/dL — ABNORMAL LOW (ref 13.0–17.0)
MCH: 28.7 pg (ref 26.0–34.0)
MCHC: 31.5 g/dL (ref 30.0–36.0)
MCV: 91.2 fL (ref 78.0–100.0)
PLATELETS: 179 10*3/uL (ref 150–400)
RBC: 4.08 MIL/uL — ABNORMAL LOW (ref 4.22–5.81)
RDW: 17.9 % — AB (ref 11.5–15.5)
WBC: 8.1 10*3/uL (ref 4.0–10.5)

## 2017-02-09 MED ORDER — POTASSIUM CL IN DEXTROSE 5% 20 MEQ/L IV SOLN
20.0000 meq | INTRAVENOUS | Status: DC
  Administered 2017-02-09 – 2017-02-11 (×4): 20 meq via INTRAVENOUS
  Filled 2017-02-09 (×5): qty 1000

## 2017-02-09 MED ORDER — FLUCONAZOLE 100 MG PO TABS
100.0000 mg | ORAL_TABLET | Freq: Every day | ORAL | Status: DC
  Administered 2017-02-09 – 2017-02-12 (×4): 100 mg via ORAL
  Filled 2017-02-09 (×4): qty 1

## 2017-02-09 NOTE — Progress Notes (Signed)
[facility] PROGRESS NOTE  Santhosh Gulino ENI:[id number] DOB: 03-28-1951 DOA: 02/07/2017  PCP: Josetta Huddle, MD  Brief History/Interval Summary: 66 year old male of [place] origin with a past medical history of polio, postpolio syndrome, history of stroke who is bedbound with contractures of his extremity who is under hospice care although full code presented after family called EMS for shortness of breath and fever.  Patient found to have pneumonia.  He was hospitalized for further management.  Reason for Visit: Healthcare associated pneumonia  Consultants: None  Procedures: None  Antibiotics: Vancomycin and cefepime Diflucan   Subjective/Interval History: Patient not very communicative.  Interpreter was utilized and patient denies any pain.  Does not appear to be in any distress.  ROS: Unable to do  Objective:  Vital Signs  Vitals:   02/08/17 2019 02/09/17 0559 02/09/17 0832 02/09/17 1358  BP: (!) 154/88 (!) 157/88 (!) 166/87 137/86  Pulse: 76 73 66 73  Resp: 18 17  18   Temp: (!) 97.2 F (36.2 C) (!) 96.6 F (35.9 C)  98.1 F (36.7 C)  TempSrc: Axillary Axillary  Oral  SpO2: 98%  96% 100%  Weight:        Intake/Output Summary (Last 24 hours) at 02/09/17 1416 Last data filed at 02/09/17 1246  Gross per 24 hour  Intake              460 ml  Output              850 ml  Net             -390 ml   Filed Weights   02/08/17 0358  Weight: 39.9 kg (88 lb)    General appearance: alert, cooperative and no distress Resp: Diminished air entry at the bases.  No definite wheezing rales or rhonchi Cardio: regular rate and rhythm, S1, S2 normal, no murmur, click, rub or gallop GI: soft, non-tender; bowel sounds normal; no masses,  no organomegaly Extremities: Contracted lower extremities   Lab Results:  Data Reviewed: I have personally reviewed following labs and imaging studies  CBC:  Recent Labs Lab 02/07/17 2150 02/08/17 1336 02/09/17 0605  WBC  11.2* 10.3 8.1  NEUTROABS 9.4* 9.4*  --   HGB 11.1* 10.8* 11.7*  HCT 34.9* 34.3* 37.2*  MCV 90.6 90.3 91.2  PLT 225 198 614    Basic Metabolic Panel:  Recent Labs Lab 02/07/17 2150 02/08/17 1336 02/09/17 0605  NA 151* 151* 150*  K 2.1* 3.1* 3.9  CL 116* 121* 119*  CO2 22 22 23   GLUCOSE 120* 149* 89  BUN 26* 18 12  CREATININE 0.79 0.48* 0.34*  CALCIUM 7.8* 7.6* 7.8*    GFR: Estimated Creatinine Clearance: 52 mL/min (A) (by C-G formula based on SCr of 0.34 mg/dL (L)).  Liver Function Tests:  Recent Labs Lab 02/07/17 2150  AST 15  ALT 10*  ALKPHOS 56  BILITOT 1.6*  PROT 5.8*  ALBUMIN 2.4*    Coagulation Profile:  Recent Labs Lab 02/08/17 1336  INR 1.12     Recent Results (from the past 240 hour(s))  Blood Culture (routine x 2)     Status: None (Preliminary result)   Collection Time: 02/07/17  9:50 PM  Result Value Ref Range Status   Specimen Description BLOOD RIGHT HAND  Final   Special Requests   Final    BOTTLES DRAWN AEROBIC AND ANAEROBIC Blood Culture adequate volume   Culture  Setup Time   Final    GRAM  POSITIVE COCCI IN CLUSTERS ANAEROBIC BOTTLE ONLY CRITICAL RESULT CALLED TO, READ BACK BY AND VERIFIED WITH: B.GREEN PHARMD 02/09/17 0541 L.CHAMPION Performed at [facility], [place] [address], [place], [place] [address]    Culture GRAM POSITIVE COCCI IN CLUSTERS  Final   Report Status PENDING  Incomplete  Blood Culture ID Panel (Reflexed)     Status: Abnormal   Collection Time: 02/07/17  9:50 PM  Result Value Ref Range Status   Enterococcus species NOT DETECTED NOT DETECTED Final   Listeria monocytogenes NOT DETECTED NOT DETECTED Final   Staphylococcus species DETECTED (A) NOT DETECTED Final    Comment: Methicillin (oxacillin) resistant coagulase negative staphylococcus. Possible blood culture contaminant (unless isolated from more than one blood culture draw or clinical case suggests pathogenicity). No antibiotic treatment is indicated for  blood  culture contaminants. CRITICAL RESULT CALLED TO, READ BACK BY AND VERIFIED WITH: B.GREEN PHARMD 02/09/17 0541 L.CHAMPION    Staphylococcus aureus NOT DETECTED NOT DETECTED Final   Methicillin resistance DETECTED (A) NOT DETECTED Final    Comment: CRITICAL RESULT CALLED TO, READ BACK BY AND VERIFIED WITH: B.GREEN PHARMD 02/09/17 0541 L.CHAMPION    Streptococcus species NOT DETECTED NOT DETECTED Final   Streptococcus agalactiae NOT DETECTED NOT DETECTED Final   Streptococcus pneumoniae NOT DETECTED NOT DETECTED Final   Streptococcus pyogenes NOT DETECTED NOT DETECTED Final   Acinetobacter baumannii NOT DETECTED NOT DETECTED Final   Enterobacteriaceae species NOT DETECTED NOT DETECTED Final   Enterobacter cloacae complex NOT DETECTED NOT DETECTED Final   Escherichia coli NOT DETECTED NOT DETECTED Final   Klebsiella oxytoca NOT DETECTED NOT DETECTED Final   Klebsiella pneumoniae NOT DETECTED NOT DETECTED Final   Proteus species NOT DETECTED NOT DETECTED Final   Serratia marcescens NOT DETECTED NOT DETECTED Final   Haemophilus influenzae NOT DETECTED NOT DETECTED Final   Neisseria meningitidis NOT DETECTED NOT DETECTED Final   Pseudomonas aeruginosa NOT DETECTED NOT DETECTED Final   Candida albicans NOT DETECTED NOT DETECTED Final   Candida glabrata NOT DETECTED NOT DETECTED Final   Candida krusei NOT DETECTED NOT DETECTED Final   Candida parapsilosis NOT DETECTED NOT DETECTED Final   Candida tropicalis NOT DETECTED NOT DETECTED Final    Comment: Performed at [facility], [place] [address] [place], [place] [address]  Blood Culture (routine x 2)     Status: None (Preliminary result)   Collection Time: 02/07/17  9:55 PM  Result Value Ref Range Status   Specimen Description BLOOD LEFT ANTECUBITAL  Final   Special Requests   Final    BOTTLES DRAWN AEROBIC AND ANAEROBIC Blood Culture adequate volume   Culture  Setup Time   Final    GRAM POSITIVE COCCI IN CLUSTERS IN BOTH  AEROBIC AND ANAEROBIC BOTTLES CRITICAL VALUE NOTED.  VALUE IS CONSISTENT WITH PREVIOUSLY REPORTED AND CALLED VALUE. Performed at [facility], [place] [address] [place], [place] [address]    Culture GRAM POSITIVE COCCI IN CLUSTERS  Final   Report Status PENDING  Incomplete  Urine culture     Status: None   Collection Time: 02/08/17  5:20 AM  Result Value Ref Range Status   Specimen Description URINE, CLEAN CATCH  Final   Special Requests NONE  Final   Culture   Final    NO GROWTH Performed at [facility], [address] [place], [place] [address]    Report Status 02/09/2017 FINAL  Final  MRSA PCR Screening  Status: None   Collection Time: 02/09/17  8:27 AM  Result Value Ref Range Status   MRSA by PCR NEGATIVE NEGATIVE Final    Comment:        The GeneXpert MRSA Assay (FDA approved for NASAL specimens only), is one component of a comprehensive MRSA colonization surveillance program. It is not intended to diagnose MRSA infection nor to guide or monitor treatment for MRSA infections.       Radiology Studies: Dg Chest Port 1 View  Result Date: 02/07/2017 CLINICAL DATA:  66 y/o  M; diminished breathing starting yesterday. EXAM: PORTABLE CHEST 1 VIEW COMPARISON:  01/16/2017 chest radiograph. FINDINGS: Stable cardiac silhouette within normal limits given projection and technique. Consolidation in the left lung base. No pleural effusion or pneumothorax. Bones are unremarkable. IMPRESSION: Left lung base consolidation suspicious for pneumonia. Electronically Signed   By: Kristine Garbe M.D.   On: 02/07/2017 22:24     Medications:  Scheduled: . amLODipine  10 mg Oral Daily  . aspirin EC  81 mg Oral Daily  . atorvastatin  10 mg Oral Daily  . cloNIDine  0.1 mg Oral TID  . collagenase   Topical Daily  . enoxaparin (LOVENOX) injection  30 mg Subcutaneous Q24H  . feeding supplement (ENSURE ENLIVE)  237 mL Oral TID BM  . fluconazole  100 mg Oral Daily  .  ipratropium-albuterol  3 mL Nebulization TID  . losartan  50 mg Oral Daily  . pantoprazole (PROTONIX) IV  40 mg Intravenous QHS  . predniSONE  40 mg Oral Q breakfast  . RESOURCE THICKENUP CLEAR   Oral TID AC  . sertraline  100 mg Oral Daily   Continuous: . ceFEPime (MAXIPIME) IV Stopped (02/09/17 1138)  . dextrose 5 % with KCl 20 mEq / L 20 mEq (02/09/17 1107)  . vancomycin Stopped (02/09/17 0144)   YSA:[id number], guaiFENesin  Assessment/Plan:  Principal Problem:   HCAP (healthcare-associated pneumonia) Active Problems:   Post-poliomyelitis muscular atrophy   Essential hypertension   Sepsis ([place])   Hypokalemia   Severe protein-calorie malnutrition ([facility])   Pressure ulcer of coccygeal region, stage 3 ([place])    Healthcare associated pneumonia with sepsis Patient recently hospitalized for pneumonia and acute respiratory failure.  There could be a component of aspiration as well.  Speech therapy has been consulted.  Patient remains on broad-spectrum antibiotics and steroids.  Follow-up cultures.  Speech therapy raised concern for oral candidiasis.  Unlikely patient will be able to effectively use topical treatment.  We will give him Diflucan.  Bacteremia Noted on initial set of blood cultures.  Possibly contaminant.  Repeat blood cultures ordered.  Postpolio muscular atrophy Patient has contracted lower and upper extremities.  This is his baseline.  Under hospice care at home Hospice is aware of patient's admission.  He remains full code.  He would benefit from goals of care conversation at home.  He was seen by palliative medicine during his last hospitalization however they did not make much headway as family was not available.  Essential hypertension Patient is on multiple antihypertensive medications.  Continue.  Severe protein calorie malnutrition In the context of chronic illness.  Stage III pressure ulcer of the coccygeal region Wound care consulted.  Hypokalemia  and hypernatremia Potassium level has improved.  Sodium remains high.  We will change him to D5 water infusion.  Normocytic anemia No evidence for overt bleeding.  Hemoglobin is stable.  DVT Prophylaxis: Lovenox    Code Status: Full code Family Communication:  No family at bedside Disposition Plan: Management as outlined above.    LOS: 1 day   [facility] Pager [phone] 02/09/2017, 2:16 PM  If 7PM-7AM, please contact night-coverage at [email or web address], password [facility]

## 2017-02-09 NOTE — Progress Notes (Signed)
PHARMACY - PHYSICIAN COMMUNICATION CRITICAL VALUE ALERT - BLOOD CULTURE IDENTIFICATION (BCID)  Results for orders placed or performed during the hospital encounter of 02/07/17  Blood Culture ID Panel (Reflexed) (Collected: 02/07/2017  9:50 PM)  Result Value Ref Range   Enterococcus species NOT DETECTED NOT DETECTED   Listeria monocytogenes NOT DETECTED NOT DETECTED   Staphylococcus species DETECTED (A) NOT DETECTED   Staphylococcus aureus NOT DETECTED NOT DETECTED   Methicillin resistance DETECTED (A) NOT DETECTED   Streptococcus species NOT DETECTED NOT DETECTED   Streptococcus agalactiae NOT DETECTED NOT DETECTED   Streptococcus pneumoniae NOT DETECTED NOT DETECTED   Streptococcus pyogenes NOT DETECTED NOT DETECTED   Acinetobacter baumannii NOT DETECTED NOT DETECTED   Enterobacteriaceae species NOT DETECTED NOT DETECTED   Enterobacter cloacae complex NOT DETECTED NOT DETECTED   Escherichia coli NOT DETECTED NOT DETECTED   Klebsiella oxytoca NOT DETECTED NOT DETECTED   Klebsiella pneumoniae NOT DETECTED NOT DETECTED   Proteus species NOT DETECTED NOT DETECTED   Serratia marcescens NOT DETECTED NOT DETECTED   Haemophilus influenzae NOT DETECTED NOT DETECTED   Neisseria meningitidis NOT DETECTED NOT DETECTED   Pseudomonas aeruginosa NOT DETECTED NOT DETECTED   Candida albicans NOT DETECTED NOT DETECTED   Candida glabrata NOT DETECTED NOT DETECTED   Candida krusei NOT DETECTED NOT DETECTED   Candida parapsilosis NOT DETECTED NOT DETECTED   Candida tropicalis NOT DETECTED NOT DETECTED    Name of physician (or Provider) Contacted: Tylene Fantasia NP 2 bottles ( 1 from each set) > Gm + cocci in clusters > BCID Staph species with mecA +.  Changes to prescribed antibiotics required: On vancomycin  Dorrene German 02/09/2017  5:45 AM

## 2017-02-09 NOTE — Evaluation (Signed)
Clinical/Bedside Swallow Evaluation Patient Details  Name: [name] MRN: [id number] Date of Birth: 17-Nov-1950  Today's Date: 02/09/2017 Time: SLP Start Time (ACUTE ONLY): 1101 SLP Stop Time (ACUTE ONLY): 1146 SLP Time Calculation (min) (ACUTE ONLY): 45 min  Past Medical History:  Past Medical History:  Diagnosis Date  . Hypertension   . Polio   . Stroke [facility])    Past Surgical History:  Past Surgical History:  Procedure Laterality Date  . 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   Pt recently dc'd from hospital for pna and per recent CXR contiues with left lower lung consolidation/suspicious for pna.   CXR 10/7 left lower lobe collapse and patchy areas of left upper lobe infiltrate. Pt underwent MBS during last hospitalization with rec for dys1/nectar and per hospice representative [name], he has been receiving nectar thickened water/liquids prior to admit.     Assessment / Plan / Recommendation Clinical Impression  Clinical swallowing evaluation was completed using thin liquids, nectar and puree boluses *using interpreter Ipad.  Pt with copious viscous secretions retained in oral cavity with poor awareness.  Suspect oral hygeine and secretions may be contributing to pneumonia and advised RN to need for adequate oral care.  Asked RN to set up oral suction and provided extensive oral care.    The patient continues with symptoms of oropharyngeal dysphagia characterized by delayed oral transit- excessive oral holding, suspected delayed swallow trigger and multiple swallows.  He was only able to transit single small boluses of thin liquids.  All other consistencies required oral suctioning to remove.   SLP reviewed image loops from prior MBS and considering  thinner liquids with decreased pharyngeal residuals and thicker more difficult for oral transiting, recommend change diet to full liquids, thin with precautions to mitigate risk.  At this time, pt is sleepy and swallow impairment is worse than on prior clinical evaluation prohibiting him from adequate nutrition.    No family present at this time. SLP will follow up to help mitigate aspiration risk and educate family. Hopeful for improvement in swallow *even short term*  with medical improvement - note hospice following pt.  SLP Visit Diagnosis: Dysphagia, oropharyngeal phase (R13.12)    Aspiration Risk  Severe aspiration risk    Diet Recommendation     Medication Administration: Via alternative means Supervision: Full supervision/cueing for compensatory strategies Compensations: Slow rate;Small sips/bites;Other (Comment) (oral suction if pt does not swallow)    Other  Recommendations Oral Care Recommendations: Oral care QID   Follow up Recommendations Other (comment) (TBD)      Frequency and Duration min 2x/week  2 weeks       Prognosis Prognosis for Safe Diet Advancement: Guarded Barriers to Reach Goals: Time post onset      Swallow Study   General Date of Onset: 02/09/17 HPI: [name] a [place] 66 y.o.malewith medical history significant of HTN, HLD, childhood Polio, post Polio Syndrome and 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   Pt recently dc'd from hospital for pna and per recent CXR contiues with left lower lung consolidation/suspicious for pna.   CXR 10/7 left lower lobe collapse and patchy areas of left upper lobe infiltrate. Pt underwent MBS during last hospitalization  with rec for dys1/nectar and per hospice representative [name], he has been receiving nectar thickened water/liquids prior to admit.   Type of Study: Bedside Swallow Evaluation Diet Prior to this Study:  Nectar-thick liquids;Dysphagia 1 (puree) Temperature Spikes Noted: No Respiratory Status: Room air History of Recent Intubation: No Behavior/Cognition: Other (Comment);Lethargic/Drowsy (sleepy, dosing off during clinical evaluation) Oral Cavity Assessment: Excessive secretions;Dry;Other (comment) (viscous secretions, white coating on tongue) Oral Care Completed by SLP: Yes (asked RN to set up suction) Oral Cavity - Dentition: Poor condition;Adequate natural dentition Vision: Impaired for self-feeding Self-Feeding Abilities: Total assist Patient Positioning: Upright in bed Baseline Vocal Quality: Low vocal intensity;Other (comment) (pt only verbalizes yes and no) Volitional Cough: Weak;Cognitively unable to elicit (weak reflexive cough) Volitional Swallow: Unable to elicit    Oral/Motor/Sensory Function Overall Oral Motor/Sensory Function: Severe impairment (difficult to differentiate deficits as pt is grossly weak, has poor labial closure and limited lingual movement)   Ice Chips Ice chips: Not tested   Thin Liquid Thin Liquid: Impaired Presentation: Spoon;Cup Oral Phase Impairments: Reduced lingual movement/coordination Oral Phase Functional Implications: Prolonged oral transit;Oral holding;Oral residue Pharyngeal  Phase Impairments: Suspected delayed Swallow;Multiple swallows;Cough - Delayed    Nectar Thick Nectar Thick Liquid: Impaired Presentation: Cup;Spoon;Straw Oral Phase Impairments: Reduced labial seal;Reduced lingual movement/coordination Oral phase functional implications: Prolonged oral transit;Oral holding Pharyngeal Phase Impairments: Multiple swallows;Cough - Delayed   Honey Thick Honey Thick Liquid: Not tested   Puree Puree: Impaired Presentation: Spoon Oral Phase Impairments: Reduced labial seal;Reduced lingual movement/coordination Oral Phase Functional Implications: Prolonged oral transit;Oral residue;Oral holding (slp orally suctioned bolus from oral cavity due to  pt's lack of oral transiting)   Solid   GO   Solid: Not tested        Macario Golds 02/09/2017,12:26 PM  [name], [facility] SLP [phone]

## 2017-02-09 NOTE — Plan of Care (Signed)
Problem: Safety: Goal: Ability to remain free from injury will improve Outcome: Progressing Safety precautions; bed alarm, assistance with ADLs

## 2017-02-09 NOTE — Progress Notes (Signed)
Home Care SW made visit to Patient at the hospital.  He was getting a swallow study and was unable to tolerate thicker liquids.  Patient was drowsy during visit and did not communicate much during visit.  No family present.  SW will continue to follow and assist with discharge planning as needed.  Cecilio Asper, [place] [place]

## 2017-02-09 NOTE — Care Management Note (Signed)
Case Management Note  Patient Details  Name: [name] MRN: [id number] Date of Birth: 1950-05-18  Subjective/Objective:                  gip  Action/Plan: Date:  February 09, 2017 Chart reviewed for concurrent status and case management needs.  Will continue to follow patient progress.  Discharge Planning: following for needs  Expected discharge date: [id number]  Velva Harman, BSN, [place], [place]   Expected Discharge Date:   (unknown)               Expected Discharge Plan:  Home w Hospice Care  In-House Referral:     Discharge planning Services  CM Consult  Post Acute Care Choice:    Choice offered to:     DME Arranged:    DME Agency:     HH Arranged:    [place] Agency:     Status of Service:  In process, will continue to follow  If discussed at Long Length of Stay Meetings, dates discussed:    Additional Comments:  Darrell Cha, RN 02/09/2017, 9:09 AM

## 2017-02-09 NOTE — Progress Notes (Signed)
[facility] RN Visit at 0845am.  This is a related and covered GIP admission of 02/08/17 with [facility] diagnosis of Cerebrovascular disease, per Dr. Tomasa Hosteller. Code status: full code. Family activated EMS for worsening SOB and fever. Hospice was not notified prior to calling EMS.  Visited with patient at bedside.  Patient was alert, unable to determine orientation (patient wouldn't speak with me).  Patient would shake head yes or no to easy questions.  No family was at bedside to discuss patient.  Patient said "yes" that he was still SOB, but denies pain.  Patient was receiving breathing treatment and respiratory was at bedside during my visit.    Patient receiving:  Enoxaparin (LOVENOX) injection 30 mg, Dose 30 mg, Q24H via [place]; feeding supplement (ENSURE ENLIVE) liquid 237 mL, Dose 237 mL, TID via PO; ipratropium-albuterol (DUONEB) 0.5 -2.5 (3) MG/3ML nebulizer solution 3 mL, Dose 3 mL, TID via nebulization; prediniSONE (DETASONE) tablet 40 mg, Dose 40 mg, QD with breakfast via PO.  Continuous Medications:  ceFEPIme (MAXIPIME) 1 g in dextrose 5% 50 mL IVPB, Dose 1 g, Q8H via IV; dextrose 5% with KCl 20 mEq / L infusion, Rate 75 mL/hr, Dose 20 mEq, Continuous via IV; vancomycin (VANCOCIN) IVPB 750 MG/150 ml premix, Dose 750 mg, Q24H via IV.  PRN medications:  Has not received PRN medications today thus far.   If you have any hospice related questions or concerns, please feel free to contact me.   Thank you.  Edyth Gunnels, RN, BSN [facility] Liaison [phone]  All hospital liaisons are now on [place].

## 2017-02-10 DIAGNOSIS — G14 Postpolio syndrome: Secondary | ICD-10-CM

## 2017-02-10 DIAGNOSIS — J189 Pneumonia, unspecified organism: Secondary | ICD-10-CM

## 2017-02-10 DIAGNOSIS — L89003 Pressure ulcer of unspecified elbow, stage 3: Secondary | ICD-10-CM | POA: Diagnosis not present

## 2017-02-10 DIAGNOSIS — I10 Essential (primary) hypertension: Secondary | ICD-10-CM | POA: Diagnosis not present

## 2017-02-10 DIAGNOSIS — E46 Unspecified protein-calorie malnutrition: Secondary | ICD-10-CM | POA: Diagnosis not present

## 2017-02-10 DIAGNOSIS — I679 Cerebrovascular disease, unspecified: Secondary | ICD-10-CM | POA: Diagnosis not present

## 2017-02-10 DIAGNOSIS — I69959 Hemiplegia and hemiparesis following unspecified cerebrovascular disease affecting unspecified side: Secondary | ICD-10-CM | POA: Diagnosis not present

## 2017-02-10 DIAGNOSIS — E785 Hyperlipidemia, unspecified: Secondary | ICD-10-CM | POA: Diagnosis not present

## 2017-02-10 LAB — CBC
HEMATOCRIT: 34.7 % — AB (ref 39.0–52.0)
Hemoglobin: 10.9 g/dL — ABNORMAL LOW (ref 13.0–17.0)
MCH: 28.1 pg (ref 26.0–34.0)
MCHC: 31.4 g/dL (ref 30.0–36.0)
MCV: 89.4 fL (ref 78.0–100.0)
PLATELETS: 231 10*3/uL (ref 150–400)
RBC: 3.88 MIL/uL — ABNORMAL LOW (ref 4.22–5.81)
RDW: 17.4 % — AB (ref 11.5–15.5)
WBC: 9.9 10*3/uL (ref 4.0–10.5)

## 2017-02-10 LAB — BASIC METABOLIC PANEL
Anion gap: 7 (ref 5–15)
BUN: 9 mg/dL (ref 6–20)
CALCIUM: 7.8 mg/dL — AB (ref 8.9–10.3)
CO2: 23 mmol/L (ref 22–32)
CREATININE: 0.42 mg/dL — AB (ref 0.61–1.24)
Chloride: 110 mmol/L (ref 101–111)
GFR calc Af Amer: >60 mL/min (ref >60)
GLUCOSE: 95 mg/dL (ref 65–99)
Potassium: 3.8 mmol/L (ref 3.5–5.1)
Sodium: 140 mmol/L (ref 135–145)

## 2017-02-10 MED ORDER — PANTOPRAZOLE SODIUM 40 MG PO PACK
40.0000 mg | PACK | Freq: Every day | ORAL | Status: DC
Start: 2017-02-10 — End: 2017-02-12
  Administered 2017-02-10 – 2017-02-11 (×2): 40 mg via ORAL
  Filled 2017-02-10 (×2): qty 20

## 2017-02-10 MED ORDER — SODIUM CHLORIDE 0.9 % IV SOLN
500.0000 mg | INTRAVENOUS | Status: DC
  Administered 2017-02-10: 500 mg via INTRAVENOUS
  Filled 2017-02-10: qty 500

## 2017-02-10 NOTE — Plan of Care (Signed)
Problem: Safety: Goal: Ability to remain free from injury will improve Outcome: Progressing Safety precautions; bed alarm on

## 2017-02-10 NOTE — Progress Notes (Signed)
[facility] PROGRESS NOTE  [name] ZTI:[id number] DOB: [birth year] DOA: 02/07/2017  PCP: Josetta Huddle, MD  Brief History/Interval Summary: 66 year old male of [place] origin with a past medical history of polio, postpolio syndrome, history of stroke who is bedbound with contractures of his extremity who is under hospice care although full code presented after family called EMS for shortness of breath and fever.  Patient found to have pneumonia.  He was hospitalized for further management.  Reason for Visit: Healthcare associated pneumonia  Consultants: None  Procedures: None  Antibiotics: Vancomycin and cefepime Diflucan   Subjective/Interval History: Patient remains noncommunicative for the most part.  Does not appear to be in any distress or discomfort.    ROS: Unable to do  Objective:  Vital Signs  Vitals:   02/09/17 2021 02/09/17 2118 02/10/17 0343 02/10/17 0728  BP:  (!) 152/75 (!) 147/84   Pulse:   70 65  Resp:  18 18 16   Temp:  (!) 97.4 F (36.3 C) 97.6 F (36.4 C)   TempSrc:  Axillary Oral   SpO2: 98% 99% 93% 91%  Weight:        Intake/Output Summary (Last 24 hours) at 02/10/17 1026 Last data filed at 02/10/17 0948  Gross per 24 hour  Intake              710 ml  Output              550 ml  Net              160 ml   Filed Weights   02/08/17 0358  Weight: 39.9 kg (88 lb)    General appearance: Awake alert.  In no distress No oral lesions identified. Resp: Diminished air entry at the bases.  No definite wheezing rales or rhonchi. Cardio: S1-S2 is normal regular.  No S3-S4.  No rubs murmurs or bruit GI: Abdomen is soft.  Nontender nondistended.  Bowel sounds are present.  No masses or organomegaly Extremities: Contracted lower extremities   Lab Results:  Data Reviewed: I have personally reviewed following labs and imaging studies  CBC:  Recent Labs Lab 02/07/17 2150 02/08/17 1336 02/09/17 0605 02/10/17 0541  WBC 11.2* 10.3  8.1 9.9  NEUTROABS 9.4* 9.4*  --   --   HGB 11.1* 10.8* 11.7* 10.9*  HCT 34.9* 34.3* 37.2* 34.7*  MCV 90.6 90.3 91.2 89.4  PLT 225 198 179 825    Basic Metabolic Panel:  Recent Labs Lab 02/07/17 2150 02/08/17 1336 02/09/17 0605 02/10/17 0541  NA 151* 151* 150* 140  K 2.1* 3.1* 3.9 3.8  CL 116* 121* 119* 110  CO2 22 22 23 23   GLUCOSE 120* 149* 89 95  BUN 26* 18 12 9   CREATININE 0.79 0.48* 0.34* 0.42*  CALCIUM 7.8* 7.6* 7.8* 7.8*    GFR: Estimated Creatinine Clearance: 52 mL/min (A) (by C-G formula based on SCr of 0.42 mg/dL (L)).  Liver Function Tests:  Recent Labs Lab 02/07/17 2150  AST 15  ALT 10*  ALKPHOS 56  BILITOT 1.6*  PROT 5.8*  ALBUMIN 2.4*    Coagulation Profile:  Recent Labs Lab 02/08/17 1336  INR 1.12     Recent Results (from the past 240 hour(s))  Blood Culture (routine x 2)     Status: None (Preliminary result)   Collection Time: 02/07/17  9:50 PM  Result Value Ref Range Status   Specimen Description BLOOD RIGHT HAND  Final   Special Requests   Final  BOTTLES DRAWN AEROBIC AND ANAEROBIC Blood Culture adequate volume   Culture  Setup Time   Final    GRAM POSITIVE COCCI IN CLUSTERS ANAEROBIC BOTTLE ONLY CRITICAL RESULT CALLED TO, READ BACK BY AND VERIFIED WITH: B.GREEN PHARMD 02/09/17 0541 L.CHAMPION Performed at [facility], [place] [address], [place], [place] [address]    Culture GRAM POSITIVE COCCI IN CLUSTERS  Final   Report Status PENDING  Incomplete  Blood Culture ID Panel (Reflexed)     Status: Abnormal   Collection Time: 02/07/17  9:50 PM  Result Value Ref Range Status   Enterococcus species NOT DETECTED NOT DETECTED Final   Listeria monocytogenes NOT DETECTED NOT DETECTED Final   Staphylococcus species DETECTED (A) NOT DETECTED Final    Comment: Methicillin (oxacillin) resistant coagulase negative staphylococcus. Possible blood culture contaminant (unless isolated from more than one blood culture draw or clinical case  suggests pathogenicity). No antibiotic treatment is indicated for blood  culture contaminants. CRITICAL RESULT CALLED TO, READ BACK BY AND VERIFIED WITH: B.GREEN PHARMD 02/09/17 0541 L.CHAMPION    Staphylococcus aureus NOT DETECTED NOT DETECTED Final   Methicillin resistance DETECTED (A) NOT DETECTED Final    Comment: CRITICAL RESULT CALLED TO, READ BACK BY AND VERIFIED WITH: B.GREEN PHARMD 02/09/17 0541 L.CHAMPION    Streptococcus species NOT DETECTED NOT DETECTED Final   Streptococcus agalactiae NOT DETECTED NOT DETECTED Final   Streptococcus pneumoniae NOT DETECTED NOT DETECTED Final   Streptococcus pyogenes NOT DETECTED NOT DETECTED Final   Acinetobacter baumannii NOT DETECTED NOT DETECTED Final   Enterobacteriaceae species NOT DETECTED NOT DETECTED Final   Enterobacter cloacae complex NOT DETECTED NOT DETECTED Final   Escherichia coli NOT DETECTED NOT DETECTED Final   Klebsiella oxytoca NOT DETECTED NOT DETECTED Final   Klebsiella pneumoniae NOT DETECTED NOT DETECTED Final   Proteus species NOT DETECTED NOT DETECTED Final   Serratia marcescens NOT DETECTED NOT DETECTED Final   Haemophilus influenzae NOT DETECTED NOT DETECTED Final   Neisseria meningitidis NOT DETECTED NOT DETECTED Final   Pseudomonas aeruginosa NOT DETECTED NOT DETECTED Final   Candida albicans NOT DETECTED NOT DETECTED Final   Candida glabrata NOT DETECTED NOT DETECTED Final   Candida krusei NOT DETECTED NOT DETECTED Final   Candida parapsilosis NOT DETECTED NOT DETECTED Final   Candida tropicalis NOT DETECTED NOT DETECTED Final    Comment: Performed at [facility], [place] [address], [place], [place] [address]  Blood Culture (routine x 2)     Status: None (Preliminary result)   Collection Time: 02/07/17  9:55 PM  Result Value Ref Range Status   Specimen Description BLOOD LEFT ANTECUBITAL  Final   Special Requests   Final    BOTTLES DRAWN AEROBIC AND ANAEROBIC Blood Culture adequate volume   Culture   Setup Time   Final    GRAM POSITIVE COCCI IN CLUSTERS IN BOTH AEROBIC AND ANAEROBIC BOTTLES CRITICAL VALUE NOTED.  VALUE IS CONSISTENT WITH PREVIOUSLY REPORTED AND CALLED VALUE. Performed at [facility], [place] [address] [place], [place] [address]    Culture GRAM POSITIVE COCCI IN CLUSTERS  Final   Report Status PENDING  Incomplete  Urine culture     Status: None   Collection Time: 02/08/17  5:20 AM  Result Value Ref Range Status   Specimen Description URINE, CLEAN CATCH  Final   Special Requests NONE  Final   Culture   Final    NO GROWTH Performed at [facility], [address] [place], [place] [address]    Report Status 02/09/2017 FINAL  Final  MRSA PCR Screening     Status: None   Collection Time: 02/09/17  8:27 AM  Result Value Ref Range Status   MRSA by PCR NEGATIVE NEGATIVE Final    Comment:        The GeneXpert MRSA Assay (FDA approved for NASAL specimens only), is one component of a comprehensive MRSA colonization surveillance program. It is not intended to diagnose MRSA infection nor to guide or monitor treatment for MRSA infections.       Radiology Studies: No results found.   Medications:  Scheduled: . amLODipine  10 mg Oral Daily  . aspirin EC  81 mg Oral Daily  . atorvastatin  10 mg Oral Daily  . cloNIDine  0.1 mg Oral TID  . collagenase   Topical Daily  . enoxaparin (LOVENOX) injection  30 mg Subcutaneous Q24H  . feeding supplement (ENSURE ENLIVE)  237 mL Oral TID BM  . fluconazole  100 mg Oral Daily  . ipratropium-albuterol  3 mL Nebulization TID  . losartan  50 mg Oral Daily  . pantoprazole sodium  40 mg Oral QHS  . predniSONE  40 mg Oral Q breakfast  . RESOURCE THICKENUP CLEAR   Oral TID AC  . sertraline  100 mg Oral Daily   Continuous: . ceFEPime (MAXIPIME) IV 1 g (02/10/17 1021)  . dextrose 5 % with KCl 20 mEq / L 20 mEq (02/10/17 0310)  . [START ON 02/11/2017] vancomycin     [id number],  guaiFENesin  Assessment/Plan:  Principal Problem:   HCAP (healthcare-associated pneumonia) Active Problems:   Post-poliomyelitis muscular atrophy   Essential hypertension   Sepsis ([place])   Hypokalemia   Severe protein-calorie malnutrition (H[facility])   Pressure ulcer of coccygeal region, stage 3 ([place])    Healthcare associated pneumonia with sepsis/dysphagia Patient recently hospitalized for pneumonia and acute respiratory failure.  There could be a component of aspiration as well.  Speech therapy has seen the patient.  Full liquid diet was recommended.  Patient remains on broad-spectrum antibiotics and steroids.  Follow-up cultures.  Speech therapy raised concern for oral candidiasis.  Unlikely patient will be able to effectively use topical treatment.  Continue Diflucan.    Bacteremia/CNST Noted on initial set of blood cultures.  Possibly contaminant.  Repeat blood cultures pending.  Post-polio muscular atrophy Patient has contracted lower and upper extremities.  This is his baseline.  Hospice/goals of care Hospice is aware of patient's admission.  He remains full code.  Patient has also been seen by palliative medicine during previous hospitalization.  Hospice liaison is recommending that they see the patient again.  It appears that meeting has been scheduled for tomorrow.   Essential hypertension Patient is on multiple antihypertensive medications.  Continue to monitor.  Severe protein calorie malnutrition In the context of chronic illness.  Stage III pressure ulcer of the coccygeal region Wound care consulted.  Hypokalemia and hypernatremia Potassium level remains normal.  Sodium level has improved.  Continue D5 water for now.    Normocytic anemia No evidence for overt bleeding.  Hemoglobin is stable.  DVT Prophylaxis: Lovenox    Code Status: Full code Family Communication: No family at bedside Disposition Plan: Management as outlined above.    LOS: 2 days    [facility] Pager [phone] 02/10/2017, 10:26 AM  If 7PM-7AM, please contact night-coverage at [email or web address], password [facility]

## 2017-02-10 NOTE — Progress Notes (Signed)
PMT no charge note  Patient known to PMT service and the undersigned, he was seen in consultation in last hospitalization. At that time, with assistance from interpretor services, a family meeting was held with the patient's wife, son, daughter, along with [place] liaison, PMT provider and [facility] MD were all present. At that time, we had discussed about the patient's high risk of ongoing decline, his underlying conditions of stroke, bed bound status, debility and ongoing risk from recurrent asp pna. Code status and goals of care discussions were held, how ever, patient's wife and children wished to continue with full code/full scope, in addition to continuation of hospice services at home.   The patient is now re admitted to the hospital medicine service with health care associated PNA, sepsis, bacteremia, possible contaminant, severe protein calorie malnutrition.   A palliative consult has been requested again, for re discussing goals of care.   The patient is seen, resting in bed, appears weak, in no distress, there is no family in the room.   Call placed, discussed with [facility] liaison Amy Evans.   PLAN: PMT and [facility] to meet with patient, family, along with interpretor on 02-11-17 at 0900 Full note and final recommendations to follow.   Thank you for the consult.   Loistine Chance MD [facility] health palliative medicine team [phone]

## 2017-02-10 NOTE — Progress Notes (Addendum)
Speech Language Pathology Treatment: Dysphagia  Patient Details Name: [name] MRN: [id number] DOB: 02-Oct-1950 Today's Date: 02/10/2017 Time: 1630-1700 SLP Time Calculation (min) (ACUTE ONLY): 29 min  Assessment / Plan / Recommendation Clinical Impression  Pt today more consistently alert (used interpreter [name] [phone] for session).  Pt note, pt has bilateral strokes impacting parietal and temporal lobes in addition to right basal ganglia and thalamic bleeding - likely source of language and swallow deficits.    Pt today only answered approximately 20% of questions with yes/no response only.  Attempted to determine consistency pt preferred *thin vs thickened water* using verbal choice of 2, yes/no, eye gaze to preferred items without pt responses.  Often he turned his head away from this SLP.     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    Pt with excellent oral care today- RN praised for his efforts and reported pt requiring frequent oral care.  He continues with mild whitish coating on tongue but much better today.    SLP provided him with room temperature and cold ensure, jello, cold Sprite and thick/thin water boluses.  Pt required approximately 30 minutes to consume 11 small single boluses due to his extensive swallow delay.  He did demonstrate delayed minimal reflexive cough x2 during session - possible mild aspiration.  SLP did not clinically observe significant difference in tolerance of thin vs nectar and given pt with such minimal po intake - thin liquid may be more efficient and better tolerated. Pt is a laborious feeder with delays in swallow up to 20 seconds.   Per RN, pt only taking in small amount of Ensure today - delayed swallow with delayed cough observed.  RN reports pt indicates he does not desire more to eat/drink.    Anticipate pt will continue  with dehydration/malnutrition/aspiration risk due to his dysphagia -Mitigation strategies in place.    Recommend consider oral suction for home use to aid oral clearance/care and hygiene due to significant secretion retention.   Note family meeting with palliative planned for tomorrow. Marginal improvement today but continues with inadequate swallow ability/intake.      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   Pt recently dc'd from hospital for pna and per recent CXR contiues with left lower lung consolidation/suspicious for pna.   CXR 10/7 left lower lobe collapse and patchy areas of left upper lobe infiltrate. Pt underwent MBS during last hospitalization with rec for dys1/nectar and per hospice representative [name], he has been receiving nectar thickened water/liquids prior to admit.        SLP Plan  Continue with current plan of care       Recommendations  Diet recommendations: Thin liquid;Nectar-thick liquid;Other(comment) (continue full liquids) Medication Administration: Via alternative means (or crushed with icecream) Supervision: Full supervision/cueing for compensatory strategies Compensations: Slow rate;Small sips/bites;Other (Comment) (oral suction if pt does not swallow) Postural Changes and/or Swallow Maneuvers: Seated upright 90 degrees;Upright 30-60 min after meal                Oral Care Recommendations: Oral care QID Follow up Recommendations: Other (comment) (TBD) SLP Visit Diagnosis: Dysphagia, oropharyngeal phase (R13.12) Plan: Continue with current plan of care  Shoals, [name] Ann 02/10/2017, 5:56 PM  [name], [facility] SLP [phone]

## 2017-02-10 NOTE — Progress Notes (Addendum)
[facility] [place] RN Visit at 0845am.  This is a related and covered GIP admission of 02/08/17 with [facility] diagnosis of Cerebrovascular disease, per Dr. Tomasa Hosteller. Code status: full code. Family activated EMS for worsening SOB and fever. Hospice was not notified prior to calling EMS.  Visited with patient at bedside.  Patient was alert, and would answer yes and no questions only.  No family present at bedside this morning.  Patient was coughing more this morning.  Patient in NAD.  No SOB and patient denied any pain this morning.  Student RNs at bedside.    Patient receiving:  Enoxaparin (LOVENOX) injection 30 mg, Dose 30 mg, Q24H via [place]; feeding supplement (ENSURE ENLIVE) liquid 237 mL, Dose 237 mL, TID via PO; fluconazole (DIFLUCAN) tablet 100 mg, Dose 100 mg, QD via PO; ipratropium-albuterol (DUONEB) 0.5 -2.5 (3) MG/3ML nebulizer solution 3 mL, Dose 3 mL, TID via nebulization; prediniSONE (DETASONE) tablet 40 mg, Dose 40 mg, QD with breakfast via PO.  Continuous Medications:  ceFEPIme (MAXIPIME) 1 g in dextrose 5% 50 mL IVPB, Dose 1 g, Q8H via IV; dextrose 5% with KCl 20 mEq / L infusion, Rate 75 mL/hr, Dose 20 mEq, Continuous via IV; vancomycin (VANCOCIN) 500 mg in sodium chloride 0.9% 100 mL IVPB, Dose 500 mg, Q24H via IV.  PRN medications:  Has not received PRN medications today thus far.   If you have any hospice related questions or concerns, please feel free to contact me.   Thank you.  Edyth Gunnels, RN, BSN [name] Liaison [phone]  All hospital liaisons are now on [place].   UPDATE:  Spoke with Melanie, RN PMT - advised that [facility] would need assistance to establish [place].  I spoke with Suanne Marker, [facility] - who will get an order from attending to consult PMT.  We will need to have a joint meeting with PMT and an interpreter, [place] liaison needs to attend.  The meeting needs to include patient and family.

## 2017-02-10 NOTE — Progress Notes (Signed)
Pharmacy Antibiotic Note  [name] is a 66 y.o. male with SOB and fever admitted on 02/07/2017 with pneumonia.  Pharmacy has been consulted for cefepime and vancomycin dosing for HCAP.   02/10/2017 Full day abx #3. WBC WNL, creat 0.42. Hypothermic/AF. MRSA PCR negative.  BCID MR CNS in 2 of 4 bottles- could be 2 contaminants, repeat BCx drawn 10/31 pending.   Plan: Cefepime 1 Gm IV q8h Decrease vancomycin to 500 mg IV q24 for AUC 404 Diflucan 100 po qday for thrush F/u repeat BC - stop vanco if negative F/u scr/cultures/levels  Weight: 88 lb (39.9 kg)  Temp (24hrs), Avg:97.7 F (36.5 C), Min:97.4 F (36.3 C), Max:98.1 F (36.7 C)   Recent Labs Lab 02/07/17 2150 02/07/17 2239 02/08/17 0008 02/08/17 1336 02/09/17 0605 02/10/17 0541  WBC 11.2*  --   --  10.3 8.1 9.9  CREATININE 0.79  --   --  0.48* 0.34* 0.42*  LATICACIDVEN  --  1.37 1.25  --   --   --     Estimated Creatinine Clearance: 52 mL/min (A) (by C-G formula based on SCr of 0.42 mg/dL (L)).    No Known Allergies Antimicrobials this admission: 10/29 zosyn >> x1 ED 10/30 cefepime >> 10/29 vancomycin >>  10/31 diflucan(thrush)>>  Dose adjustments this admission: 11/1 decr vanc from 750 q24 to 500 q24 for AUC 404  Microbiology results: 10/29 BCx: Gm + cocci in clusters 2 of 4  bottles (1 in each set)> BCID staph species (not aureus) with methiciliin resistance.MR CNS 10/31 BCx2>>sent 10/30 Ucx>>NGF 10/30 sputum- not yet collected 10/31 MRSA PCR neg 10/30 strep pneumo Uag neg  Thank you for allowing pharmacy to be a part of this patient's care. Eudelia Bunch, Pharm.D. [phone] 02/10/2017 7:36 AM

## 2017-02-11 DIAGNOSIS — R1312 Dysphagia, oropharyngeal phase: Secondary | ICD-10-CM

## 2017-02-11 DIAGNOSIS — Z515 Encounter for palliative care: Secondary | ICD-10-CM

## 2017-02-11 DIAGNOSIS — Z7189 Other specified counseling: Secondary | ICD-10-CM

## 2017-02-11 MED ORDER — AMOXICILLIN-POT CLAVULANATE 250-62.5 MG/5ML PO SUSR
500.0000 mg | Freq: Three times a day (TID) | ORAL | Status: DC
  Administered 2017-02-11 – 2017-02-12 (×2): 500 mg via ORAL
  Filled 2017-02-11 (×3): qty 10

## 2017-02-11 MED ORDER — POTASSIUM CL IN DEXTROSE 5% 20 MEQ/L IV SOLN
20.0000 meq | INTRAVENOUS | Status: DC
  Filled 2017-02-11: qty 1000

## 2017-02-11 MED ORDER — AMOXICILLIN-POT CLAVULANATE 400-57 MG/5ML PO SUSR
875.0000 mg | Freq: Two times a day (BID) | ORAL | Status: DC
  Filled 2017-02-11: qty 10.9

## 2017-02-11 MED ORDER — LIP MEDEX EX OINT
TOPICAL_OINTMENT | CUTANEOUS | Status: AC
  Administered 2017-02-11: 16:00:00
  Filled 2017-02-11: qty 7

## 2017-02-11 NOTE — Progress Notes (Addendum)
MOST form was given to patient's wife; signed and took home with her.  Paged speech therapy to notified that patient's family need education for feeding patient at home

## 2017-02-11 NOTE — Progress Notes (Signed)
Home Care SW met with Patient, family, Hospice RN Liaison, [place] Physician, and RN on floor to help interpret.  Discussion held regarding Patient and family wishes and disease progression.  Family decided they wanted to continue to keep Hospice at home and Patient stated he did not want any extraordinary measures to keep him alive.  They decided to change to a DNR and Patient will go home at time of discharge.  SW did encourage them to continue to follow up on [place] paperwork.  [name], [place] [facility] [place]

## 2017-02-11 NOTE — Progress Notes (Signed)
[facility] [place] RN Visit at 0900am.  This is a related and covered GIP admission of 10/30/18with [facility] diagnosis of Cerebrovascular disease, per Dr. Tomasa Hosteller. Code status: full code. Family activated EMS for worsening SOB and fever. Hospice was not notified prior to calling EMS.  Visited with patient, wife, daughter and son at bedside.  Dr. Rowe Pavy, myself, [name], [facility] CSW, and Kerry Dory, bedside RN was in attendance.  Used interpreter services for communication, as well as additional help from [place], Therapist, sports.  We discussed with patient/family the plan of care moving forward for patient.  Discussions were had about hospice care versus more curative care.  Dr. Rowe Pavy explained aspiration PNA and how this will be most likely a chronic condition.  Patient and family understand the information given and were asked what is their plan for care moving forward.  They advised that they would like to continue with hospice care at home.  The patient was clear that he did not want CPR, intubations, PEG tubes, etc.   Patient and family decided to transition to comfort care only with no more curative measures.  Family understands that hospice cannot be in the home every day and that we do not provide 24/7 care in the home.  Dr. Rowe Pavy will get DNR, MOST forms ready and get patient transitioned.  [facility] will continue to offer support and will follow patient while he remains hospitalized.     Patient receiving: Enoxaparin (LOVENOX) injection 30 mg, Dose 30 mg, Q24H via [place]; feeding supplement (ENSURE ENLIVE) liquid 237 mL, Dose 237 mL, TID via PO; fluconazole (DIFLUCAN) tablet 100 mg, Dose 100 mg, QD via PO; ipratropium-albuterol (DUONEB) 0.5 -2.5 (3) MG/3ML nebulizer solution 3 mL, Dose 3 mL, TID via nebulization; prediniSONE (DETASONE) tablet 40 mg, Dose 40 mg, QD with breakfast via PO. Continuous Medications: ceFEPIme (MAXIPIME) 1 g in dextrose 5% 50 mL IVPB, Dose 1 g, Q8H via IV;  dextrose 5% with KCl 20 mEq / L infusion, Rate 75 mL/hr, Dose 20 mEq, Continuous via IV; vancomycin (VANCOCIN) 500 mg in sodium chloride 0.9% 100 mL IVPB, Dose 500 mg, Q24H via IV. PRN medications: Has not received PRN medications today thus far.   If you have any hospice related questions or concerns, please feel free to contact me.   Thank you.  Edyth Gunnels, RN, BSN [facility] Liaison [phone]  All hospice liaisons are now on [place].

## 2017-02-11 NOTE — Progress Notes (Addendum)
[facility] PROGRESS NOTE  [name] GUR:[id number] DOB: [birth year] DOA: 02/07/2017  PCP: Josetta Huddle, MD  Brief History/Interval Summary: 66 year old male of [place] origin with a past medical history of polio, postpolio syndrome, history of stroke who is bedbound with contractures of his extremity who is under hospice care although full code presented after family called EMS for shortness of breath and fever.  Patient found to have pneumonia.  He was hospitalized for further management.  Reason for Visit: Healthcare associated pneumonia  Consultants: Palliative medicine  Procedures: None  Antibiotics: Vancomycin and cefepime.  To be changed over to Augmentin today Diflucan   Subjective/Interval History: Patient remains noncommunicative.  His family members are at the bedside today.    ROS: Unable to do  Objective:  Vital Signs  Vitals:   02/10/17 2113 02/10/17 2143 02/11/17 0529 02/11/17 0846  BP:  (!) 108/59 114/76   Pulse:  67 73   Resp:  17 18   Temp:  97.9 F (36.6 C) 98.2 F (36.8 C)   TempSrc:  Oral Oral   SpO2: 96% 98% 100% 98%  Weight:        Intake/Output Summary (Last 24 hours) at 02/11/17 1040 Last data filed at 02/11/17 0530  Gross per 24 hour  Intake             2230 ml  Output              750 ml  Net             1480 ml   Filed Weights   02/08/17 0358  Weight: 39.9 kg (88 lb)    General appearance: Awake alert.  In no distress No oral lesions identified. Resp: Diminished air entry at the bases.  No definite wheezing rales or rhonchi. Cardio: S1-S2 is normal regular.  No S3-S4.  No rubs murmurs or bruit GI: Abdomen remains soft.  Nontender nondistended.  No masses organomegaly. Extremities: Contracted lower extremities   Lab Results:  Data Reviewed: I have personally reviewed following labs and imaging studies  CBC:  Recent Labs Lab 02/07/17 2150 02/08/17 1336 02/09/17 0605 02/10/17 0541  WBC 11.2* 10.3 8.1 9.9    NEUTROABS 9.4* 9.4*  --   --   HGB 11.1* 10.8* 11.7* 10.9*  HCT 34.9* 34.3* 37.2* 34.7*  MCV 90.6 90.3 91.2 89.4  PLT 225 198 179 283    Basic Metabolic Panel:  Recent Labs Lab 02/07/17 2150 02/08/17 1336 02/09/17 0605 02/10/17 0541  NA 151* 151* 150* 140  K 2.1* 3.1* 3.9 3.8  CL 116* 121* 119* 110  CO2 _0 GLUCOSE 120* 149* 89 95  BUN 26* _1 CREATININE 0.79 0.48* 0.34* 0.42*  CALCIUM 7.8* 7.6* 7.8* 7.8*    GFR: Estimated Creatinine Clearance: 52 mL/min (A) (by C-G formula based on SCr of 0.42 mg/dL (L)).  Liver Function Tests:  Recent Labs Lab 02/07/17 2150  AST 15  ALT 10*  ALKPHOS 56  BILITOT 1.6*  PROT 5.8*  ALBUMIN 2.4*    Coagulation Profile:  Recent Labs Lab 02/08/17 1336  INR 1.12     Recent Results (from the past 240 hour(s))  Blood Culture (routine x 2)     Status: Abnormal (Preliminary result)   Collection Time: 02/07/17  9:50 PM  Result Value Ref Range Status   Specimen Description BLOOD RIGHT HAND  Final   Special Requests   Final    BOTTLES DRAWN AEROBIC  AND ANAEROBIC Blood Culture adequate volume   Culture  Setup Time   Final    GRAM POSITIVE COCCI IN CLUSTERS ANAEROBIC BOTTLE ONLY CRITICAL RESULT CALLED TO, READ BACK BY AND VERIFIED WITH: B.GREEN PHARMD 02/09/17 0541 L.CHAMPION    Culture (A)  Final    STAPHYLOCOCCUS EPIDERMIDIS CULTURE REINCUBATED FOR BETTER GROWTH Performed at [facility], [place] [address], [place], [place] [address]    Report Status PENDING  Incomplete  Blood Culture ID Panel (Reflexed)     Status: Abnormal   Collection Time: 02/07/17  9:50 PM  Result Value Ref Range Status   Enterococcus species NOT DETECTED NOT DETECTED Final   Listeria monocytogenes NOT DETECTED NOT DETECTED Final   Staphylococcus species DETECTED (A) NOT DETECTED Final    Comment: Methicillin (oxacillin) resistant coagulase negative staphylococcus. Possible blood culture contaminant (unless isolated from more than  one blood culture draw or clinical case suggests pathogenicity). No antibiotic treatment is indicated for blood  culture contaminants. CRITICAL RESULT CALLED TO, READ BACK BY AND VERIFIED WITH: B.GREEN PHARMD 02/09/17 0541 L.CHAMPION    Staphylococcus aureus NOT DETECTED NOT DETECTED Final   Methicillin resistance DETECTED (A) NOT DETECTED Final    Comment: CRITICAL RESULT CALLED TO, READ BACK BY AND VERIFIED WITH: B.GREEN PHARMD 02/09/17 0541 L.CHAMPION    Streptococcus species NOT DETECTED NOT DETECTED Final   Streptococcus agalactiae NOT DETECTED NOT DETECTED Final   Streptococcus pneumoniae NOT DETECTED NOT DETECTED Final   Streptococcus pyogenes NOT DETECTED NOT DETECTED Final   Acinetobacter baumannii NOT DETECTED NOT DETECTED Final   Enterobacteriaceae species NOT DETECTED NOT DETECTED Final   Enterobacter cloacae complex NOT DETECTED NOT DETECTED Final   Escherichia coli NOT DETECTED NOT DETECTED Final   Klebsiella oxytoca NOT DETECTED NOT DETECTED Final   Klebsiella pneumoniae NOT DETECTED NOT DETECTED Final   Proteus species NOT DETECTED NOT DETECTED Final   Serratia marcescens NOT DETECTED NOT DETECTED Final   Haemophilus influenzae NOT DETECTED NOT DETECTED Final   Neisseria meningitidis NOT DETECTED NOT DETECTED Final   Pseudomonas aeruginosa NOT DETECTED NOT DETECTED Final   Candida albicans NOT DETECTED NOT DETECTED Final   Candida glabrata NOT DETECTED NOT DETECTED Final   Candida krusei NOT DETECTED NOT DETECTED Final   Candida parapsilosis NOT DETECTED NOT DETECTED Final   Candida tropicalis NOT DETECTED NOT DETECTED Final    Comment: Performed at [facility], [place] [address], [place], [place] [address]  Blood Culture (routine x 2)     Status: Abnormal (Preliminary result)   Collection Time: 02/07/17  9:55 PM  Result Value Ref Range Status   Specimen Description BLOOD LEFT ANTECUBITAL  Final   Special Requests   Final    BOTTLES DRAWN AEROBIC AND ANAEROBIC  Blood Culture adequate volume   Culture  Setup Time   Final    GRAM POSITIVE COCCI IN CLUSTERS IN BOTH AEROBIC AND ANAEROBIC BOTTLES CRITICAL VALUE NOTED.  VALUE IS CONSISTENT WITH PREVIOUSLY REPORTED AND CALLED VALUE.    Culture (A)  Final    STAPHYLOCOCCUS CAPITIS THE SIGNIFICANCE OF ISOLATING THIS ORGANISM FROM A SINGLE SET OF BLOOD CULTURES WHEN MULTIPLE SETS ARE DRAWN IS UNCERTAIN. PLEASE NOTIFY THE MICROBIOLOGY DEPARTMENT WITHIN ONE WEEK IF SPECIATION AND SENSITIVITIES ARE REQUIRED. GRAM POSITIVE COCCI IDENTIFICATION TO FOLLOW Performed at [facility], [place] [address] [place], [place] [address]    Report Status PENDING  Incomplete  Urine culture     Status: None  Collection Time: 02/08/17  5:20 AM  Result Value Ref Range Status   Specimen Description URINE, CLEAN CATCH  Final   Special Requests NONE  Final   Culture   Final    NO GROWTH Performed at [facility], [address] [place], [place] [address]    Report Status 02/09/2017 FINAL  Final  MRSA PCR Screening     Status: None   Collection Time: 02/09/17  8:27 AM  Result Value Ref Range Status   MRSA by PCR NEGATIVE NEGATIVE Final    Comment:        The GeneXpert MRSA Assay (FDA approved for NASAL specimens only), is one component of a comprehensive MRSA colonization surveillance program. It is not intended to diagnose MRSA infection nor to guide or monitor treatment for MRSA infections.   Culture, blood (Routine X 2) w Reflex to ID Panel     Status: None (Preliminary result)   Collection Time: 02/09/17 10:03 AM  Result Value Ref Range Status   Specimen Description BLOOD RIGHT ANTECUBITAL  Final   Special Requests IN PEDIATRIC BOTTLE Blood Culture adequate volume  Final   Culture   Final    NO GROWTH 1 DAY Performed at [facility], [place] [address], [place], [place] [address]    Report Status PENDING  Incomplete  Culture, blood (Routine X 2) w Reflex to ID Panel     Status: None  (Preliminary result)   Collection Time: 02/09/17 10:09 AM  Result Value Ref Range Status   Specimen Description BLOOD RIGHT ANTECUBITAL  Final   Special Requests IN PEDIATRIC BOTTLE Blood Culture adequate volume  Final   Culture   Final    NO GROWTH 1 DAY Performed at [facility], [place] [address], [place], [place] [address]    Report Status PENDING  Incomplete      Radiology Studies: No results found.   Medications:  Scheduled: . amLODipine  10 mg Oral Daily  . aspirin EC  81 mg Oral Daily  . atorvastatin  10 mg Oral Daily  . cloNIDine  0.1 mg Oral TID  . collagenase   Topical Daily  . enoxaparin (LOVENOX) injection  30 mg Subcutaneous Q24H  . feeding supplement (ENSURE ENLIVE)  237 mL Oral TID BM  . fluconazole  100 mg Oral Daily  . ipratropium-albuterol  3 mL Nebulization TID  . losartan  50 mg Oral Daily  . pantoprazole sodium  40 mg Oral QHS  . predniSONE  40 mg Oral Q breakfast  . RESOURCE THICKENUP CLEAR   Oral TID AC  . sertraline  100 mg Oral Daily   Continuous: . ceFEPime (MAXIPIME) IV 1 g (02/11/17 0953)  . dextrose 5 % with KCl 20 mEq / L 20 mEq (02/11/17 0729)  . vancomycin Stopped (02/11/17 0055)   MQK:[id number], guaiFENesin  Assessment/Plan:  Principal Problem:   HCAP (healthcare-associated pneumonia) Active Problems:   Post-poliomyelitis muscular atrophy   Essential hypertension   Sepsis ([place])   Hypokalemia   Severe protein-calorie malnutrition (HCC)   Pressure ulcer of coccygeal region, stage 3 ([place])    Healthcare associated pneumonia with sepsis/dysphagia Patient recently hospitalized for pneumonia and acute respiratory failure.  There could be a component of aspiration as well.  Speech therapy has seen the patient.  Full liquid diet was recommended.  Repeat blood cultures without any growth.  Patient remains afebrile with normal WBC.  Change him over to Augmentin.  Continue Diflucan for oral  candidiasis.   Bacteremia/CNST Noted on  initial set of blood cultures.  Possibly contaminant.  Repeat blood cultures negative so far.  Okay to stop vancomycin.  Post-polio muscular atrophy Patient has contracted lower and upper extremities.  This is his baseline.  Hospice/goals of care Hospice is aware of patient's admission.  He remains full code.  Patient has also been seen by palliative medicine during previous hospitalization.  Palliative medicine met with hospice liaison as well as family members this morning.  Patient is now DNR.  No PEG tube to be placed in the future.  Discussed with family members.  They would like for the patient to return home tomorrow if possible.   Essential hypertension Patient is on multiple antihypertensive medications.  Blood pressure is reasonably well controlled.  Severe protein calorie malnutrition In the context of chronic illness.  Patient has significant dysphagia so is unlikely he will be able to meet his caloric requirements.  Patient appears to be slowly declining and approaching end of life gradually.  Not a candidate for PEG tube placement.  Stage III pressure ulcer of the coccygeal region Wound care.  Hypokalemia and hypernatremia Potassium level remains normal.  Sodium level has improved.  Cut back on IV fluids.  Normocytic anemia No evidence for overt bleeding.  Hemoglobin is stable.  DVT Prophylaxis: Lovenox    Code Status: DNR Family Communication: Patient's wife at bedside. Disposition Plan: Management as outlined above.  Anticipate discharge tomorrow.    LOS: 3 days   [place] [facility] Pager [phone] 02/11/2017, 10:40 AM  If 7PM-7AM, please contact night-coverage at [email or web address], password [facility]

## 2017-02-11 NOTE — Consult Note (Signed)
Consultation Note Date: 02/11/2017   Patient Name: [name]  DOB: 1950/11/01  MRN: [id number]  Age / Sex: 66 y.o., male  PCP: Josetta Huddle, MD Referring Physician: Bonnielee Haff, MD  Reason for Consultation: Establishing goals of care  HPI/Patient Profile: 66 y.o. 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  Patient found to have pneumonia.  He was hospitalized for further management on 02/07/2017   Patient known to PMT service, he was seen in previous hospitalization. At that time, with assistance from interpretor services, a family meeting was held with the patient's wife, son, daughter, along with [place] liaison, PMT provider and [facility] MD were all present. At that time, we had discussed about the patient's high risk of ongoing decline, his underlying conditions of stroke, bed bound status, debility and ongoing risk from recurrent asp pna. Code status and goals of care discussions were held, how ever, patient's wife and children wished to continue with full code/full scope, in addition to continuation of hospice services at home.    Palliative consult follows:  Clinical Assessment and Goals of Care:  Patient was seen on 02-10-17 and 02-11-17, family meeting with [place] [employer] planner, bedside RN who also acted as an Veterinary surgeon, wife, daughter, son, patient, palliative MD, [facility] liaison Amy and [facility] LCSW were all present.   We reviewed the patient's underlying conditions, we discussed in detail about aspiration and recurrent PNAs. Goals wishes and values discussed, explained several times about difference between aggressive life prolonging mode of care and hospice philosophy of care.   We discussed in detail about  artificial nutrition and hydration, risks associated with PEG tube discussed in detail. Patient opened his eyes, wife asked him his wishes, he stated that he wanted to be home, he did not want PEG tube.   See below  NEXT OF KIN  wife, son, daughter.   DISCUSSION/SUMMARY OF RECOMMENDATIONS: Code status now clarified as DNR DNI: Discussed frankly with patient and family that a CPR attempt would be futile, would cause more harm than benefit, patient does not want PEG tube.   No PEG tube.  Brief trial of oral antibiotics at home, to be determined by hospice as appropriate.   No IVF, no IV antibiotics Family to call [facility] first, to continue hospice philosophy of care at home. [facility] is working with family to increase hospice support as much as possible.   Prognosis weeks to some very limited number of months To go back home with hospice services on discharge.  MOST form completed, to be signed by wife.    Code Status/Advance Care Planning:  DNR    Symptom Management:      Palliative Prophylaxis:   Aspiration   Psycho-social/Spiritual:   Desire for further Chaplaincy support:yes  Additional Recommendations: Education on Hospice  Prognosis:   < 4 weeks  Discharge Planning: Home  with Hospice      Primary Diagnoses: Present on Admission: . HCAP (healthcare-associated pneumonia) . Sepsis ([place]) . Essential hypertension . Hypokalemia . Severe protein-calorie malnutrition ([place]) . Pressure ulcer of coccygeal region, stage 3 ([place])   I have reviewed the medical record, interviewed the patient and family, and examined the patient. The following aspects are pertinent.  Past Medical History:  Diagnosis Date  . Hypertension   . Polio   . Stroke [facility])    Social History   Social History  . Marital status: Married    Spouse name: Catha Nottingham  . Number of children: 3  . Years of education: 11th   Occupational History  . retired    Social History Main Topics  . Smoking  status: Former Smoker    Types: Cigarettes  . Smokeless tobacco: Never Used  . Alcohol use No  . Drug use: No  . Sexual activity: No   Other Topics Concern  . None   Social History Narrative   Patient is currently in [place]   Patient is right handed   Patient drink tea,coffee         Family History  Problem Relation Age of Onset  . Hypertension Mother   . Hypertension Father    Scheduled Meds: . amLODipine  10 mg Oral Daily  . aspirin EC  81 mg Oral Daily  . atorvastatin  10 mg Oral Daily  . cloNIDine  0.1 mg Oral TID  . collagenase   Topical Daily  . enoxaparin (LOVENOX) injection  30 mg Subcutaneous Q24H  . feeding supplement (ENSURE ENLIVE)  237 mL Oral TID BM  . fluconazole  100 mg Oral Daily  . ipratropium-albuterol  3 mL Nebulization TID  . losartan  50 mg Oral Daily  . pantoprazole sodium  40 mg Oral QHS  . predniSONE  40 mg Oral Q breakfast  . RESOURCE THICKENUP CLEAR   Oral TID AC  . sertraline  100 mg Oral Daily   Continuous Infusions: . ceFEPime (MAXIPIME) IV 1 g (02/11/17 0150)  . dextrose 5 % with KCl 20 mEq / L 20 mEq (02/11/17 0729)  . vancomycin Stopped (02/11/17 0055)   PRN Meds:.collagenase, guaiFENesin Medications Prior to Admission:  Prior to Admission medications   Medication Sig Start Date End Date Taking? Authorizing Provider  amLODipine (NORVASC) 10 MG tablet Take 1 tablet (10 mg total) by mouth daily. 06/16/15  Yes Rosalin Hawking, MD  aspirin EC 81 MG tablet Take 1 tablet (81 mg total) by mouth daily. 01/02/14  Yes Rosalin Hawking, MD  atorvastatin (LIPITOR) 10 MG tablet Take 10 mg by mouth daily. 11/15/16  Yes [provider]  cloNIDine (CATAPRES) 0.1 MG tablet Take 1 tablet (0.1 mg total) by mouth 3 (three) times daily. 06/16/15  Yes Rosalin Hawking, MD  guaiFENesin (MUCINEX) 600 MG 12 hr tablet Take 600 mg by mouth 2 (two) times daily as needed for to loosen phlegm.   Yes [provider]  ipratropium-albuterol (DUONEB) 0.5-2.5 (3)  MG/3ML SOLN Take 3 mLs by nebulization 3 (three) times daily. 01/20/17  Yes Hosie Poisson, MD  losartan (COZAAR) 100 MG tablet Take 0.5 tablets (50 mg total) by mouth daily. 06/16/15  Yes Rosalin Hawking, MD  Maltodextrin-Xanthan Gum ([place]) POWD Use with every meal. 01/20/17  Yes Hosie Poisson, MD  naproxen (NAPROSYN) 500 MG tablet Take 500 mg by mouth 2 (two) times daily with a meal.   Yes [provider]  NEXIUM 40 MG  capsule Take 40 mg by mouth daily.  10/24/14  Yes [provider]  sertraline (ZOLOFT) 100 MG tablet Take 100 mg by mouth daily.   Yes [provider]   No Known Allergies Review of Systems Denies pain Denies shortness of breath currently  Physical Exam Weak frail appearing asian gentleman resting in bed Has muscle wasting and frailty evident Diminished breath sounds bases S1 S2 Abdomen soft Contracted lower extremities Awake, but appears chronically ill, fatigued, is able to verbalize and interact with family with a few words, other wise, is resting in bed with eyes closed.   Vital Signs: BP 114/76 (BP Location: Right Arm)   Pulse 73   Temp 98.2 F (36.8 C) (Oral)   Resp 18   Wt 39.9 kg (88 lb)   SpO2 98%   BMI 16.10 kg/m  Pain Assessment: 0-10   Pain Score: 0-No pain   SpO2: SpO2: 98 % O2 Device:SpO2: 98 % O2 Flow Rate: .   IO: Intake/output summary:  Intake/Output Summary (Last 24 hours) at 02/11/17 0950 Last data filed at 02/11/17 0530  Gross per 24 hour  Intake             2230 ml  Output              750 ml  Net             1480 ml    LBM: Last BM Date: 02/10/17 Baseline Weight: Weight: 39.9 kg (88 lb) Most recent weight: Weight: 39.9 kg (88 lb)     Palliative Assessment/Data:     Time In:  9 Time Out:  10.30 Time Total:  90 minutes  Greater than 50%  of this time was spent counseling and coordinating care related to the above assessment and plan.  Signed by: Loistine Chance, MD  [phone]    Please contact Palliative Medicine Team phone at [phone] for questions and concerns.  For individual provider: See Shea Evans

## 2017-02-12 LAB — CULTURE, BLOOD (ROUTINE X 2)
SPECIAL REQUESTS: ADEQUATE
SPECIAL REQUESTS: ADEQUATE

## 2017-02-12 MED ORDER — PREDNISONE 20 MG PO TABS: ORAL_TABLET | ORAL | 0 refills | Status: DC

## 2017-02-12 MED ORDER — AMOXICILLIN-POT CLAVULANATE 250-62.5 MG/5ML PO SUSR: 500.0000 mg | Freq: Three times a day (TID) | ORAL | 0 refills | Status: AC

## 2017-02-12 NOTE — Progress Notes (Signed)
Discharge instructions and medications discussed with wife, Kenard Gower, via telephone.  All questions answered.

## 2017-02-12 NOTE — Care Management Note (Addendum)
Case Management Note  Patient Details  Name: [name] MRN: [id number] Date of Birth: July 29, 1950  Subjective/Objective: Received call from S[place] with [facility] re: transport to home for this pt. All services are in place and he will just need ambulance transport provided by [facility] (contracted agency for [employer]). CM spoke with Wells Guiles to update her on procedure and confirmed address with [facility]. CM notified MD of chart. Will continue to follow.      [phone]              Action/Plan:   Expected Discharge Date:   (unknown)               Expected Discharge Plan:  Home w Hospice Care  In-House Referral:     Discharge planning Services  CM Consult  Post Acute Care Choice:    Choice offered to:     DME Arranged:    DME Agency:     HH Arranged:    HH Agency:     Status of Service:  In process, will continue to follow  If discussed at Long Length of Stay Meetings, dates discussed:    Additional Comments:  Delrae Sawyers, RN 02/12/2017, 10:05 AM

## 2017-02-12 NOTE — Discharge Summary (Signed)
[facility]  Physician Discharge Summary   Patient ID: [name] MRN: [id number] DOB/AGE: May 09, 1950 66 y.o.  Admit date: 02/07/2017 Discharge date: 02/12/2017  PCP: Josetta Huddle, MD  DISCHARGE DIAGNOSES:  Principal Problem:   HCAP (healthcare-associated pneumonia) Active Problems:   Post-poliomyelitis muscular atrophy   Essential hypertension   Sepsis ([place])   Hypokalemia   Severe protein-calorie malnutrition ([place])   Pressure ulcer of coccygeal region, stage 3 (HCC)   RECOMMENDATIONS FOR OUTPATIENT FOLLOW UP: 1. Speech therapy to continue to follow patient at home.  This will be arranged by hospice. 2. Diet could be adjusted based on comfort   DISCHARGE CONDITION: poor  Diet recommendation: Full liquids only for now but to be further determined by speech therapy at home.  Filed Weights   02/08/17 0358  Weight: 39.9 kg (88 lb)    INITIAL HISTORY: 66 year old male of [place] origin with a past medical history of polio, postpolio syndrome, history of stroke who is bedbound with contractures of his extremity who is under hospice care although full code presented after family called EMS for shortness of breath and fever.  Patient found to have pneumonia.  He was hospitalized for further management.  Consultations:  Palliative medicine   HOSPITAL COURSE:   Healthcare associated pneumonia with sepsis/dysphagia Patient recently hospitalized for pneumonia and acute respiratory failure.  There could be a component of aspiration as well.  Speech therapy has seen the patient.  Full liquid diet was recommended.  Repeat blood cultures without any growth.  Patient remains afebrile with normal WBC.  Changed him over to Augmentin.    Steroid taper.  He was given Diflucan for oral candidiasis and does not need any further treatment for same.  Hospice to arrange swallow evaluation at home.  Diet can be adjusted based on comfort with the understanding that he will  aspirate.  Bacteremia/CNST Noted on initial set of blood cultures.  Possibly contaminant.  Repeat blood cultures negative so far.    Vancomycin was discontinued.    Post-polio muscular atrophy Patient has contracted lower and upper extremities.  This is his baseline.  Hospice/goals of care Patient was followed by hospice during this hospitalization.  Palliative medicine was consulted.  Family discussions were held.  Patient was changed over to DNR status.  Hospice to continue to follow the patient at home.  MOST form was also filled.  No feeding tubes.  Essential hypertension Patient is on multiple antihypertensive medications.  Blood pressure is reasonably well controlled.  Severe protein calorie malnutrition In the context of chronic illness.  Patient has significant dysphagia so is unlikely he will be able to meet his caloric requirements.  Patient appears to be slowly declining and approaching end of life gradually.  Not a candidate for PEG tube placement.  Stage III pressure ulcer of the coccygeal region Wound care.  Hypokalemia and hypernatremia Potassium level remains normal.  Sodium level has improved.    Normocytic anemia No evidence for overt bleeding.  Hemoglobin is stable.  Overall stable.  Okay for discharge home today.  He will go home via EMS.   PERTINENT LABS:  The results of significant diagnostics from this hospitalization (including imaging, microbiology, ancillary and laboratory) are listed below for reference.    Microbiology: Recent Results (from the past 240 hour(s))  Blood Culture (routine x 2)     Status: Abnormal   Collection Time: 02/07/17  9:50 PM  Result Value Ref Range Status   Specimen Description BLOOD RIGHT HAND  Final   Special Requests   Final    BOTTLES DRAWN AEROBIC AND ANAEROBIC Blood Culture adequate volume   Culture  Setup Time   Final    GRAM POSITIVE COCCI IN CLUSTERS ANAEROBIC BOTTLE ONLY CRITICAL RESULT CALLED TO, READ  BACK BY AND VERIFIED WITH: B.GREEN PHARMD 02/09/17 0541 L.CHAMPION Performed at [facility], [place] [address] [place], [place] [address]    Culture STAPHYLOCOCCUS EPIDERMIDIS (A)  Final   Report Status 02/12/2017 FINAL  Final   Organism ID, Bacteria STAPHYLOCOCCUS EPIDERMIDIS  Final      Susceptibility   Staphylococcus epidermidis - MIC*    CIPROFLOXACIN <=0.5 SENSITIVE Sensitive     ERYTHROMYCIN >=8 RESISTANT Resistant     GENTAMICIN <=0.5 SENSITIVE Sensitive     OXACILLIN >=4 RESISTANT Resistant     TETRACYCLINE >=16 RESISTANT Resistant     VANCOMYCIN 1 SENSITIVE Sensitive     TRIMETH/SULFA <=10 SENSITIVE Sensitive     CLINDAMYCIN >=8 RESISTANT Resistant     RIFAMPIN <=0.5 SENSITIVE Sensitive     Inducible Clindamycin NEGATIVE Sensitive     * STAPHYLOCOCCUS EPIDERMIDIS  Blood Culture ID Panel (Reflexed)     Status: Abnormal   Collection Time: 02/07/17  9:50 PM  Result Value Ref Range Status   Enterococcus species NOT DETECTED NOT DETECTED Final   Listeria monocytogenes NOT DETECTED NOT DETECTED Final   Staphylococcus species DETECTED (A) NOT DETECTED Final    Comment: Methicillin (oxacillin) resistant coagulase negative staphylococcus. Possible blood culture contaminant (unless isolated from more than one blood culture draw or clinical case suggests pathogenicity). No antibiotic treatment is indicated for blood  culture contaminants. CRITICAL RESULT CALLED TO, READ BACK BY AND VERIFIED WITH: B.GREEN PHARMD 02/09/17 0541 L.CHAMPION    Staphylococcus aureus NOT DETECTED NOT DETECTED Final   Methicillin resistance DETECTED (A) NOT DETECTED Final    Comment: CRITICAL RESULT CALLED TO, READ BACK BY AND VERIFIED WITH: B.GREEN PHARMD 02/09/17 0541 L.CHAMPION    Streptococcus species NOT DETECTED NOT DETECTED Final   Streptococcus agalactiae NOT DETECTED NOT DETECTED Final   Streptococcus pneumoniae NOT DETECTED NOT DETECTED Final   Streptococcus pyogenes NOT DETECTED NOT  DETECTED Final   Acinetobacter baumannii NOT DETECTED NOT DETECTED Final   Enterobacteriaceae species NOT DETECTED NOT DETECTED Final   Enterobacter cloacae complex NOT DETECTED NOT DETECTED Final   Escherichia coli NOT DETECTED NOT DETECTED Final   Klebsiella oxytoca NOT DETECTED NOT DETECTED Final   Klebsiella pneumoniae NOT DETECTED NOT DETECTED Final   Proteus species NOT DETECTED NOT DETECTED Final   Serratia marcescens NOT DETECTED NOT DETECTED Final   Haemophilus influenzae NOT DETECTED NOT DETECTED Final   Neisseria meningitidis NOT DETECTED NOT DETECTED Final   Pseudomonas aeruginosa NOT DETECTED NOT DETECTED Final   Candida albicans NOT DETECTED NOT DETECTED Final   Candida glabrata NOT DETECTED NOT DETECTED Final   Candida krusei NOT DETECTED NOT DETECTED Final   Candida parapsilosis NOT DETECTED NOT DETECTED Final   Candida tropicalis NOT DETECTED NOT DETECTED Final    Comment: Performed at [facility], [place] [address] [place], [place] [address]  Blood Culture (routine x 2)     Status: Abnormal   Collection Time: 02/07/17  9:55 PM  Result Value Ref Range Status   Specimen Description BLOOD LEFT ANTECUBITAL  Final   Special Requests   Final    BOTTLES DRAWN AEROBIC AND ANAEROBIC Blood Culture adequate volume   Culture  Setup Time   Final  GRAM POSITIVE COCCI IN CLUSTERS IN BOTH AEROBIC AND ANAEROBIC BOTTLES CRITICAL VALUE NOTED.  VALUE IS CONSISTENT WITH PREVIOUSLY REPORTED AND CALLED VALUE.    Culture (A)  Final    STAPHYLOCOCCUS EPIDERMIDIS SUSCEPTIBILITIES PERFORMED ON PREVIOUS CULTURE WITHIN THE LAST 5 DAYS. Performed at [facility], [place] [address] [place], [place] [address]    Report Status 02/12/2017 FINAL  Final  Urine culture     Status: None   Collection Time: 02/08/17  5:20 AM  Result Value Ref Range Status   Specimen Description URINE, CLEAN CATCH  Final   Special Requests NONE  Final   Culture   Final    NO GROWTH Performed at [facility], [place] [address], [place], [place] [address]    Report Status 02/09/2017 FINAL  Final  MRSA PCR Screening     Status: None   Collection Time: 02/09/17  8:27 AM  Result Value Ref Range Status   MRSA by PCR NEGATIVE NEGATIVE Final    Comment:        The GeneXpert MRSA Assay (FDA approved for NASAL specimens only), is one component of a comprehensive MRSA colonization surveillance program. It is not intended to diagnose MRSA infection nor to guide or monitor treatment for MRSA infections.   Culture, blood (Routine X 2) w Reflex to ID Panel     Status: None (Preliminary result)   Collection Time: 02/09/17 10:03 AM  Result Value Ref Range Status   Specimen Description BLOOD RIGHT ANTECUBITAL  Final   Special Requests IN PEDIATRIC BOTTLE Blood Culture adequate volume  Final   Culture   Final    NO GROWTH 2 DAYS Performed at [facility], [place] [address], [place], [place] [address]    Report Status PENDING  Incomplete  Culture, blood (Routine X 2) w Reflex to ID Panel     Status: None (Preliminary result)   Collection Time: 02/09/17 10:09 AM  Result Value Ref Range Status   Specimen Description BLOOD RIGHT ANTECUBITAL  Final   Special Requests IN PEDIATRIC BOTTLE Blood Culture adequate volume  Final   Culture   Final    NO GROWTH 2 DAYS Performed at [facility], [place] [address], [place], [place] [address]    Report Status PENDING  Incomplete     Labs: Basic Metabolic Panel:  Recent Labs Lab 02/07/17 2150 02/08/17 1336 02/09/17 0605 02/10/17 0541  NA 151* 151* 150* 140  K 2.1* 3.1* 3.9 3.8  CL 116* 121* 119* 110  CO2 22 22 23 23   GLUCOSE 120* 149* 89 95  BUN 26* 18 12 9   CREATININE 0.79 0.48* 0.34* 0.42*  CALCIUM 7.8* 7.6* 7.8* 7.8*   Liver Function Tests:  Recent Labs Lab 02/07/17 2150  AST 15  ALT 10*  ALKPHOS 56  BILITOT 1.6*  PROT 5.8*  ALBUMIN 2.4*   CBC:  Recent Labs Lab 02/07/17 2150 02/08/17 1336 02/09/17 0605  02/10/17 0541  WBC 11.2* 10.3 8.1 9.9  NEUTROABS 9.4* 9.4*  --   --   HGB 11.1* 10.8* 11.7* 10.9*  HCT 34.9* 34.3* 37.2* 34.7*  MCV 90.6 90.3 91.2 89.4  PLT 225 198 179 231   BNP: BNP (last 3 results)  Recent Labs  01/15/17 1319  BNP 44.9     IMAGING STUDIES Ct Angio Chest Pe W And/or Wo Contrast  Result Date: 01/15/2017 CLINICAL DATA:  Malaise times several days with dyspnea productive cough. Rhonchi. EXAM: CT ANGIOGRAPHY  CHEST WITH CONTRAST TECHNIQUE: Multidetector CT imaging of the chest was performed using the standard protocol during bolus administration of intravenous contrast. Multiplanar CT image reconstructions and MIPs were obtained to evaluate the vascular anatomy. CONTRAST:  70 cc Isovue 370 IV COMPARISON:  CXR 01/15/2017 and 12/17/2013 FINDINGS: Cardiovascular: Heart size is normal. There is no pericardial effusion. No acute pulmonary embolus to the segmental level. The aorta is normal in caliber with minimal aortic atherosclerosis. No dissection. Mediastinum/Nodes: Small precarinal and right hilar subcentimeter short axis lymph nodes are noted. No axillary or supraclavicular lymphadenopathy. The trachea and mainstem bronchi are patent however there are finger-like soft tissue densities within the lobar, segmental and subsegmental branches to the left lower lobe. Lungs/Pleura: Left lower lobe atelectasis due to soft tissue densities within lobar, segmental and subsegmental branches to the left lower lobe. Peribronchial thickening is noted to both lower lobes and lingula. Nonspecific scattered nodular infiltrates are identified to the left upper lobe and lingula. Upper Abdomen: The liver is hypodense in appearance consistent with hepatic steatosis. No acute upper abdominal abnormality. Musculoskeletal: The bones are demineralized in appearance. No acute nor suspicious osseous abnormalities. Review of the MIP images confirms the above findings. IMPRESSION: 1. Left lower lobe  atelectasis due to endobronchial soft tissue densities noted of left lower lobe bronchi with differential possibilities more commonly would include mucous plugging though endobronchial lesions are not entirely excluded. 2. Scattered nodular infiltrates in the left upper lobe and lingula, nonspecific possibly related to community-acquired pneumonia such as mycoplasma, fungal infection, or granulomatous disease potentially. Followup PA and lateral chest X-ray is recommended in 3-4 weeks following trial of antibiotic therapy to ensure resolution and exclude underlying malignancy. 3. No acute pulmonary embolus. Aortic Atherosclerosis (ICD10-I70.0). Electronically Signed   By: Ashley Royalty M.D.   On: 01/15/2017 16:02   Dg Chest Port 1 View  Result Date: 02/07/2017 CLINICAL DATA:  66 y/o  M; diminished breathing starting yesterday. EXAM: PORTABLE CHEST 1 VIEW COMPARISON:  01/16/2017 chest radiograph. FINDINGS: Stable cardiac silhouette within normal limits given projection and technique. Consolidation in the left lung base. No pleural effusion or pneumothorax. Bones are unremarkable. IMPRESSION: Left lung base consolidation suspicious for pneumonia. Electronically Signed   By: Kristine Garbe M.D.   On: 02/07/2017 22:24   Dg Chest Port 1 View  Result Date: 01/16/2017 CLINICAL DATA:  Shortness of breath.  Follow-up exam. EXAM: PORTABLE CHEST 1 VIEW COMPARISON:  01/15/2017 FINDINGS: Left lower lobe collapse and patchy areas of left upper lobe opacity is similar to the previous day's study. Right lung is clear. No pleural effusion.  No pneumothorax. Heart is normal in size. IMPRESSION: 1. No significant change from the previous day's chest radiograph or chest CT. 2. Left lower lobe collapse and patchy areas of left upper lobe infiltrate. Clear right lung. Electronically Signed   By: Lajean Manes M.D.   On: 01/16/2017 08:52   Dg Chest Portable 1 View  Result Date: 01/15/2017 CLINICAL DATA:  Feeling  unwell for the past few days with SOB. Has also had productive cough. EMS noted rhonchi throughout. SPO2 in 70s with EMS. Increased to 80s on NRB. EXAM: PORTABLE CHEST 1 VIEW COMPARISON:  12/17/2013 FINDINGS: There is mild left basilar atelectasis. There is no focal consolidation. There is no pleural effusion or pneumothorax. The heart and mediastinal contours are unremarkable. The osseous structures are unremarkable. IMPRESSION: No active cardiopulmonary disease. If there is further clinical concern, recommend a two-view chest. Electronically Signed   By:  Kathreen Devoid   On: 01/15/2017 13:45   Dg Swallowing Func-speech Pathology  Result Date: 01/17/2017 Objective Swallowing Evaluation: Type of Study: MBS-Modified Barium Swallow Study Patient Details Name: Roddie Riegler MRN: [id number] Date of Birth: 1950/07/27 Today's Date: 01/17/2017 Time: SLP Start Time (ACUTE ONLY): 1215-SLP Stop Time (ACUTE ONLY): 1240 SLP Time Calculation (min) (ACUTE ONLY): 25 min Past Medical History: Past Medical History: Diagnosis Date . Hypertension  . Polio  . Stroke [facility])  Past Surgical History: Past Surgical History: Procedure Laterality Date . 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 Pt found to have acute respiratory failure with hypoxia, community acquired PNA, sepsis d/t pna. CXR 10/7 left lower lobe collapse and patchy areas of left upper lobe infiltrate. Clear right lung. BSE recommended MBS. Subjective: The patient was seen sitting upright in bed.  Assessment / Plan / Recommendation CHL IP CLINICAL IMPRESSIONS 01/17/2017 Clinical Impression   Moderate oral impairments included decreased cohesion and control, delayed posterior propulsion and inconsistent piecemeal transit pattern. He  exhibited a mild-moderate motor based pharyngeal dysphagia resulting in vallecular and pyriform residue with decreased effectiveness of multiple swallows at end of study due to possible fatigue. Laryngeal penetration to vocal cords with initial trial of MBS with honey which pt held in oral cavity for significant amount of time. When swallow initiated, fluoroscopy was turned off therefore uncertain if penetration to vocal cords occured prior to initiation or during the swallow. Flash penetration with nectar thick x 1. Mild residue in cervical esophagus possibly due to tight UES. Given pt's lower extremity contractures, unable to lower camera to view esophagus. Recommend Dys 1 (puree) texture, nectar thick liquids, second swallow, pills crushed, sit upright, no straws, full assist and supervision during meals and continued ST.    SLP Visit Diagnosis Dysphagia, oropharyngeal phase (R13.12) Attention and concentration deficit following -- Frontal lobe and executive function deficit following -- Impact on safety and function (No Data)   CHL IP TREATMENT RECOMMENDATION 01/17/2017 Treatment Recommendations Therapy as outlined in treatment plan below   Prognosis 01/17/2017 Prognosis for Safe Diet Advancement Fair Barriers to Reach Goals -- Barriers/Prognosis Comment -- CHL IP DIET RECOMMENDATION 01/17/2017 SLP Diet Recommendations Dysphagia 1 (Puree) solids;Nectar thick liquid Liquid Administration via Cup;No straw Medication Administration Crushed with puree Compensations Slow rate;Small sips/bites;Minimize environmental distractions;Multiple dry swallows after each bite/sip Postural Changes Seated upright at 90 degrees   CHL IP OTHER RECOMMENDATIONS 01/17/2017 Recommended Consults -- Oral Care Recommendations Oral care QID Other Recommendations --   CHL IP FOLLOW UP RECOMMENDATIONS 01/17/2017 Follow up Recommendations (No Data)   CHL IP FREQUENCY AND DURATION 01/17/2017 Speech Therapy Frequency (ACUTE ONLY) min 2x/week  Treatment Duration 2 weeks      CHL IP ORAL PHASE 01/17/2017 Oral Phase Impaired Oral - Pudding Teaspoon -- Oral - Pudding Cup -- Oral - Honey Teaspoon -- Oral - Honey Cup Decreased bolus cohesion;Delayed oral transit;Piecemeal swallowing;Reduced posterior propulsion Oral - Nectar Teaspoon -- Oral - Nectar Cup Decreased bolus cohesion;Delayed oral transit;Reduced posterior propulsion Oral - Nectar Straw -- Oral - Thin Teaspoon -- Oral - Thin Cup Decreased bolus cohesion;Delayed oral transit;Reduced posterior propulsion Oral - Thin Straw -- Oral - Puree Decreased bolus cohesion;Delayed oral transit;Reduced posterior propulsion Oral - Mech Soft -- Oral - Regular -- Oral -  Multi-Consistency -- Oral - Pill -- Oral Phase - Comment --  CHL IP PHARYNGEAL PHASE 01/17/2017 Pharyngeal Phase Impaired Pharyngeal- Pudding Teaspoon -- Pharyngeal -- Pharyngeal- Pudding Cup -- Pharyngeal -- Pharyngeal- Honey Teaspoon -- Pharyngeal -- Pharyngeal- Honey Cup Penetration/Aspiration during swallow;Pharyngeal residue - valleculae;Pharyngeal residue - pyriform;Reduced anterior laryngeal mobility;Reduced laryngeal elevation Pharyngeal Material enters airway, CONTACTS cords and not ejected out Pharyngeal- Nectar Teaspoon -- Pharyngeal -- Pharyngeal- Nectar Cup Pharyngeal residue - pyriform;Pharyngeal residue - valleculae;Penetration/Aspiration during swallow Pharyngeal Material enters airway, remains ABOVE vocal cords then ejected out Pharyngeal- Nectar Straw -- Pharyngeal -- Pharyngeal- Thin Teaspoon -- Pharyngeal -- Pharyngeal- Thin Cup Pharyngeal residue - pyriform;Pharyngeal residue - valleculae Pharyngeal -- Pharyngeal- Thin Straw -- Pharyngeal -- Pharyngeal- Puree Pharyngeal residue - valleculae;Pharyngeal residue - pyriform Pharyngeal -- Pharyngeal- Mechanical Soft -- Pharyngeal -- Pharyngeal- Regular -- Pharyngeal -- Pharyngeal- Multi-consistency -- Pharyngeal -- Pharyngeal- Pill -- Pharyngeal -- Pharyngeal Comment --  CHL IP CERVICAL  ESOPHAGEAL PHASE 01/17/2017 Cervical Esophageal Phase Impaired Pudding Teaspoon -- Pudding Cup -- Honey Teaspoon -- Honey Cup -- Nectar Teaspoon -- Nectar Cup -- Nectar Straw -- Thin Teaspoon -- Thin Cup -- Thin Straw -- Puree -- Mechanical Soft -- Regular -- Multi-consistency -- Pill -- Cervical Esophageal Comment -- No flowsheet data found. [place] Siren 01/17/2017, 3:24 PM Orbie Pyo Colvin Caroli.Ed CCC-SLP Pager [phone]               DISCHARGE EXAMINATION: Vitals:   02/11/17 2031 02/11/17 2051 02/12/17 0509 02/12/17 0845  BP:  97/72 130/76   Pulse:  76 72   Resp:  16 17   Temp:  97.7 F (36.5 C) 97.9 F (36.6 C)   TempSrc:  Oral Oral   SpO2: 98% 100% 100% 92%  Weight:       General appearance: alert, cooperative, appears stated age and no distress Resp: Diminished air entry at the bases without any wheezing normal effort Cardio: regular rate and rhythm, S1, S2 normal, no murmur, click, rub or gallop  DISPOSITION: Home with hospice  Discharge Instructions    Discharge instructions    Complete by:  As directed    You were cared for by a hospitalist during your hospital stay. If you have any questions about your discharge medications or the care you received while you were in the hospital after you are discharged, you can call the unit and asked to speak with the hospitalist on call if the hospitalist that took care of you is not available. Once you are discharged, your primary care physician will handle any further medical issues. Please note that NO REFILLS for any discharge medications will be authorized once you are discharged, as it is imperative that you return to your primary care physician (or establish a relationship with a primary care physician if you do not have one) for your aftercare needs so that they can reassess your need for medications and monitor your lab values. If you do not have a primary care physician, you can call [phone] for a physician referral.       ALLERGIES: No Known Allergies   Discharge Medication List as of 02/12/2017 10:38 AM    START taking these medications   Details  amoxicillin-clavulanate (AUGMENTIN) 250-62.5 MG/5ML suspension Take 10 mLs (500 mg total) by mouth every 8 (eight) hours., Starting Sat 02/12/2017, Until Fri 02/18/2017, Print    predniSONE (DELTASONE) 20 MG tablet Take 2 tablets daily for 3 days, then take 1 tablet once daily for 3  days, then STOP, Print      CONTINUE these medications which have NOT CHANGED   Details  amLODipine (NORVASC) 10 MG tablet Take 1 tablet (10 mg total) by mouth daily., Starting Mon 06/16/2015, Normal    aspirin EC 81 MG tablet Take 1 tablet (81 mg total) by mouth daily., Starting Wed 01/02/2014, No Print    atorvastatin (LIPITOR) 10 MG tablet Take 10 mg by mouth daily., Starting Mon 11/15/2016, Historical Med    cloNIDine (CATAPRES) 0.1 MG tablet Take 1 tablet (0.1 mg total) by mouth 3 (three) times daily., Starting Mon 06/16/2015, Normal    guaiFENesin (MUCINEX) 600 MG 12 hr tablet Take 600 mg by mouth 2 (two) times daily as needed for to loosen phlegm., Historical Med    ipratropium-albuterol (DUONEB) 0.5-2.5 (3) MG/3ML SOLN Take 3 mLs by nebulization 3 (three) times daily., Starting Thu 01/20/2017, Normal    losartan (COZAAR) 100 MG tablet Take 0.5 tablets (50 mg total) by mouth daily., Starting Mon 06/16/2015, Normal    Maltodextrin-Xanthan Gum (RESOURCE THICKENUP CLEAR) POWD Use with every meal., No Print    naproxen (NAPROSYN) 500 MG tablet Take 500 mg by mouth 2 (two) times daily with a meal., Historical Med    NEXIUM 40 MG capsule Take 40 mg by mouth daily. , Starting Thu 10/24/2014, Historical Med    sertraline (ZOLOFT) 100 MG tablet Take 100 mg by mouth daily., Historical Med          TOTAL DISCHARGE TIME: 35 minutes  [facility] Pager [phone]  02/12/2017, 1:48 PM

## 2017-02-12 NOTE — Discharge Instructions (Signed)
Aspiration Precautions, Adult Aspiration is the breathing in (inhalation) of a liquid or object into the lungs. Things that can be inhaled into the lungs include:  Food.  Any type of liquid, such as drinks or saliva.  Stomach contents, such as vomit or stomach acid.  What are the signs of aspiration? Signs of aspiration include:  Coughing after swallowing food or liquids.  Clearing the throat often while eating.  Trouble breathing. This may include: ? Breathing quickly. ? Breathing very slowly. ? Loud breathing. ? Rumbling sounds from the lungs while breathing.  Coughing up phlegm (sputum) that: ? Is yellow, tan, or green. ? Has pieces of food in it. ? Is bad-smelling.  Having a hoarse, barky cough.  Not being able to speak.  A hoarse voice.  Drooling while eating.  A feeling of fullness in the throat or a feeling that something is stuck in the throat.  Choking often.  Having a runny noise while eating.  Coughing when lying down or having to sit up quickly after lying down.  A change in skin color. The skin may look red or blue.  Fever.  Watery eyes.  Pain in the chest or back.  A pained look on the face.  What are the complications of aspiration? Complications of aspiration include:  Losing weight because the person is not absorbing needed nutrients.  Loss of enjoyment and the social benefits of eating.  Choking.  Lung irritation, if someone aspirates acidic food or drinks.  Lung infection (pneumonia).  Collection of infected liquid (pus) in the lungs (lung abscess).  In serious cases, death can occur. What can I do to prevent aspiration? Caring for someone who has a feeding tube If you are caring for someone who has a feeding tube who cannot eat or drink safely through his or her mouth:  Keep the person in an upright position as much as possible.  Do not lay the person flat if he or she is getting continuous feedings. If you need to lay the  person flat for any reason, turn the feeding pump off.  Check feeding tube residuals as told by your health care provider. Ask your health care provider what residual amount is too high.  Caring for someone who can eat and drink safely by mouth If you are caring for someone who can eat and drink safely through his or her mouth:  Have the person sit in an upright position when eating food or drinking fluids. This can be done in two ways: ? Have the person sit up in a chair. ? If sitting in a chair is not possible, position the person in bed so he or she is upright.  Remind the person to eat slowly and chew well. Make sure the person is awake and alert while eating.  Do not distract the person. This is especially important for people with thinking or memory (cognitive) problems.  Allow foods to cool. Hot foods may be more difficult to swallow.  Provide small meals more frequently, instead of 3 large meals. This may reduce fatigue during eating.  Check the person's mouth thoroughly for leftover food after eating.  Keep the person sitting upright for 30-45 minutes after eating.  Do not serve food or drink during 2 hours or more before bedtime.  General instructions Follow these general guidelines to prevent aspiration in someone who can eat and drink safely by mouth:  Never put food or liquids in the mouth of a person who is  not fully alert.  Feed small amounts of food. Do not force feed.  For a person who is on a diet for swallowing difficulty (dysphagia diet), follow the recommended food and drink consistency. For example, in dysphagia diet level 1, thicken liquids to pudding-like consistency.  Use as little water as possible when brushing the person's teeth or cleaning his or her mouth.  Provide oral care before and after meals.  Use adaptive devices such as cut-out cups, straws, or utensils as told by the health care provider.  Crush pills and put them in soft food such as  pudding or ice cream. Some pills should not be crushed. Check with the health care provider before crushing any medicine.  Contact a health care provider if:  The person has a feeding tube, and the feeding tube residual amount is too high.  The person has a fever.  The person tries to avoid food or water, such as refusing to eat, drink, or be fed, or is eating less than normal.  The person may have aspirated food or liquid.  You notice warning signs, such as choking or coughing, when the person eats or drinks. Get help right away if:  The person has trouble breathing or starts to breathe quickly.  The person is breathing very slowly or stops breathing.  The person coughs a lot after eating or drinking.  The person has a long-lasting (chronic) cough.  The person coughs up thick, yellow, or tan sputum.  If someone is choking on food or an object, perform the Heimlich maneuver (abdominal thrusts).  The person has symptoms of pneumonia, such as: ? Coughing a lot. ? Coughing up mucus with a bad smell or blood in it. ? Feeling short of breath. ? Complaining of chest pain. ? Sweating, fever, and chills. ? Feeling tired. ? Complaining of trouble breathing. ? Wheezing.  The person cannot stop choking.  The person is unable to breathe, turns blue, faints, or seems confused. These symptoms may represent a serious problem that is an emergency. Do not wait to see if the symptoms will go away. Get medical help right away. Call your local emergency services (911 in the [place]  Summary  Aspiration is the breathing in (inhalation) of a liquid or object into the lungs. Things that can be inhaled into the lungs include food, liquids, saliva, or stomach contents.  Aspiration can cause pneumonia or choking.  One sign of aspiration is coughing after swallowing food or liquids.  Contact a health care provider if you notice signs of aspiration. This information is not intended to replace  advice given to you by your health care provider. Make sure you discuss any questions you have with your health care provider. Document Released: 05/01/2010 Document Revised: 12/25/2015 Document Reviewed: 12/25/2015 Elsevier Interactive Patient Education  2018 [employer].   Full Liquid Diet A full liquid diet may be used:  To help you transition from a clear liquid diet to a soft diet.  When your body is healing and can only tolerate foods that are easy to digest.  Before or after certain a procedure, test, or surgery (such as stomach or intestinal surgeries).  If you have trouble swallowing or chewing.  A full liquid diet includes fluids and foods that are liquid or will become liquid at room temperature. The full liquid diet gives you the proteins, fluids, salts, and minerals that you need for energy. If you continue this diet for more than 72 hours, talk to your  health care provider about how many calories you need to consume. If you continue the diet for more than 5 days, talk to your health care provider about taking a multivitamin or a nutritional supplement. What do I need to know about a full liquid diet?  You may have any liquid.  You may have any food that becomes a liquid at room temperature. The food is considered a liquid if it can be poured off a spoon at room temperature.  Drink one serving of citrus or vitamin C-enriched fruit juice daily. What foods can I eat? Grains Any grain food that can be pureed in soup (such as crackers, pasta, and rice). Hot cereal (such as farina or oatmeal) that has been blended. Talk to your health care provider or dietitian about these foods. Vegetables Pulp-free tomato or vegetable juice. Vegetables pureed in soup. Fruits Fruit juice, including nectars and juices with pulp. Meats and Other Protein Sources Eggs in custard, eggnog mix, and eggs used in ice cream or pudding. Strained meats, like in baby food, may be allowed. Consult your  health care provider. Dairy Milk and milk-based beverages, including milk shakes and instant breakfast mixes. Smooth yogurt. Pureed cottage cheese. Avoid these foods if they are not well tolerated. Beverages All beverages, including liquid nutritional supplements. Ask your health care provider if you can have carbonated beverages. They may not be well tolerated. Condiments Iodized salt, pepper, spices, and flavorings. Cocoa powder. Vinegar, ketchup, yellow mustard, smooth sauces (such as hollandaise, cheese sauce, or white sauce), and soy sauce. Sweets and Desserts Custard, smooth pudding. Flavored gelatin. Tapioca, junket. Plain ice cream, sherbet, fruit ices. Frozen ice pops, frozen fudge pops, pudding pops, and other frozen bars with cream. Syrups, including chocolate syrup. Sugar, honey, jelly. Fats and Oils Margarine, butter, cream, sour cream, and oils. Other Broth and cream soups. Strained, broth-based soups. The items listed above may not be a complete list of recommended foods or beverages. Contact your dietitian for more options. What foods can I not eat? Grains All breads. Grains are not allowed unless they are pureed into soup. Vegetables Vegetables are not allowed unless they are juiced, or cooked and pureed into soup. Fruits Fruits are not allowed unless they are juiced. Meats and Other Protein Sources Any meat or fish. Cooked or raw eggs. Nut butters. Dairy Cheese. Condiments Stone ground mustards. Fats and Oils Fats that are coarse or chunky. Sweets and Desserts Ice cream or other frozen desserts that have any solids in them or on top, such as nuts, chocolate chips, and pieces of cookies. Cakes. Cookies. Candy. Others Soups with chunks or pieces in them. The items listed above may not be a complete list of foods and beverages to avoid. Contact your dietitian for more information. This information is not intended to replace advice given to you by your health care  provider. Make sure you discuss any questions you have with your health care provider. Document Released: 03/29/2005 Document Revised: 09/04/2015 Document Reviewed: 02/01/2013 Elsevier Interactive Patient Education  2017 [employer].

## 2017-02-12 NOTE — Care Management Note (Signed)
Case Management Note  Patient Details  Name: Billey Wojciak MRN: [id number] Date of Birth: Jul 17, 1950  Subjective/Objective:                    Action/Plan:   Expected Discharge Date:   (unknown)               Expected Discharge Plan:  Home w Hospice Care  In-House Referral:     Discharge planning Services  CM Consult  Post Acute Care Choice:    Choice offered to:     DME Arranged:    DME Agency:     HH Arranged:    HH Agency:     Status of Service:  Completed, signed off  If discussed at [employer] of Stay Meetings, dates discussed:    Additional Comments:  Delrae Sawyers, RN 02/12/2017, 10:08 AM

## 2017-02-12 NOTE — Progress Notes (Signed)
[facility] RN Visit at [phone]  This is a related and covered GIP admission of 02/08/17 with [facility] diagnosis of Cerebrovascular disease, per Dr. Tomasa Hosteller. Family activated EMS for worsening SOB and fever. Hospice was not notified prior to calling EMS. Pt admitted to hospital for Healthcare associated pneumonia.  Pt is now a DNR/DNI after meeting with family and PMT 02/11/17. Awaiting signed MOST form to be returned by wife.  Pt is receiving dextrose 5% with KCL 20 mEq/ L at 10 cc/hr. Amoxicillin-clavulanate (AUGMENTIN) 250-62.5 MG/5ML suspension 500 mg Q 8 hours,  collagenase (SANTYL) ointment to left groin wound daily and prn soiling, enoxaparin (LOVENOX) injection 30 mg every 24 hours, feeding supplement (ENSURE ENLIVE) (ENSURE ENLIVE) liquid 237 mL 3 times daily between meals, fluconazole (DIFLUCAN) tablet 100 mg daily, ipratropium-albuterol (DUONEB) 0.5-2.5 (3) MG/3ML nebulizer solution 3 mL 3 times daily, predniSONE (DELTASONE) tablet 40 mg daily. He has not received any prn medications.  Visited with patient in room. No family is present at this time. Pt denies any pain. Dr. Maryland Pink rounded on patient and plan is for discharge today. Received report from bedside RN, Wells Guiles.  Notified Jeannie Crutchfield, RNCM of plan for discharge today and to call [facility] for transport when patient is ready.  Please call for any hospice related questions or concerns.  Thank you,   Margaretmary Eddy, RN, [facility] Liaison [phone]  [place] are on AMION.

## 2017-02-14 LAB — CULTURE, BLOOD (ROUTINE X 2)
Culture: NO GROWTH
Culture: NO GROWTH
SPECIAL REQUESTS: ADEQUATE
SPECIAL REQUESTS: ADEQUATE

## 2017-02-20 ENCOUNTER — Encounter (HOSPITAL_COMMUNITY): Payer: Self-pay | Admitting: Emergency Medicine

## 2017-02-20 ENCOUNTER — Observation Stay (HOSPITAL_COMMUNITY)
Admission: EM | Admit: 2017-02-20 | Discharge: 2017-02-21 | Disposition: A | Discharge status: Home / Self Care | Source: Ambulatory Visit | Attending: Internal Medicine | Admitting: Internal Medicine

## 2017-02-20 ENCOUNTER — Other Ambulatory Visit: Payer: Self-pay

## 2017-02-20 DIAGNOSIS — Z8673 Personal history of transient ischemic attack (TIA), and cerebral infarction without residual deficits: Secondary | ICD-10-CM | POA: Insufficient documentation

## 2017-02-20 DIAGNOSIS — E872 Acidosis: Secondary | ICD-10-CM | POA: Insufficient documentation

## 2017-02-20 DIAGNOSIS — E43 Unspecified severe protein-calorie malnutrition: Secondary | ICD-10-CM | POA: Diagnosis not present

## 2017-02-20 DIAGNOSIS — I10 Essential (primary) hypertension: Secondary | ICD-10-CM | POA: Insufficient documentation

## 2017-02-20 DIAGNOSIS — Y95 Nosocomial condition: Secondary | ICD-10-CM | POA: Diagnosis not present

## 2017-02-20 DIAGNOSIS — E785 Hyperlipidemia, unspecified: Secondary | ICD-10-CM | POA: Insufficient documentation

## 2017-02-20 DIAGNOSIS — Z8249 Family history of ischemic heart disease and other diseases of the circulatory system: Secondary | ICD-10-CM | POA: Insufficient documentation

## 2017-02-20 DIAGNOSIS — Z79899 Other long term (current) drug therapy: Secondary | ICD-10-CM | POA: Insufficient documentation

## 2017-02-20 DIAGNOSIS — M19072 Primary osteoarthritis, left ankle and foot: Secondary | ICD-10-CM | POA: Insufficient documentation

## 2017-02-20 DIAGNOSIS — F418 Other specified anxiety disorders: Secondary | ICD-10-CM | POA: Insufficient documentation

## 2017-02-20 DIAGNOSIS — Z66 Do not resuscitate: Secondary | ICD-10-CM | POA: Diagnosis not present

## 2017-02-20 DIAGNOSIS — Z8612 Personal history of poliomyelitis: Secondary | ICD-10-CM | POA: Insufficient documentation

## 2017-02-20 DIAGNOSIS — E860 Dehydration: Secondary | ICD-10-CM | POA: Insufficient documentation

## 2017-02-20 DIAGNOSIS — Z7982 Long term (current) use of aspirin: Secondary | ICD-10-CM | POA: Diagnosis not present

## 2017-02-20 DIAGNOSIS — I499 Cardiac arrhythmia, unspecified: Secondary | ICD-10-CM | POA: Insufficient documentation

## 2017-02-20 DIAGNOSIS — E876 Hypokalemia: Secondary | ICD-10-CM | POA: Diagnosis not present

## 2017-02-20 DIAGNOSIS — G8250 Quadriplegia, unspecified: Secondary | ICD-10-CM | POA: Insufficient documentation

## 2017-02-20 DIAGNOSIS — Z515 Encounter for palliative care: Secondary | ICD-10-CM

## 2017-02-20 DIAGNOSIS — Z87891 Personal history of nicotine dependence: Secondary | ICD-10-CM | POA: Diagnosis not present

## 2017-02-20 DIAGNOSIS — R5383 Other fatigue: Secondary | ICD-10-CM | POA: Insufficient documentation

## 2017-02-20 DIAGNOSIS — J9621 Acute and chronic respiratory failure with hypoxia: Principal | ICD-10-CM | POA: Insufficient documentation

## 2017-02-20 DIAGNOSIS — L89153 Pressure ulcer of sacral region, stage 3: Secondary | ICD-10-CM | POA: Insufficient documentation

## 2017-02-20 DIAGNOSIS — R4182 Altered mental status, unspecified: Secondary | ICD-10-CM | POA: Insufficient documentation

## 2017-02-20 MED ORDER — HALOPERIDOL LACTATE 2 MG/ML PO CONC
0.5000 mg | ORAL | Status: DC | PRN
  Filled 2017-02-20: qty 0.3

## 2017-02-20 MED ORDER — BIOTENE DRY MOUTH MT LIQD: 15.0000 mL | OROMUCOSAL | Status: DC | PRN

## 2017-02-20 MED ORDER — LORAZEPAM 1 MG PO TABS: 1.0000 mg | ORAL_TABLET | ORAL | Status: DC | PRN

## 2017-02-20 MED ORDER — MORPHINE SULFATE (PF) 4 MG/ML IV SOLN: 4.0000 mg | Freq: Once | INTRAVENOUS | Status: DC

## 2017-02-20 MED ORDER — GLYCOPYRROLATE 1 MG PO TABS
1.0000 mg | ORAL_TABLET | ORAL | Status: DC | PRN
  Filled 2017-02-20: qty 1

## 2017-02-20 MED ORDER — ACETAMINOPHEN 325 MG PO TABS: 650.0000 mg | ORAL_TABLET | Freq: Four times a day (QID) | ORAL | Status: DC | PRN

## 2017-02-20 MED ORDER — ONDANSETRON 4 MG PO TBDP: 4.0000 mg | ORAL_TABLET | Freq: Four times a day (QID) | ORAL | Status: DC | PRN

## 2017-02-20 MED ORDER — ONDANSETRON HCL 4 MG/2ML IJ SOLN: 4.0000 mg | Freq: Four times a day (QID) | INTRAMUSCULAR | Status: DC | PRN

## 2017-02-20 MED ORDER — HYDROMORPHONE HCL 1 MG/ML IJ SOLN
0.5000 mg | INTRAMUSCULAR | Status: DC | PRN
Start: 2017-02-20 — End: 2017-02-21

## 2017-02-20 MED ORDER — LOPERAMIDE HCL 2 MG PO CAPS: 2.0000 mg | ORAL_CAPSULE | ORAL | Status: DC | PRN

## 2017-02-20 MED ORDER — GLYCOPYRROLATE 0.2 MG/ML IJ SOLN: 0.2000 mg | INTRAMUSCULAR | Status: DC | PRN

## 2017-02-20 MED ORDER — DIPHENHYDRAMINE HCL 50 MG/ML IJ SOLN: 12.5000 mg | INTRAMUSCULAR | Status: DC | PRN

## 2017-02-20 MED ORDER — ACETAMINOPHEN 650 MG RE SUPP: 650.0000 mg | Freq: Four times a day (QID) | RECTAL | Status: DC | PRN

## 2017-02-20 MED ORDER — MORPHINE SULFATE (CONCENTRATE) 10 MG/0.5ML PO SOLN
5.0000 mg | ORAL | Status: DC | PRN
  Administered 2017-02-21: 5 mg via SUBLINGUAL
  Filled 2017-02-20: qty 0.5

## 2017-02-20 MED ORDER — MORPHINE SULFATE (CONCENTRATE) 10 MG/0.5ML PO SOLN: 5.0000 mg | ORAL | Status: DC | PRN

## 2017-02-20 MED ORDER — POLYVINYL ALCOHOL 1.4 % OP SOLN: 1.0000 [drp] | Freq: Four times a day (QID) | OPHTHALMIC | Status: DC | PRN

## 2017-02-20 MED ORDER — LORAZEPAM 2 MG/ML PO CONC: 1.0000 mg | ORAL | Status: DC | PRN

## 2017-02-20 MED ORDER — MORPHINE SULFATE (PF) 4 MG/ML IV SOLN
4.0000 mg | Freq: Once | INTRAVENOUS | Status: AC
  Administered 2017-02-20: 4 mg via INTRAMUSCULAR
  Filled 2017-02-20: qty 1

## 2017-02-20 MED ORDER — HALOPERIDOL 1 MG PO TABS: 0.5000 mg | ORAL_TABLET | ORAL | Status: DC | PRN

## 2017-02-20 MED ORDER — HALOPERIDOL LACTATE 5 MG/ML IJ SOLN: 0.5000 mg | INTRAMUSCULAR | Status: DC | PRN

## 2017-02-20 MED ORDER — LORAZEPAM 2 MG/ML IJ SOLN: 1.0000 mg | INTRAMUSCULAR | Status: DC | PRN

## 2017-02-20 NOTE — ED Notes (Signed)
Admitting MD paged for bed request. Waiting for response.

## 2017-02-20 NOTE — Progress Notes (Signed)
[facility] MSW note: Pt is a current [facility] home care patient. Pt brought into the ED today for shortness of breath. Discussion between Dr. Maryan Rued and Vance Gather, [facility] Triage RN to discuss pt's poor prognosis is likely days. MSW did call [facility] prior to ED to see if bed was available. No bed at [facility] available today. MSW met with daughter [name] and Dyanne Carrel, NP at the ED to discuss pt's status. Daughter express understanding that pt of pt's status. Daughter desires comfort care only. Daughter does not want to take pt home. Daughter desires [facility] for end of life. MSW explained that there are no beds at [facility] today. Pt to be admitted under observation according to Dyanne Carrel, NP until [facility] has a bed offer. MSW offered support to daughter and encouraged her to have closure with pt.   Marilynne Halsted, MSW  [phone]

## 2017-02-20 NOTE — ED Triage Notes (Signed)
Received pt from home with c/o labored respirations x 2 days. Pt had emesis but unsure when. Pt has history of stroke with deficits. Pt contracted B/L legs. Pt on NRB with mask by EMS pulse ox 88%. Pt DNR and MOST forms.

## 2017-02-20 NOTE — H&P (Signed)
History and Physical    [name] RJJ:[id number] DOB: [birth year] DOA: 02/20/2017  PCP: Josetta Huddle, MD Patient coming from: home  Chief Complaint: sob  HPI: [name] is a 66 y.o. male with medical history significant for hypertension, polio with paralysis, aspiration pneumonia, bacteremia, stroke, severe protein calorie malnutrition since emergency Department chief complaint worsening shortness of breath. So evaluation confirms likely end-of-life hospice. [facility] asked to admit  Information is obtained from the chart and the family and the hospice social worker. Wife reports patient has been home 1 week from the hospital after being treated for pneumonia. He did well for 5 days and then 2 days ago he developed worsening shortness of breath. Associated symptoms include increased lethargy decreased mentation decreased oral intake. During his last hospitalization he was consulted with pouch of care and hospice. At that hospitalization it was decided goals of care were no IV antibiotics no IV fluids and comfort care only.    ED Course: in the emergency department he is afebrile with a soft blood pressure, tachycardia, tachypnea, hypoxia, he is provided with morphine 4 mg 2 at the time of admission resting more comfortably  Review of Systems: As per HPI otherwise all other systems reviewed and are negative.   Ambulatory Status: Has been bedbound 3 years  Past Medical History:  Diagnosis Date  . Hypertension   . Polio   . Stroke [facility])     Past Surgical History:  Procedure Laterality Date  . NO PAST SURGERIES      Social History   Socioeconomic History  . Marital status: Married    Spouse name: Catha Nottingham  . Number of children: 3  . Years of education: 11th  . Highest education level: Not on file  Social Needs  . Financial resource strain: Not on file  . Food insecurity - worry: Not on file  . Food insecurity - inability: Not on file  . Transportation needs -  medical: Not on file  . Transportation needs - non-medical: Not on file  Occupational History  . Occupation: retired  Tobacco Use  . Smoking status: Former Smoker    Types: Cigarettes  . Smokeless tobacco: Never Used  Substance and Sexual Activity  . Alcohol use: No    Alcohol/week: 0.0 oz  . Drug use: No  . Sexual activity: No  Other Topics Concern  . Not on file  Social History Narrative   Patient is currently in [place]   Patient is right handed   Patient drink tea,coffee       No Known Allergies  Family History  Problem Relation Age of Onset  . Hypertension Mother   . Hypertension Father     Prior to Admission medications   Medication Sig Start Date End Date Taking? Authorizing Provider  amLODipine (NORVASC) 10 MG tablet Take 1 tablet (10 mg total) by mouth daily. 06/16/15   Rosalin Hawking, MD  aspirin EC 81 MG tablet Take 1 tablet (81 mg total) by mouth daily. 01/02/14   Rosalin Hawking, MD  atorvastatin (LIPITOR) 10 MG tablet Take 10 mg by mouth daily. 11/15/16   [provider]  cloNIDine (CATAPRES) 0.1 MG tablet Take 1 tablet (0.1 mg total) by mouth 3 (three) times daily. 06/16/15   Rosalin Hawking, MD  guaiFENesin (MUCINEX) 600 MG 12 hr tablet Take 600 mg by mouth 2 (two) times daily as needed for to loosen phlegm.    [provider]  ipratropium-albuterol (DUONEB) 0.5-2.5 (3) MG/3ML SOLN Take 3  mLs by nebulization 3 (three) times daily. 01/20/17   Hosie Poisson, MD  losartan (COZAAR) 100 MG tablet Take 0.5 tablets (50 mg total) by mouth daily. 06/16/15   Rosalin Hawking, MD  Maltodextrin-Xanthan Gum ([place]) POWD Use with every meal. 01/20/17   Hosie Poisson, MD  naproxen (NAPROSYN) 500 MG tablet Take 500 mg by mouth 2 (two) times daily with a meal.    [provider]  NEXIUM 40 MG capsule Take 40 mg by mouth daily.  10/24/14   [provider]  predniSONE (DELTASONE) 20 MG tablet Take 2 tablets daily for 3 days, then take 1 tablet  once daily for 3 days, then STOP 02/12/17   Bonnielee Haff, MD  sertraline (ZOLOFT) 100 MG tablet Take 100 mg by mouth daily.    [provider]    Physical Exam: Vitals:   02/20/17 1019 02/20/17 1045 02/20/17 1115 02/20/17 1130  BP:  107/71 109/76 109/72  Pulse:   (!) 122 (!) 120  Resp:  (!) 35 (!) 35 (!) 35  SpO2:   (!) 82% (!) 83%  Weight: 39.9 kg (88 lb)        General:  Appears cachectic quite lethargic and nonresponsive to tactile stimuli Eyes:  PERRL, EOMI, normal lids, iris ENT:  grossly normal hearing, lips & tongue, mucous membranes of his mouth are pale and dry Neck:  no LAD, masses or thyromegaly Cardiovascular:  Tachycardia regular, no m/r/g. No LE edema. Bilateral leg contractures at hips and knees Respiratory:  Mild increased work of breathing. Decreased air movement Abdomen:  soft, ntnd, BS diminished Skin:  no rash or induration seen on limited exam Musculoskeletal:  LE severely contracted at hips and knees upper extremities contracted at elbow Psychiatric:  grossly normal mood and affect, speech fluent and appropriate, AOx3 Neurologic:    Labs on Admission: I have personally reviewed following labs and imaging studies  CBC: No results for input(s): WBC, NEUTROABS, HGB, HCT, MCV, PLT in the last 168 hours. Basic Metabolic Panel: No results for input(s): NA, K, CL, CO2, GLUCOSE, BUN, CREATININE, CALCIUM, MG, PHOS in the last 168 hours. GFR: Estimated Creatinine Clearance: 52 mL/min (A) (by C-G formula based on SCr of 0.42 mg/dL (L)). Liver Function Tests: No results for input(s): AST, ALT, ALKPHOS, BILITOT, PROT, ALBUMIN in the last 168 hours. No results for input(s): LIPASE, AMYLASE in the last 168 hours. No results for input(s): AMMONIA in the last 168 hours. Coagulation Profile: No results for input(s): INR, PROTIME in the last 168 hours. Cardiac Enzymes: No results for input(s): CKTOTAL, CKMB, CKMBINDEX, TROPONINI in the last 168 hours. BNP  (last 3 results) No results for input(s): PROBNP in the last 8760 hours. HbA1C: No results for input(s): HGBA1C in the last 72 hours. CBG: No results for input(s): GLUCAP in the last 168 hours. Lipid Profile: No results for input(s): CHOL, HDL, LDLCALC, TRIG, CHOLHDL, LDLDIRECT in the last 72 hours. Thyroid Function Tests: No results for input(s): TSH, T4TOTAL, FREET4, T3FREE, THYROIDAB in the last 72 hours. Anemia Panel: No results for input(s): VITAMINB12, FOLATE, FERRITIN, TIBC, IRON, RETICCTPCT in the last 72 hours. Urine analysis:    Component Value Date/Time   COLORURINE YELLOW 02/08/2017 0520   APPEARANCEUR CLEAR 02/08/2017 0520   LABSPEC 1.012 02/08/2017 0520   PHURINE 5.0 02/08/2017 0520   GLUCOSEU NEGATIVE 02/08/2017 0520   HGBUR SMALL (A) 02/08/2017 0520   BILIRUBINUR NEGATIVE 02/08/2017 0520   KETONESUR 5 (A) 02/08/2017 0520   PROTEINUR  NEGATIVE 02/08/2017 0520   UROBILINOGEN 1.0 12/17/2013 1152   NITRITE NEGATIVE 02/08/2017 0520   LEUKOCYTESUR NEGATIVE 02/08/2017 0520    Creatinine Clearance: Estimated Creatinine Clearance: 52 mL/min (A) (by C-G formula based on SCr of 0.42 mg/dL (L)).  Sepsis Labs: @LABRCNTIP (procalcitonin:4,lacticidven:4) )No results found for this or any previous visit (from the past 240 hour(s)).   Radiological Exams on Admission: No results found.  EKG:   Assessment/Plan Principal Problem:   End of life care    #1. End of life care. Patient recently discharged from hospital to home hospice. He had multiple recent hospitalization for aspiration and healthcare associated pneumonia. Most recent discharge was 1 week ago. Last hospitalization patient/family filled out MOST form and decided comfort care only best. He was brought back to the hospital today for increased work of breathing and lethargy. Discussed with home hospice triage nurse and hospice social work who relay that there is no hospice bed available at this time. Hopefully that  it became Place will become available tomorrow. Will admit for observation for comfort care -Analgesia per protocol -oxygen per protocol -benzos per protocol -no labs, daily VS only -    DVT prophylaxis: none  Code Status: dnr  Family Communication: daughter and wife at bedside  Disposition Plan: hospice care  Consults called: none  Admission status: obs    Radene Gunning MD [facility]  If 7PM-7AM, please contact night-coverage [email or web address] Password [email or web address]  02/20/2017, 2:03 PM

## 2017-02-20 NOTE — ED Notes (Signed)
Attempted report x1. 

## 2017-02-20 NOTE — ED Notes (Addendum)
Family refuse IV insertion at this time

## 2017-02-20 NOTE — ED Provider Notes (Signed)
[place] EMERGENCY DEPARTMENT Provider Note   CSN: [id number] Arrival date & time: 02/20/17  1013     History   Chief Complaint Chief Complaint  Patient presents with  . Shortness of Breath    HPI [name] is a 66 y.o. male.  Patient is a 66 year old male with multiple medical problems including hypertension, polio with resultant paralysis, recurrent recent hospitalizations for sepsis, aspiration pneumonia and healthcare associated pneumonia returning today with 2 days of worsening shortness of breath and altered mental status.  Patient was last discharged from the hospital on 02/12/2017.  At that time he was treated for healthcare associated pneumonia however lengthy discussions were had about ongoing care in the future.  Patient was seen by hospice and palliative care and at that time he filled out a most form which stated no IV antibiotics, no IV fluids comfort care only.  He did go home with a short course of oral antibiotics.  Family is not present currently to give further history.  Patient is not awake or alert to answer questions.   The history is provided by the patient. The history is limited by the condition of the patient and the absence of a caregiver.    Past Medical History:  Diagnosis Date  . Hypertension   . Polio   . Stroke [facility])     Patient Active Problem List   Diagnosis Date Noted  . HCAP (healthcare-associated pneumonia) 02/08/2017  . Severe protein-calorie malnutrition ([place]) 02/08/2017  . Pressure ulcer of coccygeal region, stage 3 ([place]) 02/08/2017  . Pressure injury of skin 01/18/2017  . Encounter for palliative care   . Goals of care, counseling/discussion   . CAP (community acquired pneumonia) 01/15/2017  . Acute respiratory failure with hypoxia ([place]) 01/15/2017  . Sepsis ([place]) 01/15/2017  . Lactic acidosis 01/15/2017  . Hypokalemia 01/15/2017  . HLD (hyperlipidemia) 01/15/2017  . Prolonged QT interval 01/15/2017  .  Contracture of ankle and foot joint 06/16/2015  . Dry eyes 06/16/2015  . Nontraumatic subcortical hemorrhage of right cerebral hemisphere ([place]) 06/16/2015  . Essential hypertension 11/19/2014  . PBA (pseudobulbar affect) 05/09/2014  . Nontraumatic subcortical hemorrhage of cerebral hemisphere ([place]) 05/09/2014  . Hypertensive emergency 05/09/2014  . Quadriplegia and quadriparesis ([place]) 12/31/2013  . Tendinitis of left ankle 09/14/2013  . Arthritis of ankle, left, degenerative 09/14/2013  . Gout attack 09/14/2013  . Dehydration 09/14/2013  . Depression with anxiety 09/14/2013  . 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 Acute embolic stroke ([place]) 95/62/1308  . ICH (intracerebral hemorrhage) ([place]) 08/22/2013  . Stroke [facility]) 08/22/2013    Past Surgical History:  Procedure Laterality Date  . NO PAST SURGERIES         Home Medications    Prior to Admission medications   Medication Sig Start Date End Date Taking? Authorizing Provider  amLODipine (NORVASC) 10 MG tablet Take 1 tablet (10 mg total) by mouth daily. 06/16/15   Rosalin Hawking, MD  aspirin EC 81 MG tablet Take 1 tablet (81 mg total) by mouth daily. 01/02/14   Rosalin Hawking, MD  atorvastatin (LIPITOR) 10 MG tablet Take 10 mg by mouth daily. 11/15/16   [provider]  cloNIDine (CATAPRES) 0.1 MG tablet Take 1 tablet (0.1 mg total) by mouth 3 (three) times daily. 06/16/15   Rosalin Hawking, MD  guaiFENesin (MUCINEX) 600 MG 12 hr tablet Take 600 mg by mouth 2 (two) times daily as needed for to loosen phlegm.    [provider]  ipratropium-albuterol (DUONEB) 0.5-2.5 (3) MG/3ML SOLN Take 3 mLs by nebulization 3 (three) times daily. 01/20/17   Hosie Poisson, MD  losartan (COZAAR) 100 MG tablet Take 0.5 tablets (50 mg total) by mouth daily. 06/16/15   Rosalin Hawking, MD  Maltodextrin-Xanthan Gum ([place]) POWD Use with every meal. 01/20/17   Hosie Poisson, MD  naproxen (NAPROSYN) 500 MG tablet Take 500 mg by  mouth 2 (two) times daily with a meal.    [provider]  NEXIUM 40 MG capsule Take 40 mg by mouth daily.  10/24/14   [provider]  predniSONE (DELTASONE) 20 MG tablet Take 2 tablets daily for 3 days, then take 1 tablet once daily for 3 days, then STOP 02/12/17   Bonnielee Haff, MD  sertraline (ZOLOFT) 100 MG tablet Take 100 mg by mouth daily.    [provider]    Family History Family History  Problem Relation Age of Onset  . Hypertension Mother   . Hypertension Father     Social History Social History   Tobacco Use  . Smoking status: Former Smoker    Types: Cigarettes  . Smokeless tobacco: Never Used  Substance Use Topics  . Alcohol use: No    Alcohol/week: 0.0 oz  . Drug use: No     Allergies   Patient has no known allergies.   Review of Systems Review of Systems  Unable to perform ROS: Mental status change     Physical Exam Updated Vital Signs Wt 39.9 kg (88 lb)   SpO2 (!) 88%   BMI 16.10 kg/m   Physical Exam  Constitutional: He appears well-developed. He appears toxic. He appears distressed.  Cardiovascular: Normal heart sounds. Tachycardia present.  Pulmonary/Chest: Accessory muscle usage present. Tachypnea noted. He is in respiratory distress. He has rhonchi.  Abdominal: Soft. Bowel sounds are normal.  Musculoskeletal:  Contractures of all ext with nonpitting edema in the RUE and lower ext  Neurological:  Altered and not responsive.  Pupils mildly dilated  Skin: Skin is warm.  Psychiatric:  Unresponsive.  Nursing note and vitals reviewed.    ED Treatments / Results  Labs (all labs ordered are listed, but only abnormal results are displayed) Labs Reviewed - No data to display  EKG  EKG Interpretation None       Radiology No results found.  Procedures Procedures (including critical care time)  Medications Ordered in ED Medications  morphine 4 MG/ML injection 4 mg (not administered)     Initial  Impression / Assessment and Plan / ED Course  I have reviewed the triage vital signs and the nursing notes.  Pertinent labs & imaging results that were available during my care of the patient were reviewed by me and considered in my medical decision making (see chart for details).     Patient presenting as a hospice patient.  Patient with multiple recent hospitalizations for aspiration and healthcare associated pneumonia.  Patient was last discharged approximately 1 week ago.  When patient went home he filled out a most form which stated no IV antibiotics or IV fluids.  DNR and DNI.  She is returning today and currently there is no family present but he appears to potentially be septic again with pneumonia.  He is hypoxic, tachypneic, tachycardic and unresponsive.  Based on patient's most form and most recent hospitalizations will provide comfort measures until family arrives.  Patient was given IM morphine.  During last hospitalization they discussed PEG tube which patient declined.  12:34 PM Spoke with [facility].  Discussed patient's status and situation.  Have also spoke with the family who has arrived.  Patient was given another dose of morphine for comfort.  They were calling the nurse and social work to see if there was a hospice center he could go to for end-of-life care.   1:41 PM Is no room at the hospice houses for end-of-life care.  Discussing with family they would prefer he stay here for end-of-life treatment. Final Clinical Impressions(s) / ED Diagnoses   Final diagnoses:  Acute on chronic respiratory failure with hypoxia [facility])  Admission for end of life care    ED Discharge Orders    None       Blanchie Dessert, MD 02/20/17 1348

## 2017-02-21 DIAGNOSIS — Z515 Encounter for palliative care: Secondary | ICD-10-CM

## 2017-02-21 MED ORDER — MORPHINE SULFATE (CONCENTRATE) 10 MG/0.5ML PO SOLN: 5.0000 mg | ORAL | Status: DC | PRN

## 2017-02-21 MED ORDER — MORPHINE SULFATE (CONCENTRATE) 10 MG/0.5ML PO SOLN
5.0000 mg | ORAL | Status: DC | PRN
  Administered 2017-02-21 (×3): 5 mg via SUBLINGUAL
  Filled 2017-02-21 (×3): qty 0.5

## 2017-02-21 MED ORDER — MORPHINE SULFATE (CONCENTRATE) 10 MG/0.5ML PO SOLN: 5.0000 mg | ORAL | Status: AC | PRN

## 2017-02-21 MED ORDER — LORAZEPAM 1 MG PO TABS: 1.0000 mg | ORAL_TABLET | ORAL | 0 refills | Status: AC | PRN

## 2017-02-21 MED ORDER — GLYCOPYRROLATE 0.2 MG/ML IJ SOLN: 0.2000 mg | INTRAMUSCULAR | Status: AC | PRN

## 2017-02-21 MED ORDER — HALOPERIDOL LACTATE 2 MG/ML PO CONC: 0.5000 mg | ORAL | 0 refills | Status: AC | PRN

## 2017-02-21 MED ORDER — POLYVINYL ALCOHOL 1.4 % OP SOLN: 1.0000 [drp] | Freq: Four times a day (QID) | OPHTHALMIC | 0 refills | Status: AC | PRN

## 2017-02-21 NOTE — Progress Notes (Signed)
Nutrition Brief Note  Chart reviewed. Pt now transitioning to comfort care.  No further nutrition interventions warranted at this time.  Please re-consult as needed.   Jaegar Croft A. Shailyn Weyandt, RD, LDN, CDE Pager: [phone] After hours Pager: [phone]  

## 2017-02-21 NOTE — H&P (Addendum)
[facility] RN Visit @ 0930  Family called EMS for patients increasing shortness of breath. [facility] was not notified.   This is a related and covered GIP admission of 11/11/208 with [facility] diagnosis of Cerebrovascular disease per Dr Tomasa Hosteller . Code status DNR.   Visited patient at bedside. No family present. Patient did not respond to my voice. He is on non-rebreather mask at 15L. Follow catheter no output noted in bag. Edema to right hand and forearm.    No continuous medications or antibiotics ordered. No PRNs have been administered.  ?  Dr. Tomasa Hosteller and Dr. Inda Merlin notified of admission. Transfer Summary and medication list placed on shadow chart.   [facility] will continue to follow while hospitalized.  Patient has been accepted to transfer to [facility]. Please use [facility] for transport as this patient is already under [facility] services.   Thank you,  Farrel Gordon RN [facility] Liaison  [place] are on [facility]

## 2017-02-21 NOTE — Progress Notes (Signed)
Patient will discharge to [facility] Anticipated discharge date: 11/12 Family notified: at bedside Transportation by [facility]  CSW signing off.  Jorge Ny, LCSW Clinical Social Worker [phone]

## 2017-02-21 NOTE — Progress Notes (Signed)
Pt new admit from ED, flat affect, on non rebreather mask, contracted upper and lower extremities, edema +2 upper arm, no family at the bedside, for comfort care, 97% o2sat, applied pink foam to sacral area , oral care rendered.

## 2017-02-21 NOTE — Discharge Summary (Signed)
Physician Discharge Summary  [name] BDZ:[id number] DOB: 24-Jul-1950 DOA: 02/20/2017  PCP: Josetta Huddle, MD  Admit date: 02/20/2017 Discharge date: 03/02/2017   Recommendations for Outpatient Follow-Up:   Residential hospice  Discharge Diagnosis:   Principal Problem:   End of life care   Discharge disposition:  [facility]  Discharge Condition: terminal  Diet recommendation:   Wound care: None.   History of Present Illness:   [name] is a 66 y.o. male with medical history significant for hypertension, polio with paralysis, aspiration pneumonia, bacteremia, stroke, severe protein calorie malnutrition since emergency Department chief complaint worsening shortness of breath. So evaluation confirms likely end-of-life hospice. [facility] asked to admit  Information is obtained from the chart and the family and the hospice social worker. Wife reports patient has been home 1 week from the hospital after being treated for pneumonia. He did well for 5 days and then 2 days ago he developed worsening shortness of breath. Associated symptoms include increased lethargy decreased mentation decreased oral intake. During his last hospitalization he was consulted with pouch of care and hospice. At that hospitalization it was decided goals of care were no IV antibiotics no IV fluids and comfort care only.     Hospital Course by Problem:   End of life care.  -Patient recently discharged from hospital to home hospice. He had multiple recent hospitalization for aspiration and healthcare associated pneumonia. Most recent discharge was 1 week ago. Last hospitalization patient/family filled out MOST form and decided comfort care only best. He was brought back to the hospital today for increased work of breathing and lethargy. Discussed with home hospice triage nurse and hospice social work who relay that there is no hospice bed available at this time. Hopefully that it became Place  will become available tomorrow. Will admit for observation for comfort care      Medical Consultants:    None.   Discharge Exam:   Vitals:   02/20/17 2200 02/20/17 2237  BP: 123/76 (!) 99/54  Pulse: (!) 106 (!) 105  Resp: (!) 29 (!) 25  Temp:  98.4 F (36.9 C)  SpO2: 98% 97%   Vitals:   02/20/17 2130 02/20/17 2145 02/20/17 2200 02/20/17 2237  BP: 120/79 118/73 123/76 (!) 99/54  Pulse: (!) 107 (!) 106 (!) 106 (!) 105  Resp: (!) 28 (!) 28 (!) 29 (!) 25  Temp:    98.4 F (36.9 C)  TempSrc:    Axillary  SpO2: 96% 96% 98% 97%  Weight:        Gen:  NAD   The results of significant diagnostics from this hospitalization (including imaging, microbiology, ancillary and laboratory) are listed below for reference.     Procedures and Diagnostic Studies:   No results found.   Labs:   Basic Metabolic Panel: No results for input(s): NA, K, CL, CO2, GLUCOSE, BUN, CREATININE, CALCIUM, MG, PHOS in the last 168 hours. GFR Estimated Creatinine Clearance: 52 mL/min (A) (by C-G formula based on SCr of 0.42 mg/dL (L)). Liver Function Tests: No results for input(s): AST, ALT, ALKPHOS, BILITOT, PROT, ALBUMIN in the last 168 hours. No results for input(s): LIPASE, AMYLASE in the last 168 hours. No results for input(s): AMMONIA in the last 168 hours. Coagulation profile No results for input(s): INR, PROTIME in the last 168 hours.  CBC: No results for input(s): WBC, NEUTROABS, HGB, HCT, MCV, PLT in the last 168 hours. Cardiac Enzymes: No results for input(s): CKTOTAL, CKMB, CKMBINDEX, TROPONINI in  the last 168 hours. BNP: Invalid input(s): POCBNP CBG: No results for input(s): GLUCAP in the last 168 hours. D-Dimer No results for input(s): DDIMER in the last 72 hours. Hgb A1c No results for input(s): HGBA1C in the last 72 hours. Lipid Profile No results for input(s): CHOL, HDL, LDLCALC, TRIG, CHOLHDL, LDLDIRECT in the last 72 hours. Thyroid function studies No results for  input(s): TSH, T4TOTAL, T3FREE, THYROIDAB in the last 72 hours.  Invalid input(s): FREET3 Anemia work up No results for input(s): VITAMINB12, FOLATE, FERRITIN, TIBC, IRON, RETICCTPCT in the last 72 hours. Microbiology No results found for this or any previous visit (from the past 240 hour(s)).   Discharge Instructions:    Allergies as of 03/10/2017   No Known Allergies     Medication List    STOP taking these medications   amLODipine 10 MG tablet Commonly known as:  NORVASC   aspirin EC 81 MG tablet   atorvastatin 10 MG tablet Commonly known as:  LIPITOR   cloNIDine 0.1 MG tablet Commonly known as:  CATAPRES   guaifenesin 100 MG/5ML syrup Commonly known as:  ROBITUSSIN   ipratropium-albuterol 0.5-2.5 (3) MG/3ML Soln Commonly known as:  DUONEB   losartan 100 MG tablet Commonly known as:  COZAAR   mirtazapine 30 MG tablet Commonly known as:  REMERON   naproxen 500 MG tablet Commonly known as:  NAPROSYN   NEXIUM 40 MG capsule Generic drug:  esomeprazole   predniSONE 20 MG tablet Commonly known as:  DELTASONE   RESOURCE THICKENUP CLEAR Powd   senna 8.6 MG tablet Commonly known as:  SENOKOT   sertraline 100 MG tablet Commonly known as:  ZOLOFT     TAKE these medications   glycopyrrolate 0.2 MG/ML injection Commonly known as:  ROBINUL Inject 1 mL (0.2 mg total) every 4 (four) hours as needed into the skin (excessive secretions).   haloperidol 2 MG/ML solution Commonly known as:  HALDOL Place 0.3 mLs (0.6 mg total) every 4 (four) hours as needed under the tongue for agitation (or delirium).   LORazepam 1 MG tablet Commonly known as:  ATIVAN Take 1 tablet (1 mg total) every 4 (four) hours as needed by mouth for anxiety.   morphine CONCENTRATE 10 MG/0.5ML Soln concentrated solution Place 0.25 mLs (5 mg total) every hour as needed under the tongue for moderate pain (or dyspnea).   polyvinyl alcohol 1.4 % ophthalmic solution Commonly known as:   LIQUIFILM TEARS Place 1 drop 4 (four) times daily as needed into both eyes for dry eyes.         Time coordinating discharge: 35 min  Signed:  JESSICA U VANN   [facility] 02/22/2017, 10:49 AM

## 2017-02-21 NOTE — Progress Notes (Signed)
Home Care SW made visit to hospital to get financial assessment completed before Patient transfers to [facility] today.  Patient was not responsive and family was present.  Wife and Daughter were tearful and grieving appropriately.  Financial assessment completed and SW let RN Liaison know Patient was ready to go.  Cecilio Asper, [place] [facility] [place]

## 2017-02-21 NOTE — Progress Notes (Signed)
Pt discharged to [facility].  Transported by [facility].  Pt was unresponsive.  Pt on 100% non rebreather during transport. Report given to [facility].

## 2017-03-12 DEATH — deceased

## 2019-04-09 IMAGING — CT CT ANGIO CHEST
2 of 6 series · 18 of 36 positions shown · IV contrast (ISOVUE 370)
Comparison: CXR 01/15/2017 and 12/17/2013

CLINICAL DATA: Malaise times several days with dyspnea productive
cough. Rhonchi.

EXAM:
CT ANGIOGRAPHY CHEST WITH CONTRAST
TECHNIQUE: Multidetector CT imaging of the chest was performed using the
standard protocol during bolus administration of intravenous
contrast. Multiplanar CT image reconstructions and MIPs were
obtained to evaluate the vascular anatomy.
CONTRAST:  70 cc Isovue 370 IV

[Series 5: coronal mpr · coronal · 0.46mm/px · 1 of 136 slices shown]
[im 68/136  mediastinal]
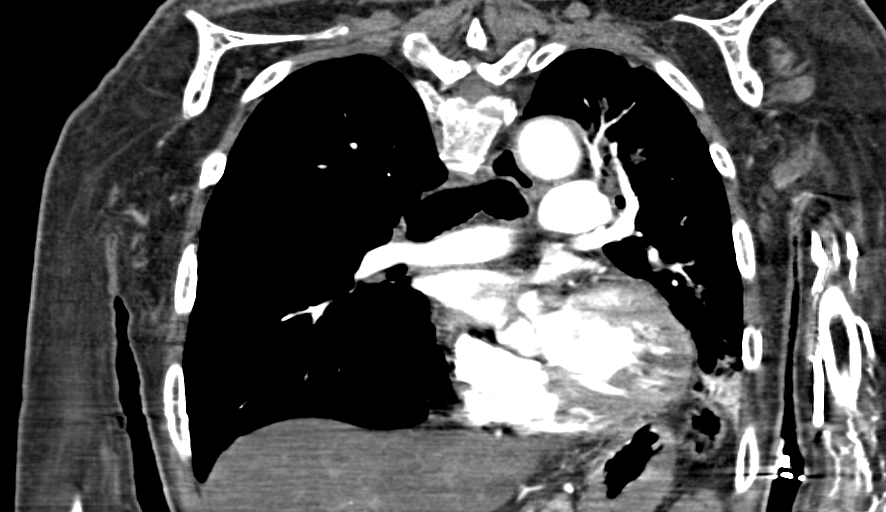

[Series 10: thins for pacs · axial · 0.74mm/px · z∈[+1389,+1604]mm · 17 of 239 slices shown]
[im 12/239  lung]
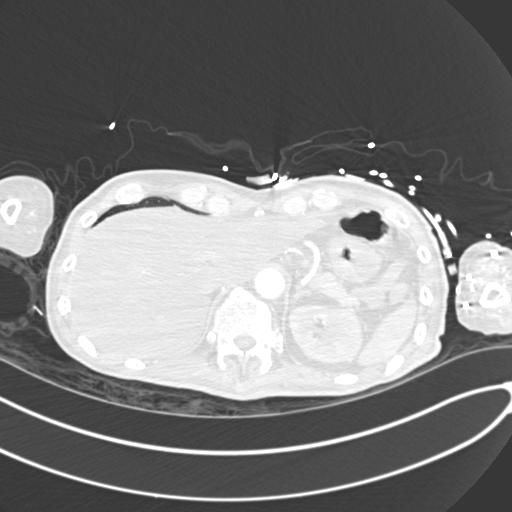
[im 24/239  mediastinal]
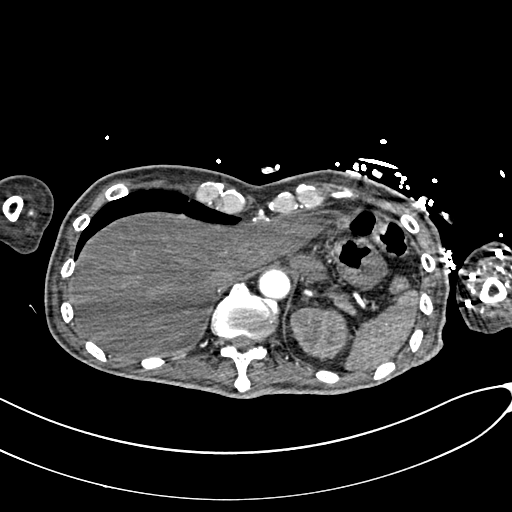
[im 36/239  lung]
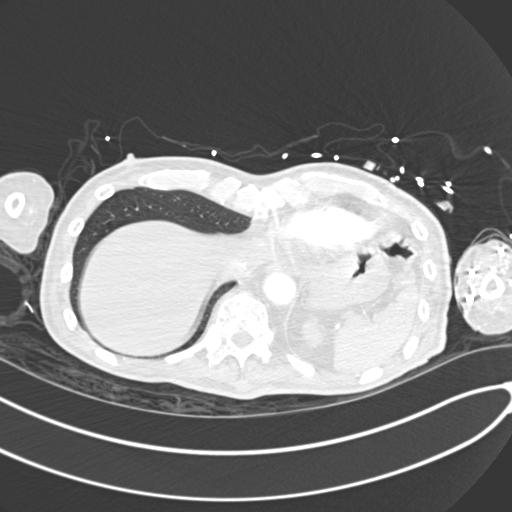
[im 48/239  mediastinal]
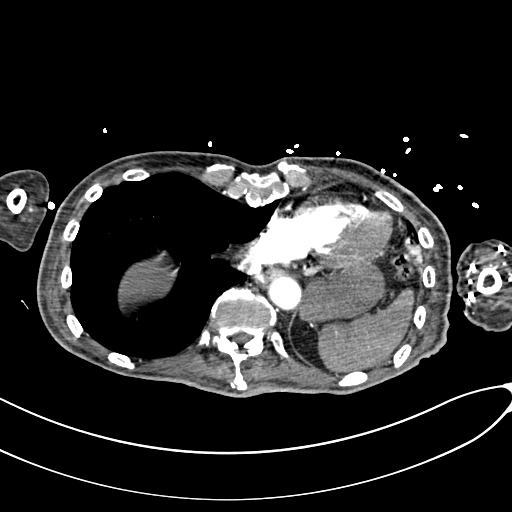
[im 72/239  lung]
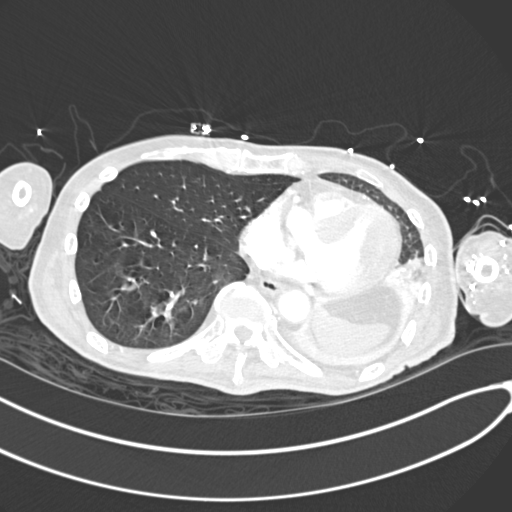
[im 84/239  mediastinal]
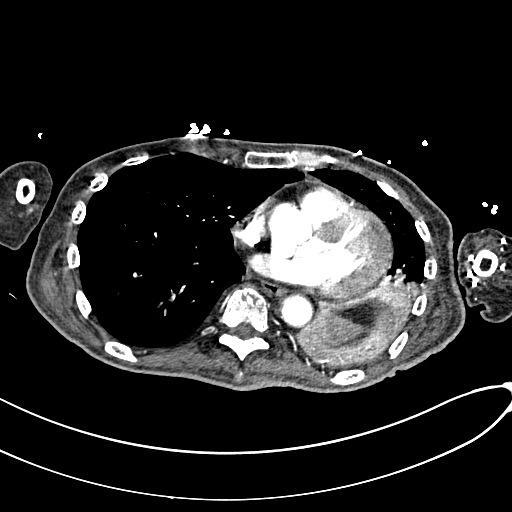
[im 96/239  lung]
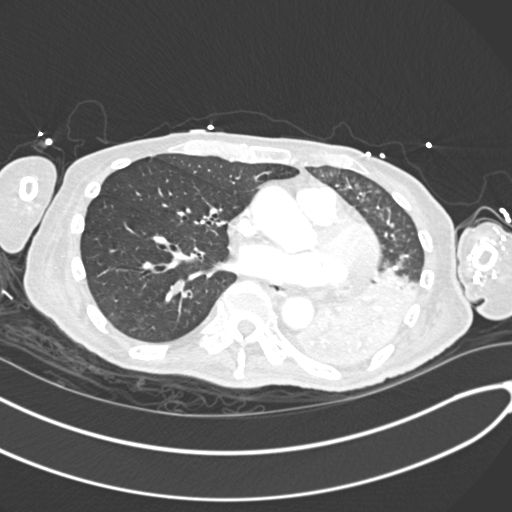
[im 108/239  mediastinal]
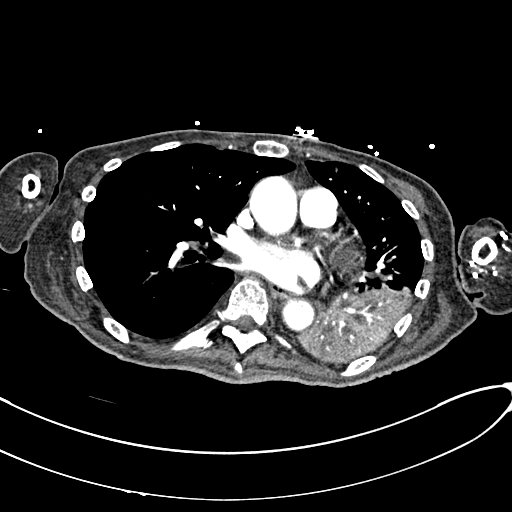
[im 120/239  lung]
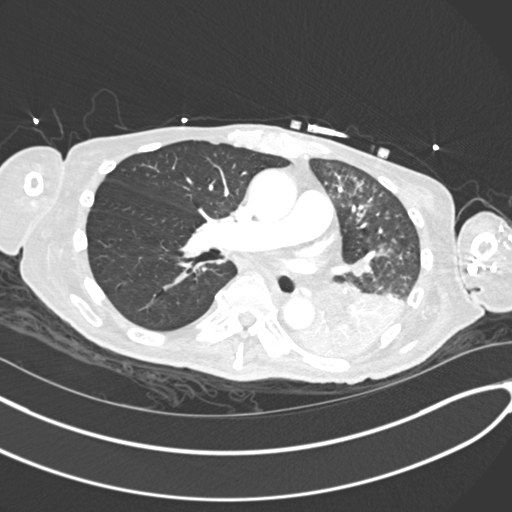
[im 131/239  mediastinal]
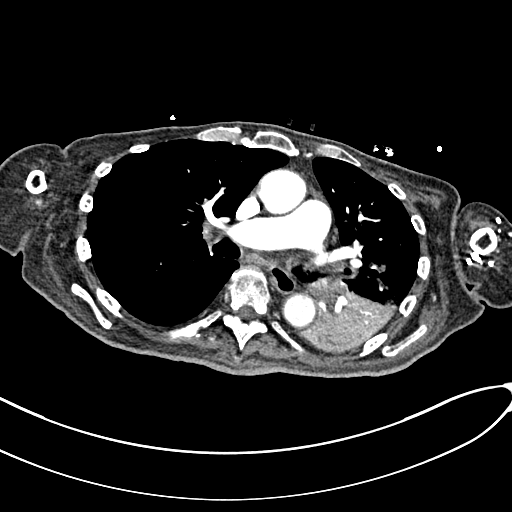
[im 143/239  lung]
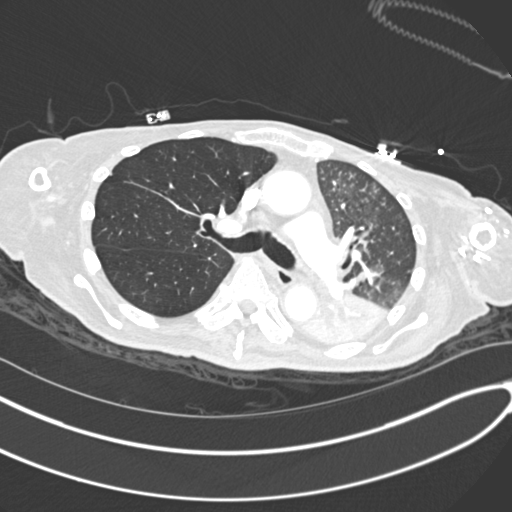
[im 155/239  mediastinal]
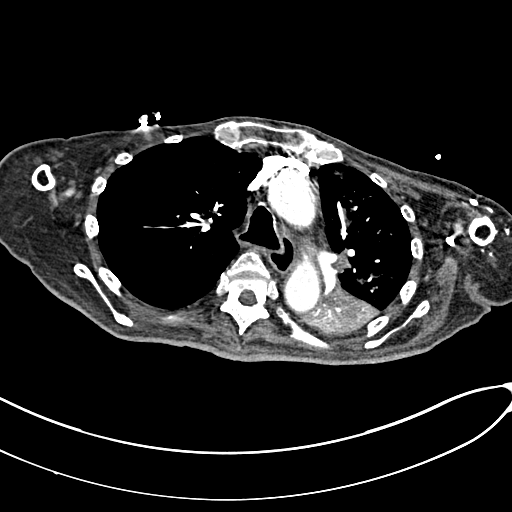
[im 167/239  lung]
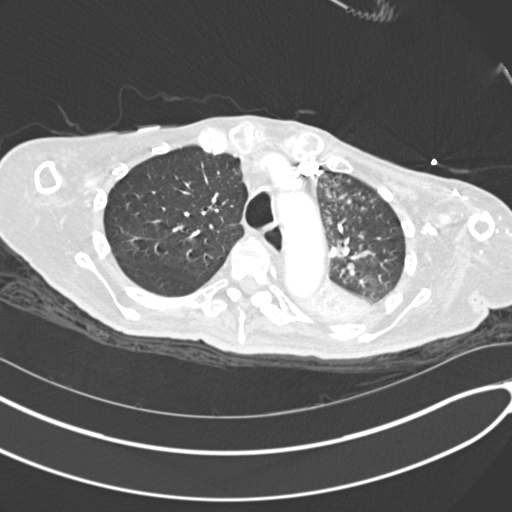
[im 191/239  mediastinal]
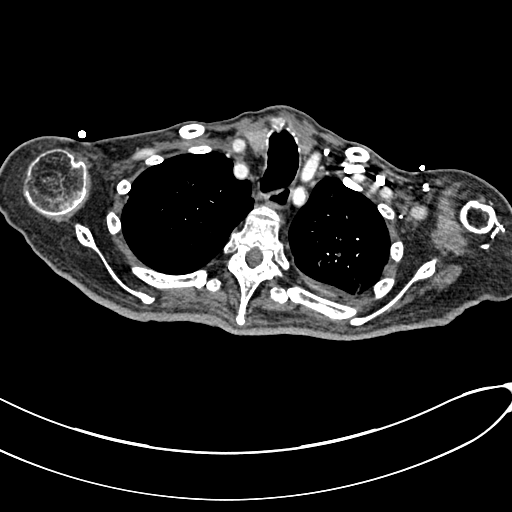
[im 203/239  lung]
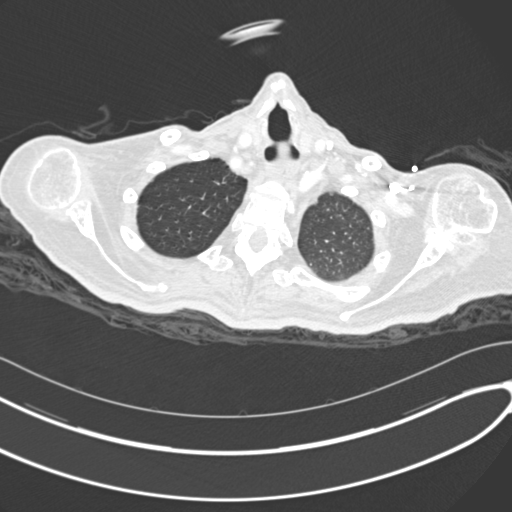
[im 215/239  mediastinal]
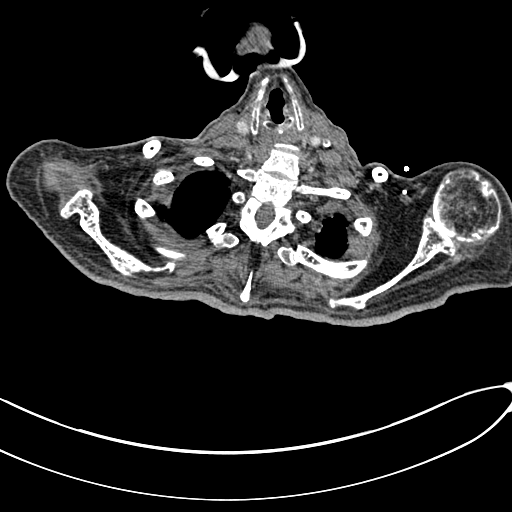
[im 227/239  lung]
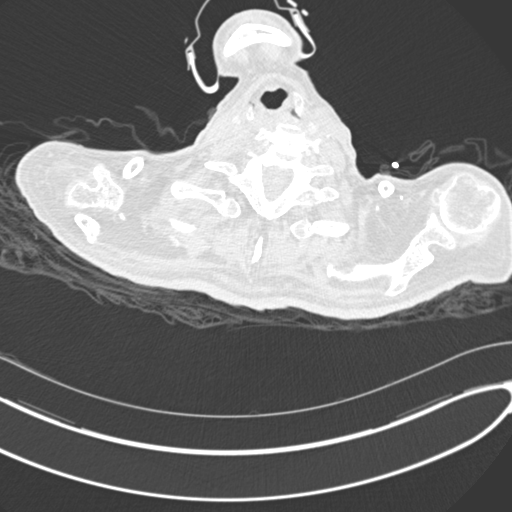

[18 of 36 positions shown; findings below may reference images not displayed]

FINDINGS: Cardiovascular: Heart size is normal. There is no pericardial
effusion. No acute pulmonary embolus to the segmental level. The
aorta is normal in caliber with minimal aortic atherosclerosis. No
dissection.

Mediastinum/Nodes: Small precarinal and right hilar subcentimeter
short axis lymph nodes are noted. No axillary or supraclavicular
lymphadenopathy. The trachea and mainstem bronchi are patent however
there are finger-like soft tissue densities within the lobar,
segmental and subsegmental branches to the left lower lobe.

Lungs/Pleura: Left lower lobe atelectasis due to soft tissue
densities within lobar, segmental and subsegmental branches to the
left lower lobe. Peribronchial thickening is noted to both lower
lobes and lingula. Nonspecific scattered nodular infiltrates are
identified to the left upper lobe and lingula.

Upper Abdomen: The liver is hypodense in appearance consistent with
hepatic steatosis. No acute upper abdominal abnormality.

Musculoskeletal: The bones are demineralized in appearance. No acute
nor suspicious osseous abnormalities.

Review of the MIP images confirms the above findings.
IMPRESSION: 1. Left lower lobe atelectasis due to endobronchial soft tissue
densities noted of left lower lobe bronchi with differential
possibilities more commonly would include mucous plugging though
endobronchial lesions are not entirely excluded.
2. Scattered nodular infiltrates in the left upper lobe and lingula,
nonspecific possibly related to community-acquired pneumonia such as
mycoplasma, fungal infection, or granulomatous disease potentially.
Followup PA and lateral chest X-ray is recommended in 3-4 weeks
following trial of antibiotic therapy to ensure resolution and
exclude underlying malignancy.
3. No acute pulmonary embolus.

Aortic Atherosclerosis ([id number]-[id number].S).
